# Patient Record
Sex: Male | Born: 1935 | Race: White | Hispanic: No | Marital: Married | Smoking: Former smoker
Health system: Southern US, Community
[De-identification: ages and names within clinical notes are randomized; demographics above are authoritative.]

## PROBLEM LIST (undated history)

## (undated) DIAGNOSIS — R42 Dizziness and giddiness: Secondary | ICD-10-CM

## (undated) DIAGNOSIS — E785 Hyperlipidemia, unspecified: Secondary | ICD-10-CM

## (undated) DIAGNOSIS — C801 Malignant (primary) neoplasm, unspecified: Secondary | ICD-10-CM

## (undated) DIAGNOSIS — I2510 Atherosclerotic heart disease of native coronary artery without angina pectoris: Secondary | ICD-10-CM

## (undated) DIAGNOSIS — I6529 Occlusion and stenosis of unspecified carotid artery: Secondary | ICD-10-CM

## (undated) DIAGNOSIS — H409 Unspecified glaucoma: Secondary | ICD-10-CM

## (undated) DIAGNOSIS — G4700 Insomnia, unspecified: Secondary | ICD-10-CM

## (undated) DIAGNOSIS — I10 Essential (primary) hypertension: Secondary | ICD-10-CM

## (undated) HISTORY — DX: Essential (primary) hypertension: I10

## (undated) HISTORY — PX: CORONARY ANGIOPLASTY WITH STENT PLACEMENT: SHX49

## (undated) HISTORY — PX: COLONOSCOPY: SHX5424

## (undated) HISTORY — DX: Malignant (primary) neoplasm, unspecified: C80.1

## (undated) HISTORY — DX: Hyperlipidemia, unspecified: E78.5

## (undated) HISTORY — DX: Insomnia, unspecified: G47.00

## (undated) HISTORY — DX: Atherosclerotic heart disease of native coronary artery without angina pectoris: I25.10

## (undated) HISTORY — PX: MELANOMA EXCISION: SHX5266

## (undated) HISTORY — DX: Occlusion and stenosis of unspecified carotid artery: I65.29

---

## 2013-12-02 LAB — TSH: TSH: 1.62 u[IU]/mL (ref 0.41–5.90)

## 2013-12-02 LAB — HEPATIC FUNCTION PANEL
ALT: 15 U/L (ref 10–40)
AST: 15 U/L (ref 14–40)

## 2013-12-02 LAB — BASIC METABOLIC PANEL
Creatinine: 1.4 mg/dL — AB (ref 0.6–1.3)
Glucose: 105 mg/dL
Potassium: 4.9 mmol/L (ref 3.4–5.3)

## 2013-12-02 LAB — CBC AND DIFFERENTIAL: WBC: 7 10^3/mL

## 2013-12-02 LAB — LIPID PANEL
CHOLESTEROL: 160 mg/dL (ref 0–200)
HDL: 33 mg/dL — AB (ref 35–70)
LDL Cholesterol: 79 mg/dL

## 2014-01-04 ENCOUNTER — Encounter: Payer: Self-pay | Admitting: Family Medicine

## 2014-01-04 ENCOUNTER — Ambulatory Visit (INDEPENDENT_AMBULATORY_CARE_PROVIDER_SITE_OTHER): Payer: BC Managed Care – PPO | Admitting: Family Medicine

## 2014-01-04 VITALS — BP 139/69 | HR 60 | Ht 72.0 in | Wt 188.0 lb

## 2014-01-04 DIAGNOSIS — Z23 Encounter for immunization: Secondary | ICD-10-CM

## 2014-01-04 DIAGNOSIS — I150 Renovascular hypertension: Secondary | ICD-10-CM | POA: Insufficient documentation

## 2014-01-04 DIAGNOSIS — I2510 Atherosclerotic heart disease of native coronary artery without angina pectoris: Secondary | ICD-10-CM

## 2014-01-04 DIAGNOSIS — G4700 Insomnia, unspecified: Secondary | ICD-10-CM

## 2014-01-04 DIAGNOSIS — G40909 Epilepsy, unspecified, not intractable, without status epilepticus: Secondary | ICD-10-CM

## 2014-01-04 DIAGNOSIS — I6529 Occlusion and stenosis of unspecified carotid artery: Secondary | ICD-10-CM | POA: Insufficient documentation

## 2014-01-04 DIAGNOSIS — I10 Essential (primary) hypertension: Secondary | ICD-10-CM

## 2014-01-04 DIAGNOSIS — E785 Hyperlipidemia, unspecified: Secondary | ICD-10-CM

## 2014-01-04 HISTORY — DX: Atherosclerotic heart disease of native coronary artery without angina pectoris: I25.10

## 2014-01-04 HISTORY — DX: Insomnia, unspecified: G47.00

## 2014-01-04 HISTORY — DX: Occlusion and stenosis of unspecified carotid artery: I65.29

## 2014-01-04 HISTORY — DX: Hyperlipidemia, unspecified: E78.5

## 2014-01-04 HISTORY — DX: Essential (primary) hypertension: I10

## 2014-01-04 NOTE — Progress Notes (Signed)
CC: Brad James is a 80 y.o. 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 He states he has never had a myocardial infarction. He takes a statin, metoprolol, and Plavix. He denies any exertional chest pain. He most recently saw his cardiologist within the last one to 2 months. Denies limb claudication. He tells that cholesterol was checked this last month and was considered controlled  Reports a history of carotid artery stenosis bilaterally. He tells me within the last month he has had an ultrasound his carotid arteries and he is not considered a surgical candidate. He denies any recent or remote motor or sensory disturbances other than a passing out episode described below  He tells me that sometime in the 1950s he had an episode of passing out a disposition at that time concluded that he suffered from epilepsy. He was started on Dilantin and phenobarbital. It sounds like he has had EEG studies which confirmed epilepsy however years ago his physician stopped Dilantin and he has only been taking phenobarbital without any recent or remote epileptic activity. He tells me that his phenobarbital level was checked within the last month, was therapeutic, and that this is checked annually.  History of insomnia which is currently controlled with as needed use of Ambien without any noted side effects  History of hypertension currently taking metoprolol and lisinopril he tells me that kidney function was checked within the last month he is uncertain about specific results.  Review of Systems - General ROS: negative for - chills, fever, night sweats, weight gain or weight loss Ophthalmic ROS: negative for - decreased vision Psychological ROS: negative for - anxiety or depression ENT ROS: negative for - hearing change, nasal congestion, tinnitus or  allergies Hematological and Lymphatic ROS: negative for - bleeding problems, bruising or swollen lymph nodes Breast ROS: negative Respiratory ROS: no cough, shortness of breath, or wheezing Cardiovascular ROS: no chest pain or dyspnea on exertion Gastrointestinal ROS: no abdominal pain, change in bowel habits, or black or bloody stools Genito-Urinary ROS: negative for - genital discharge, genital ulcers, incontinence or abnormal bleeding from genitals Musculoskeletal ROS: negative for - joint pain or muscle pain Neurological ROS: negative for - headaches or memory loss Dermatological ROS: negative for lumps, mole changes, rash and skin lesion changes  Past Medical History  Diagnosis Date  . Hypertension   . CAD (coronary artery disease) 01/04/2014  . Carotid artery stenosis 01/04/2014  . Essential hypertension, benign 01/04/2014  . Hyperlipidemia 01/04/2014  . Insomnia 01/04/2014    History reviewed. No pertinent past surgical history. History reviewed. No pertinent family history.  History   Social History  . Marital Status: Married    Spouse Name: N/A    Number of Children: N/A  . Years of Education: N/A   Occupational History  . Not on file.   Social History Main Topics  . Smoking status: Former Smoker    Quit date: 01/04/1969  . Smokeless tobacco: Not on file  . Alcohol Use: No  . Drug Use: No  . Sexual Activity: Not Currently    Partners: Female   Other Topics Concern  . Not on file   Social History Narrative  . No narrative on file     Objective: BP 139/69  Pulse 60  Ht 6' (1.829 m)  Wt 188 lb (85.276 kg)  BMI  25.49 kg/m2  General: Alert and Oriented, No Acute Distress HEENT: Pupils equal, round, reactive to light. Conjunctivae clear. Moist mucous membranes pharynx unremarkable Lungs: Clear to auscultation bilaterally, no wheezing/ronchi/rales.  Comfortable work of breathing. Good air movement. Cardiac: Regular rate and rhythm. Normal S1/S2.  No murmurs,  rubs, nor gallops.  Bilateral carotid bruit Extremities: No peripheral edema.  Strong peripheral pulses.  Mental Status: No depression, anxiety, nor agitation. Skin: Warm and dry.  Assessment & Plan: Brad James was seen today for establish care.  Diagnoses and associated orders for this visit:  Need for prophylactic vaccination against Streptococcus pneumoniae (pneumococcus) - Pneumococcal conjugate vaccine 13-valent  CAD (coronary artery disease)  Essential hypertension, benign  Hyperlipidemia  Insomnia  Carotid artery stenosis  Epilepsy    Coronary artery disease: Stable, continue Plavix, aspirin, statin, beta blocker. Essential hypertension: Controlled continue beta blocker and amlodipine Hyperlipidemia: Clinically controlled, we have requested his most recent cholesterol panel that he believes was done within the last 2 months. Insomnia: Controlled continue as needed Ambien Epilepsy: Controlled continue phenobarbital, we've requested records of his most recent phenobarbital level that he believes was checked 2 months ago Carotid artery stenosis: Stable continue aspirin and Plavix, requesting outside records of most recent carotid ultrasound   Return in about 3 months (around 04/05/2014).

## 2014-02-22 ENCOUNTER — Encounter: Payer: Self-pay | Admitting: Family Medicine

## 2014-02-22 DIAGNOSIS — K551 Chronic vascular disorders of intestine: Secondary | ICD-10-CM | POA: Insufficient documentation

## 2014-02-22 DIAGNOSIS — Z85828 Personal history of other malignant neoplasm of skin: Secondary | ICD-10-CM | POA: Insufficient documentation

## 2014-02-22 DIAGNOSIS — I701 Atherosclerosis of renal artery: Secondary | ICD-10-CM | POA: Insufficient documentation

## 2014-02-22 DIAGNOSIS — K861 Other chronic pancreatitis: Secondary | ICD-10-CM | POA: Insufficient documentation

## 2014-02-22 DIAGNOSIS — H401130 Primary open-angle glaucoma, bilateral, stage unspecified: Secondary | ICD-10-CM | POA: Insufficient documentation

## 2014-02-22 DIAGNOSIS — I771 Stricture of artery: Secondary | ICD-10-CM

## 2014-02-24 ENCOUNTER — Encounter: Payer: Self-pay | Admitting: *Deleted

## 2014-02-24 LAB — PSA: PSA: 2.1

## 2014-06-07 ENCOUNTER — Encounter: Payer: Self-pay | Admitting: Family Medicine

## 2014-06-07 ENCOUNTER — Ambulatory Visit (INDEPENDENT_AMBULATORY_CARE_PROVIDER_SITE_OTHER): Payer: BC Managed Care – PPO | Admitting: Family Medicine

## 2014-06-07 VITALS — BP 147/70 | HR 66 | Wt 189.0 lb

## 2014-06-07 DIAGNOSIS — H029 Unspecified disorder of eyelid: Secondary | ICD-10-CM

## 2014-06-07 DIAGNOSIS — L570 Actinic keratosis: Secondary | ICD-10-CM

## 2014-06-07 DIAGNOSIS — L989 Disorder of the skin and subcutaneous tissue, unspecified: Secondary | ICD-10-CM

## 2014-06-07 NOTE — Progress Notes (Signed)
CC: Brad James is a 80 y.o. male is here for freeze lesions   Subjective: HPI:  Complains of multiple skin lesions localized in different parts of his body described below.  Lesions on the right face localized on the cheek that have been present for years he describes them as scabs that never fully healed. No interventions as of yet. Denies unintentional weight loss, swollen lymph nodes, but does have some skin lesions also are described below. Denies fevers, chills.  Complains of a lesion on the inferior right eyelid that has been present for matter of years. It was once partially removed by plastic surgery, he was scheduled to have further resection done however left his former provider's office in [place] prior to having this completed. He reports getting in the way of his vision.  Complains of flaking on the left shin described as a scab but never heals. It is painless. Occasionally bleeds it is rough with it.  Complains of a mass on his right wrist that has been growing over the past 2-3 years. It is painless but if he exposes it to friction and will frequently bleed. It seems to be growing on a annual basis. No interventions as of yet  Complaints of ulceration/mass on his chest just below the That has been present for the past year seems to be enlarging on a monthly basis without any pain it is itchy. No interventions as of yet  Review Of Systems Outlined In HPI  Past Medical History  Diagnosis Date  . Hypertension   . CAD (coronary artery disease) 01/04/2014  . Carotid artery stenosis 01/04/2014  . Essential hypertension, benign 01/04/2014  . Hyperlipidemia 01/04/2014  . Insomnia 01/04/2014    No past surgical history on file. No family history on file.  History   Social History  . Marital Status: Married    Spouse Name: N/A    Number of Children: N/A  . Years of Education: N/A   Occupational History  . Not on file.   Social History Main Topics  . Smoking status:  Former Smoker    Quit date: 01/04/1969  . Smokeless tobacco: Not on file  . Alcohol Use: No  . Drug Use: No  . Sexual Activity: Not Currently    Partners: Female   Other Topics Concern  . Not on file   Social History Narrative  . No narrative on file     Objective: BP 147/70  Pulse 66  Wt 189 lb (85.73 kg)  General: Alert and Oriented, No Acute Distress HEENT: Pupils equal, round, reactive to light. Conjunctivae clear.  Moist membranes pharynx unremarkable Lungs: Clear comfortable work of breathing Cardiac: Regular rate and rhythm.  Extremities: No peripheral edema.  Strong peripheral pulses.  Mental Status: No depression, anxiety, nor agitation. Skin: Warm and dry. On the right cheek he has 4 clusters of actinic keratoses.  On the left shin he also has a half centimeter and a quarter centimeter cluster of actinic keratosis. On the right wrist Radial aspect there is a 1 cm diameter slightly raised red and spongy mass, just above the sternum of the chest there is a half centimeter diameter slightly raised pearly fleshy colored mass   Assessment & Plan: Brad James was seen today for freeze lesions.  Diagnoses and associated orders for this visit:  Eyelid lesion - Ambulatory referral to Plastic Surgery  Actinic keratoses  Skin lesion of chest wall - Dermatology pathology  Arm skin lesion, right - Dermatology pathology  Referral to plastic surgery for further management of the recurrence of what looks to be a basal cell carcinoma on the right lower eyelid. Biopsies above for further evaluation of 2 suspicious lesions, all actinic keratoses were destructed with cryotherapy  Return if symptoms worsen or fail to improve.  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  The lesions and surrounding area were given a sterile prep using chlorhexidine and draped in the usual sterile fashion. A derma blade was used to shave an area of skin approximately 0.5cm by 0.5cm above the chest and the wrist.  Hemostasis achieved with pressure.  a sterile dressing applied.  The specimens were sent for pathologic examination. The patient tolerated the procedure well.  EBL: 3 ml  Findings: unremarkable  Condition: Stable  Complications: none.  Plan: 1. Instructed to keep the wound dry and covered for 24-48h and clean thereafter. 2. Warning signs of infection were reviewed.   3. Recommended that the patient use OTC analgesics as needed for pain.  4. ReturnPRN   Cryotherapy Procedure Note  Pre-operative Diagnosis: Actinic keratosis  Post-operative Diagnosis: Actinic keratosis  Locations: ON the right cheek and left shin  Indications: Destruction of precancerous lesions  Anesthesia: not required    Procedure Details  History of allergy to iodine: no. Pacemaker? no.  Patient informed of risks (permanent scarring, infection, light or dark discoloration, bleeding, infection, weakness, numbness and recurrence of the lesion) and benefits of the procedure and verbal informed consent obtained.  The areas are treated with liquid nitrogen therapy, frozen until ice ball extended 3 mm beyond lesion, allowed to thaw, and treated again. The patient tolerated procedure well.  The patient was instructed on post-op care, warned that there may be blister formation, redness and pain. Recommend OTC analgesia as needed for  pain.  Condition: Stable  Complications: none.  Plan: 1. Instructed to keep the area dry and covered for 24-48h and clean thereafter. 2. Warning signs of infection were reviewed.   3. Recommended that the patient use OTC analgesics as needed for pain.  4. Return PRN

## 2014-06-10 ENCOUNTER — Telehealth: Payer: Self-pay | Admitting: Family Medicine

## 2014-06-10 DIAGNOSIS — D099 Carcinoma in situ, unspecified: Secondary | ICD-10-CM

## 2014-06-10 DIAGNOSIS — C4491 Basal cell carcinoma of skin, unspecified: Secondary | ICD-10-CM

## 2014-06-10 NOTE — Telephone Encounter (Signed)
Seth Bake, Will you please let patient know that his biopsy of the chest and wrist reflected that the spots are a basal cell and squamous cell carcinoma respectively.  I'd recommend that he meet with a dermatologist to discuss whether destruction vs complete excision is the next best course of action.  I've placed a referral for this, if he's not been contacted by next week for scheduling please let me know.

## 2014-06-10 NOTE — Telephone Encounter (Signed)
Left message to call back for results

## 2014-06-11 NOTE — Telephone Encounter (Signed)
Pt.notified

## 2014-07-22 ENCOUNTER — Encounter: Payer: Self-pay | Admitting: Family Medicine

## 2014-07-22 DIAGNOSIS — C4490 Unspecified malignant neoplasm of skin, unspecified: Secondary | ICD-10-CM | POA: Insufficient documentation

## 2014-09-14 ENCOUNTER — Encounter: Payer: Self-pay | Admitting: Family Medicine

## 2014-09-14 ENCOUNTER — Ambulatory Visit (INDEPENDENT_AMBULATORY_CARE_PROVIDER_SITE_OTHER): Payer: BC Managed Care – PPO | Admitting: Family Medicine

## 2014-09-14 VITALS — BP 122/66 | HR 61 | Wt 192.0 lb

## 2014-09-14 DIAGNOSIS — E785 Hyperlipidemia, unspecified: Secondary | ICD-10-CM

## 2014-09-14 DIAGNOSIS — G4700 Insomnia, unspecified: Secondary | ICD-10-CM

## 2014-09-14 DIAGNOSIS — I10 Essential (primary) hypertension: Secondary | ICD-10-CM

## 2014-09-14 DIAGNOSIS — I2510 Atherosclerotic heart disease of native coronary artery without angina pectoris: Secondary | ICD-10-CM

## 2014-09-14 MED ORDER — ZOLPIDEM TARTRATE 10 MG PO TABS: 10.0000 mg | ORAL_TABLET | Freq: Every evening | ORAL | Status: DC | PRN

## 2014-09-14 MED ORDER — METOPROLOL TARTRATE 100 MG PO TABS: 150.0000 mg | ORAL_TABLET | Freq: Every day | ORAL | Status: DC

## 2014-09-14 MED ORDER — LISINOPRIL 20 MG PO TABS: 20.0000 mg | ORAL_TABLET | Freq: Two times a day (BID) | ORAL | Status: DC

## 2014-09-14 NOTE — Progress Notes (Signed)
CC: Brad James is a 80 y.o. male is here for Medication Management   Subjective: HPI:  F/U HTN; Continues on amlodipine, lisinopril, isosorbide, metoprolol.  Blood pressures to report. Denies chest pain shortness of breath orthopnea peripheral edema nor motor or sensory disturbances.  Follow-up hyperlipidemia: Continues on atorvastatin 20 daily basis without myalgias or right upper quadrant pain. It's been about a year since his cholesterol was checked last.  Follow-up insomnia: Requesting refills on Ambien. He takes this most days of the week to help with sleep. Provided he takes this it 100% relieves his difficulty with falling asleep, he still has no difficulty staying asleep regardless of whether or not he is taking Ambien. He denies any side effects or intolerances. No falls since I saw them last  Follow-up coronary artery disease: Continues to take low-dose aspirin and Plavix, beta blocker.  He has nitroglycerin at home to use for chest pain but states he has not used it in the last year and he is only using it once may be in his lifetime it's been so long he can't remember. He stays active playing golf without chest pain or limb claudication   Review Of Systems Outlined In HPI  Past Medical History  Diagnosis Date  . Hypertension   . CAD (coronary artery disease) 01/04/2014  . Carotid artery stenosis 01/04/2014  . Essential hypertension, benign 01/04/2014  . Hyperlipidemia 01/04/2014  . Insomnia 01/04/2014    No past surgical history on file. No family history on file.  History   Social History  . Marital Status: Married    Spouse Name: N/A    Number of Children: N/A  . Years of Education: N/A   Occupational History  . Not on file.   Social History Main Topics  . Smoking status: Former Smoker    Quit date: 01/04/1969  . Smokeless tobacco: Not on file  . Alcohol Use: No  . Drug Use: No  . Sexual Activity:    Partners: Female   Other Topics Concern  . Not on file    Social History Narrative     Objective: BP 122/66 mmHg  Pulse 61  Wt 192 lb (87.091 kg)  General: Alert and Oriented, No Acute Distress HEENT: Pupils equal, round, reactive to light. Conjunctivae clear.  Moist mucous membranes Unremarkable Lungs: Clear to auscultation bilaterally, no wheezing/ronchi/rales.  Comfortable work of breathing. Good air movement. Cardiac: Regular rate and rhythm. Normal S1/S2.  No murmurs, rubs, nor gallops.   Abdomen: Flat and soft Extremities: No peripheral edema.  Strong peripheral pulses.  Mental Status: No depression, anxiety, nor agitation. Skin: Warm and dry.  Assessment & Plan: Brad James was seen today for medication management.  Diagnoses and associated orders for this visit:  Essential hypertension, benign - metoprolol (LOPRESSOR) 100 MG tablet; Take 1.5 tablets (150 mg total) by mouth daily. - lisinopril (PRINIVIL,ZESTRIL) 20 MG tablet; Take 1 tablet (20 mg total) by mouth 2 (two) times daily. - COMPLETE METABOLIC PANEL WITH GFR  Hyperlipidemia - Lipid panel - COMPLETE METABOLIC PANEL WITH GFR  Coronary artery disease involving native coronary artery of native heart without angina pectoris - Lipid panel  Insomnia - zolpidem (AMBIEN) 10 MG tablet; Take 1 tablet (10 mg total) by mouth at bedtime as needed for sleep.    Essential hypertension: Control continue metoprolol and lisinopril, amlodipine and Isordil. Checking renal function today Hyperlipidemia: Continue atorvastatin pending lipid panel and liver enzymes Coronary artery disease: Stable, continue aspirin, Plavix, statin, beta blocker Insomnia:  Controlled on Ambien.  Return in about 3 months (around 12/14/2014).

## 2014-09-15 LAB — COMPLETE METABOLIC PANEL WITH GFR
ALBUMIN: 4.1 g/dL (ref 3.5–5.2)
ALT: 15 U/L (ref 0–53)
AST: 19 U/L (ref 0–37)
Alkaline Phosphatase: 71 U/L (ref 39–117)
BUN: 20 mg/dL (ref 6–23)
CO2: 28 mEq/L (ref 19–32)
CREATININE: 1.25 mg/dL (ref 0.50–1.35)
Calcium: 9.4 mg/dL (ref 8.4–10.5)
Chloride: 103 mEq/L (ref 96–112)
GFR, Est African American: 60 mL/min
GFR, Est Non African American: 52 mL/min — ABNORMAL LOW
Glucose, Bld: 101 mg/dL — ABNORMAL HIGH (ref 70–99)
Potassium: 4.3 mEq/L (ref 3.5–5.3)
Sodium: 140 mEq/L (ref 135–145)
Total Bilirubin: 0.4 mg/dL (ref 0.2–1.2)
Total Protein: 6.9 g/dL (ref 6.0–8.3)

## 2014-09-15 LAB — LIPID PANEL
Cholesterol: 166 mg/dL (ref 0–200)
HDL: 37 mg/dL — ABNORMAL LOW (ref >39)
LDL Cholesterol: 81 mg/dL (ref 0–99)
Total CHOL/HDL Ratio: 4.5 Ratio
Triglycerides: 240 mg/dL — ABNORMAL HIGH (ref <150)
VLDL: 48 mg/dL — ABNORMAL HIGH (ref 0–40)

## 2014-09-23 ENCOUNTER — Telehealth: Payer: Self-pay | Admitting: Family Medicine

## 2014-09-23 NOTE — Telephone Encounter (Signed)
Brad James, Can you please confirm with patient that he has not had a new coronary artery stent placed within the last 12 months? I'm filling out a clearance form for his dermatology surgery.

## 2014-09-24 NOTE — Telephone Encounter (Signed)
Left message on pt.'s vm.

## 2014-09-24 NOTE — Telephone Encounter (Signed)
Pt has not had a coronary artery stent placed in the last 12 months

## 2014-09-24 NOTE — Telephone Encounter (Signed)
Perfect, Will you please let the patient know that I'm sending Dr. Vena Rua dermatology office a surgical clearance form advising that Brad James stop taking aspirin and plavix five days prior to his right lower lid surgery and to resume both of these one day after the surgery provided no continued bleeding.

## 2014-09-30 ENCOUNTER — Telehealth: Payer: Self-pay | Admitting: Family Medicine

## 2014-09-30 DIAGNOSIS — H4011X0 Primary open-angle glaucoma, stage unspecified: Secondary | ICD-10-CM

## 2014-09-30 MED ORDER — AMLODIPINE BESYLATE 10 MG PO TABS: 10.0000 mg | ORAL_TABLET | Freq: Every day | ORAL | Status: DC

## 2014-09-30 MED ORDER — CLOPIDOGREL BISULFATE 75 MG PO TABS: 75.0000 mg | ORAL_TABLET | Freq: Every day | ORAL | Status: DC

## 2014-09-30 NOTE — Telephone Encounter (Signed)
Patient came by and needs to refills sent to [facility].  Clopidogrel ? disolfate 75 mg Amlodipine desylate 10 mg  Also wants [place] to refer him to eye doctor for glaucoma.  thanks

## 2014-09-30 NOTE — Telephone Encounter (Signed)
Although we have not refilled those medications before pt was just seen in Dec and progress notes say to continue amlodipine and Plavix so will send in refill. Referral for ophthalmologist sent

## 2014-10-07 ENCOUNTER — Telehealth: Payer: Self-pay | Admitting: *Deleted

## 2014-10-07 NOTE — Telephone Encounter (Signed)
Prior auth initiated for zolpidem - awaiting decision

## 2014-10-11 ENCOUNTER — Encounter: Payer: Self-pay | Admitting: Family Medicine

## 2014-10-11 ENCOUNTER — Ambulatory Visit (INDEPENDENT_AMBULATORY_CARE_PROVIDER_SITE_OTHER): Payer: Medicare HMO | Admitting: Family Medicine

## 2014-10-11 VITALS — BP 161/83 | HR 74 | Wt 191.0 lb

## 2014-10-11 DIAGNOSIS — R040 Epistaxis: Secondary | ICD-10-CM

## 2014-10-11 NOTE — Progress Notes (Signed)
CC: Brad James is a 81 y.o. male is here for Epistaxis   Subjective: HPI:   45 minutes prior to arrival he reports he was doing odd jobs around his house and had spontaneous bleeding from the inside of the left nostril and right lateral aspect of the nose externally. Symptoms improved within a few minutes and bleeding was controlled with pressure and cotton balls. Denies bleeding issues elsewhere. He's been using a new nasal product to help with when he reports as stuffiness. He's had problems with this product in the past causing nasal bleeding, he is uncertain of the name of it at the present time. Denies shortness of breath, chest pain, rapid heartbeat, nor skin issues elsewhere.   Review Of Systems Outlined In HPI  Past Medical History  Diagnosis Date  . Hypertension   . CAD (coronary artery disease) 01/04/2014  . Carotid artery stenosis 01/04/2014  . Essential hypertension, benign 01/04/2014  . Hyperlipidemia 01/04/2014  . Insomnia 01/04/2014    No past surgical history on file. No family history on file.  History   Social History  . Marital Status: Married    Spouse Name: N/A    Number of Children: N/A  . Years of Education: N/A   Occupational History  . Not on file.   Social History Main Topics  . Smoking status: Former Smoker    Quit date: 01/04/1969  . Smokeless tobacco: Not on file  . Alcohol Use: No  . Drug Use: No  . Sexual Activity:    Partners: Female   Other Topics Concern  . Not on file   Social History Narrative     Objective: BP 161/83 mmHg  Pulse 74  Wt 191 lb (86.637 kg)  General: Alert and Oriented, No Acute Distress HEENT: Pupils equal, round, reactive to light. Conjunctivae clear.  External ears unremarkable, canals clear with intact TMs with appropriate landmarks.  Middle ear appears open without effusion. Pink inferior turbinates without any evidence of active bleeding or clotting inside either nostril.  Moist mucous membranes, pharynx  without inflammation nor lesions.  Neck supple without palpable lymphadenopathy nor abnormal masses. Right lateral aspect of the nose at the bridge there is a 3 mm x 10 mm scab/clot. When this was removed shows a 1 mm ulceration that is slowly bleeding. Extremities: No peripheral edema.  Strong peripheral pulses.  Mental Status: No depression, anxiety, nor agitation. Skin: Warm and dry.  Assessment & Plan: Brownie was seen today for epistaxis.  Diagnoses and associated orders for this visit:  Epistaxis   Epistaxis: I couldn't visualize any lesions in the left nostril/nasal passage, fortunately no signs of active bleeding. For the ulceration on the outside of his nose this was easily cauterized with silver nitrate.he was provided with a few sticks to take home with instructions on how to use it for any return of the bleeding of this external lesion but specifically advised not to use this for other lesions or bleeding/lesions within the nose.  Return if symptoms worsen or fail to improve.

## 2014-10-22 ENCOUNTER — Telehealth: Payer: Self-pay

## 2014-10-22 NOTE — Telephone Encounter (Signed)
Resent [facility] today through cover my meds waiting on auth - CF

## 2014-10-26 ENCOUNTER — Other Ambulatory Visit: Payer: Self-pay

## 2014-10-26 DIAGNOSIS — G4700 Insomnia, unspecified: Secondary | ICD-10-CM

## 2014-10-26 MED ORDER — ZOLPIDEM TARTRATE 10 MG PO TABS: 10.0000 mg | ORAL_TABLET | Freq: Every evening | ORAL | Status: DC | PRN

## 2014-11-09 ENCOUNTER — Encounter: Payer: Self-pay | Admitting: Family Medicine

## 2014-11-09 ENCOUNTER — Ambulatory Visit (INDEPENDENT_AMBULATORY_CARE_PROVIDER_SITE_OTHER): Payer: Medicare HMO | Admitting: Family Medicine

## 2014-11-09 ENCOUNTER — Ambulatory Visit (INDEPENDENT_AMBULATORY_CARE_PROVIDER_SITE_OTHER): Payer: Medicare HMO

## 2014-11-09 VITALS — BP 105/66 | HR 62 | Ht 72.0 in | Wt 193.0 lb

## 2014-11-09 DIAGNOSIS — M7061 Trochanteric bursitis, right hip: Secondary | ICD-10-CM

## 2014-11-09 DIAGNOSIS — M25511 Pain in right shoulder: Secondary | ICD-10-CM

## 2014-11-09 MED ORDER — POLYVINYL ALCOHOL-POVIDONE 1.4-0.6 % OP SOLN: 1.0000 [drp] | Freq: Two times a day (BID) | OPHTHALMIC | Status: DC

## 2014-11-09 MED ORDER — PHENOBARBITAL 32.4 MG PO TABS: 64.8000 mg | ORAL_TABLET | Freq: Every day | ORAL | Status: DC

## 2014-11-09 MED ORDER — MELOXICAM 15 MG PO TABS: 15.0000 mg | ORAL_TABLET | Freq: Every day | ORAL | Status: DC | PRN

## 2014-11-09 MED ORDER — ATORVASTATIN CALCIUM 40 MG PO TABS: 40.0000 mg | ORAL_TABLET | Freq: Every day | ORAL | Status: DC

## 2014-11-09 NOTE — Progress Notes (Signed)
CC: Brad James is a 81 y.o. male is here for Shoulder Pain and Hip Pain   Subjective: HPI:  Right shoulder pain that has been present for 2 weeks symptoms are moderate to severe in severity. Worse when rolling over on the right shoulder while sleeping. Worse when playing golf. Pain occurs whenever he raises his arm above 90 forward or to the side. No benefit from Tylenol. No recent or remote injury. Pain radiates down the back of the arm near the elbow. Pain is localized in the back of the shoulder. He has chronic neck pain but it does not seem to be behaving as if these 2 sites of pain are linked. No other interventions other than that above. Denies swelling redness or warmth or any motor or sensory disturbances in the upper extremity other than that described above  Implants of right hip pain that has also been present for 2 weeks. Localizing the lateral surface of the hip nonradiating. Sharp and worse when lying on the right side or after periods of inactivity for the first few seconds of motion. He's never had this before. No interventions as of yet. Denies any overlying skin changes  Follow-up insomnia: Requesting refills on phenobarbital which he takes on most nights of the week to help stay asleep and get to sleep. He's been taking this for matter of years. Denies any known side effects  Requesting refills on atorvastatin without any right upper quadrant pain or myalgias.   Review Of Systems Outlined In HPI  Past Medical History  Diagnosis Date  . Hypertension   . CAD (coronary artery disease) 01/04/2014  . Carotid artery stenosis 01/04/2014  . Essential hypertension, benign 01/04/2014  . Hyperlipidemia 01/04/2014  . Insomnia 01/04/2014    No past surgical history on file. No family history on file.  History   Social History  . Marital Status: Married    Spouse Name: N/A  . Number of Children: N/A  . Years of Education: N/A   Occupational History  . Not on file.   Social  History Main Topics  . Smoking status: Former Smoker    Quit date: 01/04/1969  . Smokeless tobacco: Not on file  . Alcohol Use: No  . Drug Use: No  . Sexual Activity:    Partners: Female   Other Topics Concern  . Not on file   Social History Narrative     Objective: BP 105/66 mmHg  Pulse 62  Ht 6' (1.829 m)  Wt 193 lb (87.544 kg)  BMI 26.17 kg/m2  General: Alert and Oriented, No Acute Distress HEENT: Pupils equal, round, reactive to light. Conjunctivae clear.  Moist mucous membranes Lungs: Clear comfortable work of breathing Cardiac: Regular rate and rhythm.  Right shoulder exam reveals full range of motion and strength in all planes of motion and with individual rotator cuff testing. No overlying redness warmth or swelling.  Neer's test negative.  Hawkins test positive. Empty can negative. Crossarm test negative. O'Brien's test positive. Apprehension test negative. Speed's test negative. Extremities: No peripheral edema.  Strong peripheral pulses.  Mental Status: No depression, anxiety, nor agitation. Skin: Warm and dry.  Assessment & Plan: Tavius was seen today for shoulder pain and hip pain.  Diagnoses and all orders for this visit:  Right shoulder pain Orders: -     DG Shoulder Right; Future  Trochanteric bursitis of right hip Orders: -     meloxicam (MOBIC) 15 MG tablet; Take 1 tablet (15 mg total) by mouth  daily as needed for pain.  Other orders -     atorvastatin (LIPITOR) 40 MG tablet; Take 1 tablet (40 mg total) by mouth daily. -     PHENobarbital (LUMINAL) 32.4 MG tablet; Take 2 tablets (64.8 mg total) by mouth at bedtime. -     polyvinyl alcohol-povidone (HYPOTEARS) 1.4-0.6 % ophthalmic solution; Place 1 drop into both eyes 2 (two) times daily.   Right shoulder pain: I'm not entirely sure what is causing his pain at this point. I like to get a x-ray of the acromioclavicular and glenohumeral joint. Differential includes cervical radiculopathy, labral  tear, impingement or arthritis of the above joints. The plan will be based on the results of the x-ray. Start meloxicam for pain Right trochanteric bursitis: He was given a home rehabilitative exercise and a injection into the bursa was performed today.    refills for atorvastatin, phenobarbital for his controlled insomnia and glaucoma medication until he can with ophthalmology next month  Verbal consent was obtained after discussing risks and benefits of injection. With the patient lying on his left side the skin over his right greater trochanter was cleaned with alcohol. The skin was then anesthetized with cold topical spray. Using a 23-gauge needle to 2 mL 1% lidocaine and 2 mL of Kenalog at a concentration of 40 mg/mL were injected into the bursa after aspiration showed no entry into a blood vessel. No complications, patient tolerated procedure well. No bleeding. The site of injection was then covered with a Band-Aid.    Return if symptoms worsen or fail to improve.

## 2014-11-10 ENCOUNTER — Telehealth: Payer: Self-pay | Admitting: Family Medicine

## 2014-11-10 MED ORDER — PREDNISONE 20 MG PO TABS: ORAL_TABLET | ORAL | Status: AC

## 2014-11-10 MED ORDER — BRINZOLAMIDE 1 % OP SUSP: 1.0000 [drp] | Freq: Two times a day (BID) | OPHTHALMIC | Status: AC

## 2014-11-10 NOTE — Telephone Encounter (Signed)
Patient states he did not receive the brinxolamide Azopt drops for his glaucoma.   Patient advised of results and recommendations.

## 2014-11-10 NOTE — Telephone Encounter (Signed)
Seth Bake, Will you please let patient know that his xray did not show any bone or joint abnormalities, I'm suspicious that his pain could be coming from a pinched nerve and I'd recommend that he try a taper regimen of prednisone that I've sent to his [facility].  If this does not help after one week of taking this medication then I'd recommend he ask the front desk for an appt with Dr. Darene Lamer for further eval in our sports medicine clinic.

## 2014-11-10 NOTE — Telephone Encounter (Signed)
Rx was just now sent to [facility]

## 2014-11-11 NOTE — Telephone Encounter (Signed)
Pt aware.

## 2014-11-17 ENCOUNTER — Telehealth: Payer: Self-pay

## 2014-11-17 NOTE — Telephone Encounter (Signed)
Spoke with Holland Falling and the Zolpidem has been approved until 09/17/2015 PA# [id number]. - CF

## 2015-01-31 ENCOUNTER — Other Ambulatory Visit: Payer: Self-pay | Admitting: Family Medicine

## 2015-02-09 ENCOUNTER — Other Ambulatory Visit: Payer: Self-pay | Admitting: Family Medicine

## 2015-02-22 ENCOUNTER — Ambulatory Visit (INDEPENDENT_AMBULATORY_CARE_PROVIDER_SITE_OTHER): Payer: Medicare HMO | Admitting: Family Medicine

## 2015-02-22 ENCOUNTER — Encounter: Payer: Self-pay | Admitting: Family Medicine

## 2015-02-22 VITALS — BP 124/68 | HR 53 | Wt 192.0 lb

## 2015-02-22 DIAGNOSIS — I2510 Atherosclerotic heart disease of native coronary artery without angina pectoris: Secondary | ICD-10-CM | POA: Diagnosis not present

## 2015-02-22 DIAGNOSIS — I10 Essential (primary) hypertension: Secondary | ICD-10-CM | POA: Diagnosis not present

## 2015-02-22 MED ORDER — CLOPIDOGREL BISULFATE 75 MG PO TABS: ORAL_TABLET | ORAL | Status: DC

## 2015-02-22 MED ORDER — METOPROLOL TARTRATE 100 MG PO TABS: 150.0000 mg | ORAL_TABLET | Freq: Every day | ORAL | Status: DC

## 2015-02-22 MED ORDER — AMLODIPINE BESYLATE 10 MG PO TABS: 10.0000 mg | ORAL_TABLET | Freq: Every day | ORAL | Status: DC

## 2015-02-22 MED ORDER — ATORVASTATIN CALCIUM 40 MG PO TABS: 40.0000 mg | ORAL_TABLET | Freq: Every day | ORAL | Status: DC

## 2015-02-22 MED ORDER — LISINOPRIL 20 MG PO TABS: 20.0000 mg | ORAL_TABLET | Freq: Every day | ORAL | Status: DC

## 2015-02-22 NOTE — Progress Notes (Signed)
CC: Brad James is a 81 y.o. male is here for Medication Management   Subjective: HPI:  Lightheadedness that occurs every morning for 2 hours. This been going on for a few weeks on a daily basis except for when he did not take lisinopril by accident earlier this week. He noticed that he had no lightheadedness whatsoever. He returned taking the lisinopril every morning and the lightheadedness has returned. It's described as a dizziness and unsteadiness or he feels like he is about to fall. Symptoms wear off as the day progresses. Nothing else seems to make it better or worse. He denies any other motor or sensory disturbances. No outside blood pressures to report. Denies chest pain shortness of breath orthopnea nor peripheral edema   Review Of Systems Outlined In HPI  Past Medical History  Diagnosis Date  . Hypertension   . CAD (coronary artery disease) 01/04/2014  . Carotid artery stenosis 01/04/2014  . Essential hypertension, benign 01/04/2014  . Hyperlipidemia 01/04/2014  . Insomnia 01/04/2014    No past surgical history on file. No family history on file.  History   Social History  . Marital Status: Married    Spouse Name: N/A  . Number of Children: N/A  . Years of Education: N/A   Occupational History  . Not on file.   Social History Main Topics  . Smoking status: Former Smoker    Quit date: 01/04/1969  . Smokeless tobacco: Not on file  . Alcohol Use: No  . Drug Use: No  . Sexual Activity:    Partners: Female   Other Topics Concern  . Not on file   Social History Narrative     Objective: BP 124/68 mmHg  Pulse 53  Wt 192 lb (87.091 kg)  General: Alert and Oriented, No Acute Distress HEENT: Pupils equal, round, reactive to light. Conjunctivae clear.  Moist membranes Lungs: Clear to auscultation bilaterally, no wheezing/ronchi/rales.  Comfortable work of breathing. Good air movement. Cardiac: Regular rate and rhythm. Normal S1/S2.  No murmurs, rubs, nor gallops.    Extremities: No peripheral edema.  Strong peripheral pulses.  Mental Status: No depression, anxiety, nor agitation. Skin: Warm and dry.  Assessment & Plan: Brad James was seen today for medication management.  Diagnoses and all orders for this visit:  Essential hypertension, benign Orders: -     lisinopril (PRINIVIL,ZESTRIL) 20 MG tablet; Take 1 tablet (20 mg total) by mouth at bedtime. -     metoprolol (LOPRESSOR) 100 MG tablet; Take 1.5 tablets (150 mg total) by mouth daily.  Coronary artery disease involving native coronary artery of native heart without angina pectoris  Other orders -     clopidogrel (PLAVIX) 75 MG tablet; TAKE ONE TABLET BY MOUTH WITH BREAKFAST -     amLODipine (NORVASC) 10 MG tablet; Take 1 tablet (10 mg total) by mouth daily. -     atorvastatin (LIPITOR) 40 MG tablet; Take 1 tablet (40 mg total) by mouth daily.   essential hypertension: I suspect his lightheadedness is due to his antihypertensives regimen being too aggressive. Continue to take nighttime dose of lisinopril but discontinue morning dose of lisinopril. If lightheadedness persists follow-up as soon as possible Coronary artery disease: Controlled continue statin and Plavix   Return in about 3 months (around 05/25/2015).

## 2015-03-11 ENCOUNTER — Other Ambulatory Visit: Payer: Self-pay | Admitting: Family Medicine

## 2015-04-07 ENCOUNTER — Emergency Department (INDEPENDENT_AMBULATORY_CARE_PROVIDER_SITE_OTHER): Payer: Medicare HMO

## 2015-04-07 ENCOUNTER — Emergency Department
Admission: EM | Admit: 2015-04-07 | Discharge: 2015-04-07 | Disposition: A | Discharge status: Home / Self Care | Payer: Medicare HMO | Source: Home / Self Care | Attending: Emergency Medicine | Admitting: Emergency Medicine

## 2015-04-07 ENCOUNTER — Encounter: Payer: Self-pay | Admitting: *Deleted

## 2015-04-07 ENCOUNTER — Other Ambulatory Visit: Payer: Self-pay | Admitting: Family Medicine

## 2015-04-07 DIAGNOSIS — M79642 Pain in left hand: Secondary | ICD-10-CM

## 2015-04-07 DIAGNOSIS — M779 Enthesopathy, unspecified: Secondary | ICD-10-CM

## 2015-04-07 DIAGNOSIS — M778 Other enthesopathies, not elsewhere classified: Secondary | ICD-10-CM

## 2015-04-07 NOTE — ED Provider Notes (Signed)
CSN: [id number]     Arrival date & time 04/07/15  1415 History   First MD Initiated Contact with Patient 04/07/15 1514     Chief Complaint  Patient presents with  . Hand Pain   (Consider location/radiation/quality/duration/timing/severity/associated sxs/prior Treatment) HPI Pt c/o left hand and 5th finger pain without injury x yesterday. Swelling to 5th finger started today. He reports he plays a lot of golf.  Last time he could Mikey Bussing was one week ago. Does not recall any direct trauma otherwise. Here with wife. No definite numbness or focal neurologic symptoms. Denies chest pain or shortness of breath or nausea or vomiting or fever or chills Denies any new elbow or shoulder pain Has not tried any particular treatment Past Medical History  Diagnosis Date  . Hypertension   . CAD (coronary artery disease) 01/04/2014  . Carotid artery stenosis 01/04/2014  . Essential hypertension, benign 01/04/2014  . Hyperlipidemia 01/04/2014  . Insomnia 01/04/2014   History reviewed. No pertinent past surgical history. History reviewed. No pertinent family history. History  Substance Use Topics  . Smoking status: Former Smoker    Quit date: 01/04/1969  . Smokeless tobacco: Never Used  . Alcohol Use: No    Review of Systems Remainder of Review of Systems negative for acute change except as noted in the HPI.  Allergies  Review of patient's allergies indicates no known allergies.  Home Medications   Prior to Admission medications   Medication Sig Start Date End Date Taking? Authorizing Provider  amLODipine (NORVASC) 10 MG tablet Take 1 tablet (10 mg total) by mouth daily. 02/22/15   Marcial Pacas, DO  aspirin 81 MG tablet Take 81 mg by mouth daily.    Historical Provider, MD  atorvastatin (LIPITOR) 40 MG tablet Take 1 tablet (40 mg total) by mouth daily. 02/22/15   Sean Hommel, DO  brinzolamide (AZOPT) 1 % ophthalmic suspension Place 1 drop into both eyes 2 (two) times daily. 11/10/14   Marcial Pacas,  DO  clopidogrel (PLAVIX) 75 MG tablet TAKE ONE TABLET BY MOUTH WITH BREAKFAST 02/22/15   Sean Hommel, DO  isosorbide dinitrate (ISORDIL) 30 MG tablet Take 30 mg by mouth 4 (four) times daily.    Historical Provider, MD  lisinopril (PRINIVIL,ZESTRIL) 20 MG tablet Take 1 tablet (20 mg total) by mouth at bedtime. 02/22/15   Marcial Pacas, DO  metoprolol (LOPRESSOR) 100 MG tablet Take 1.5 tablets (150 mg total) by mouth daily. 02/22/15   Sean Hommel, DO  PHENobarbital (LUMINAL) 32.4 MG tablet TAKE TWO TABLETS BY MOUTH AT BEDTIME 04/07/15   Hali Marry, MD  zolpidem (AMBIEN) 10 MG tablet Take 1 tablet (10 mg total) by mouth at bedtime as needed for sleep. 10/26/14   Sean Hommel, DO   BP 149/74 mmHg  Pulse 68  Resp 14  Ht 6' (1.829 m)  Wt 192 lb (87.091 kg)  BMI 26.03 kg/m2  SpO2 93% Physical Exam  Constitutional: He is oriented to person, place, and time. He appears well-developed and well-nourished. No distress.  HENT:  Head: Normocephalic and atraumatic.  Eyes: Conjunctivae and EOM are normal. Pupils are equal, round, and reactive to light. No scleral icterus.  Neck: Normal range of motion.  Cardiovascular: Normal rate.   Pulmonary/Chest: Effort normal.  Abdominal: He exhibits no distension.  Musculoskeletal: Normal range of motion.  Neurological: He is alert and oriented to person, place, and time.  Skin: Skin is warm.  Psychiatric: He has a normal mood and affect.  Nursing note  and vitals reviewed.  left hand swollen and tender and indurated tendons of fourth and fifth fingers with mild contractures and osteoarthritic deformities, especially DIPs No heat or redness or open wound or pustules or sign of infection. Neurovascular distally intact. Decreased range of motion fourth and fifth fingers associated with the contractures.  ED Course  Procedures (including critical care time) Labs Review Labs Reviewed - No data to display  Imaging Review Dg Hand Complete Left  04/07/2015    CLINICAL DATA:  Left hand pain and swelling without reported injury.  EXAM: LEFT HAND - COMPLETE 3+ VIEW  COMPARISON:  None.  FINDINGS: There is no evidence of fracture or dislocation. There is significant narrowing and osteophyte formation involving the distal interphalangeal joints consistent with osteoarthritis. Soft tissues are unremarkable.  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 No fracture or dislocation is noted.   Electronically Signed   By: Marijo Conception, M.D.   On: 04/07/2015 15:41     MDM   1. Left hand pain   2. Tendinitis of left hand    osteoarthritis of DIPs  Treatment options discussed. Ace bandage applied with relieved some of the pain Keep elevated. Other nonpharmacologic measures discussed He has a prescription for meloxicam from 6 months ago from a prior shoulder issue, and he prefers to use that meloxicam. Follow-up with PCP or sports medicine within one week. Red flags discussed     Jacqulyn Cane, MD 04/07/15 [phone]

## 2015-04-07 NOTE — ED Notes (Signed)
Pt c/o left hand and 5th finger pain without injury x yesterday. Swelling to 5th finger started today. He reports he plays a lot of golf.

## 2015-04-18 ENCOUNTER — Encounter: Payer: Self-pay | Admitting: Family Medicine

## 2015-04-18 ENCOUNTER — Ambulatory Visit (INDEPENDENT_AMBULATORY_CARE_PROVIDER_SITE_OTHER): Payer: Medicare HMO | Admitting: Family Medicine

## 2015-04-18 VITALS — BP 183/84 | HR 61 | Wt 192.0 lb

## 2015-04-18 DIAGNOSIS — M1A9XX1 Chronic gout, unspecified, with tophus (tophi): Secondary | ICD-10-CM | POA: Diagnosis not present

## 2015-04-18 DIAGNOSIS — M109 Gout, unspecified: Secondary | ICD-10-CM | POA: Insufficient documentation

## 2015-04-18 MED ORDER — PHENOBARBITAL 32.4 MG PO TABS: 64.8000 mg | ORAL_TABLET | Freq: Every day | ORAL | Status: DC

## 2015-04-18 MED ORDER — PREDNISONE 20 MG PO TABS: ORAL_TABLET | ORAL | Status: AC

## 2015-04-18 NOTE — Progress Notes (Signed)
CC: Brad James is a 81 y.o. male is here for swollen finger   Subjective: HPI:  Left fifth digit pain localized at the distal joint that has been present for the past 2-3 weeks. It came on acutely was extremely painful tender and red. It's been improving with meloxicam daily. He was told that he had tendinitis when he was seen at a urgent care center. Over the last 2 or 3 days he's noticed some yellow spots come up on the palmar surface of this joint. No other interventions as of yet. Pain is only mild in severity now. Nothing particularly makes it better or worse. Denies joint pain elsewhere. Denies fevers, chills nor flushing.    Review Of Systems Outlined In HPI  Past Medical History  Diagnosis Date  . Hypertension   . CAD (coronary artery disease) 01/04/2014  . Carotid artery stenosis 01/04/2014  . Essential hypertension, benign 01/04/2014  . Hyperlipidemia 01/04/2014  . Insomnia 01/04/2014    No past surgical history on file. No family history on file.  History   Social History  . Marital Status: Married    Spouse Name: N/A  . Number of Children: N/A  . Years of Education: N/A   Occupational History  . Not on file.   Social History Main Topics  . Smoking status: Former Smoker    Quit date: 01/04/1969  . Smokeless tobacco: Never Used  . Alcohol Use: No  . Drug Use: No  . Sexual Activity:    Partners: Female   Other Topics Concern  . Not on file   Social History Narrative     Objective: BP 183/84 mmHg  Pulse 61  Wt 192 lb (87.091 kg)  Vital signs reviewed. General: Alert and Oriented, No Acute Distress HEENT: Pupils equal, round, reactive to light. Conjunctivae clear.  External ears unremarkable.  Moist mucous membranes. Lungs: Clear and comfortable work of breathing, speaking in full sentences without accessory muscle use. Cardiac: Regular rate and rhythm.  Neuro: CN II-XII grossly intact, gait normal. Extremities: No peripheral edema.  Strong peripheral  pulses. Mild swelling and redness of the DIP on the left hand, fifth digit. 3 tophi on the palmar surface of this joint. Full range of motion and strength Mental Status: No depression, anxiety, nor agitation. Logical though process. Skin: Warm and dry. Assessment & Plan: Brad James was seen today for swollen finger.  Diagnoses and all orders for this visit:  Gout with tophus, unspecified cause, unspecified chronicity, unspecified site Orders: -     predniSONE (DELTASONE) 20 MG tablet; Three tabs daily days 1-3, two tabs daily days 4-6, one tab daily days 7-9, half tab daily days 10-13.  Other orders -     PHENobarbital (LUMINAL) 32.4 MG tablet; Take 2 tablets (64.8 mg total) by mouth at bedtime.   Gout flare: Start prednisone taper, discussed that tophi may or may not resolve on its own. Follow-up sometime in the near future to discuss uric acid lowering medications. He is requesting refills for phenobarbital only months worth of a prescription was given by my partner when I was out of the office.   Return if symptoms worsen or fail to improve.

## 2015-04-28 ENCOUNTER — Other Ambulatory Visit: Payer: Self-pay | Admitting: Family Medicine

## 2015-05-04 ENCOUNTER — Encounter: Payer: Self-pay | Admitting: Family Medicine

## 2015-05-04 ENCOUNTER — Ambulatory Visit (INDEPENDENT_AMBULATORY_CARE_PROVIDER_SITE_OTHER): Payer: Medicare HMO | Admitting: Family Medicine

## 2015-05-04 VITALS — BP 173/81 | HR 69 | Wt 190.0 lb

## 2015-05-04 DIAGNOSIS — L570 Actinic keratosis: Secondary | ICD-10-CM | POA: Diagnosis not present

## 2015-05-04 NOTE — Progress Notes (Signed)
CC: Brad James is a 81 y.o. male is here for freeze skin lesions   Subjective: HPI:  Complains of firm flaking skin spots that he will pick at only to have them return within a few days. They seem to be enlarging slowly. They're painless. They will bleed if he picks at it. He denies any swollen lymph nodes, fevers, chills, nor flushing. Every year he seems to have more numerous lesions. Interventions have included cryotherapy that have gotten rid of similar lesions in the past.   Review Of Systems Outlined In HPI  Past Medical History  Diagnosis Date  . Hypertension   . CAD (coronary artery disease) 01/04/2014  . Carotid artery stenosis 01/04/2014  . Essential hypertension, benign 01/04/2014  . Hyperlipidemia 01/04/2014  . Insomnia 01/04/2014    No past surgical history on file. No family history on file.  Social History   Social History  . Marital Status: Married    Spouse Name: N/A  . Number of Children: N/A  . Years of Education: N/A   Occupational History  . Not on file.   Social History Main Topics  . Smoking status: Former Smoker    Quit date: 01/04/1969  . Smokeless tobacco: Never Used  . Alcohol Use: No  . Drug Use: No  . Sexual Activity:    Partners: Female   Other Topics Concern  . Not on file   Social History Narrative     Objective: BP 173/81 mmHg  Pulse 69  Wt 190 lb (86.183 kg)  Vital signs reviewed. General: Alert and Oriented, No Acute Distress HEENT: Pupils equal, round, reactive to light. Conjunctivae clear.  External ears unremarkable.  Moist mucous membranes. Lungs: Clear and comfortable work of breathing, speaking in full sentences without accessory muscle use. Cardiac: Regular rate and rhythm.  Neuro: CN II-XII grossly intact, gait normal. Extremities: No peripheral edema.  Strong peripheral pulses.  Mental Status: No depression, anxiety, nor agitation. Logical though process. Skin: Warm and dry. Numerous hyperkeratotic white raised  lesions ranging from 1 mm-3 mm in diameter on the right forearm, single lesion on the left nose and right earlobe.  Assessment & Plan: Brad James was seen today for freeze skin lesions.  Diagnoses and all orders for this visit:  Actinic keratoses   Actinic keratosis: Discussed premalignant nature of these lesions. Patient requests cryotherapy which seems reasonable.  Return if symptoms worsen or fail to improve. Cryotherapy Procedure Note  Pre-operative Diagnosis: Actinic keratosis  Post-operative Diagnosis: Actinic keratosis  Locations: Right forearm 12, one on left nose, one on right earlobe.  Indications:premalignant   Anesthesia: none  Procedure Details  History of allergy to iodine: no. Pacemaker? no.  Patient informed of risks (permanent scarring, infection, light or dark discoloration, bleeding, infection, weakness, numbness and recurrence of the lesion) and benefits of the procedure and verbal informed consent obtained.  The areas are treated with liquid nitrogen therapy, frozen until ice ball extended 2 mm beyond lesion, allowed to thaw, and treated again. The patient tolerated procedure well.  The patient was instructed on post-op care, warned that there may be blister formation, redness and pain. Recommend OTC analgesia as needed for pain.  Condition: Stable  Complications: none.  Plan: 1. Instructed to keep the area dry and covered for 24-48h and clean thereafter. 2. Warning signs of infection were reviewed.   3. Recommended that the patient use OTC analgesics as needed for pain.  4. Return PRN

## 2015-05-06 ENCOUNTER — Other Ambulatory Visit: Payer: Self-pay | Admitting: *Deleted

## 2015-05-06 MED ORDER — ATORVASTATIN CALCIUM 40 MG PO TABS: 40.0000 mg | ORAL_TABLET | Freq: Every day | ORAL | Status: DC

## 2015-05-06 MED ORDER — PHENOBARBITAL 32.4 MG PO TABS: 64.8000 mg | ORAL_TABLET | Freq: Every day | ORAL | Status: DC

## 2015-05-27 ENCOUNTER — Other Ambulatory Visit: Payer: Self-pay | Admitting: Family Medicine

## 2015-06-08 ENCOUNTER — Encounter: Payer: Self-pay | Admitting: Family Medicine

## 2015-06-08 DIAGNOSIS — R918 Other nonspecific abnormal finding of lung field: Secondary | ICD-10-CM | POA: Insufficient documentation

## 2015-06-09 ENCOUNTER — Ambulatory Visit (INDEPENDENT_AMBULATORY_CARE_PROVIDER_SITE_OTHER): Payer: Medicare HMO | Admitting: Family Medicine

## 2015-06-09 ENCOUNTER — Encounter: Payer: Self-pay | Admitting: Family Medicine

## 2015-06-09 VITALS — BP 153/64 | HR 59 | Wt 191.0 lb

## 2015-06-09 DIAGNOSIS — I10 Essential (primary) hypertension: Secondary | ICD-10-CM | POA: Diagnosis not present

## 2015-06-09 DIAGNOSIS — L309 Dermatitis, unspecified: Secondary | ICD-10-CM | POA: Diagnosis not present

## 2015-06-09 MED ORDER — LISINOPRIL 40 MG PO TABS: 40.0000 mg | ORAL_TABLET | Freq: Every day | ORAL | Status: DC

## 2015-06-09 MED ORDER — TRIAMCINOLONE ACETONIDE 0.1 % EX CREA: TOPICAL_CREAM | CUTANEOUS | Status: DC

## 2015-06-09 NOTE — Patient Instructions (Signed)
Daily BP Values:                        .

## 2015-06-09 NOTE — Progress Notes (Signed)
CC: Brad James is a 81 y.o. male is here for hospital f/u   Subjective: HPI:  Hospital f/u from 20th of this month for chest pain that was noticed when he woke up around 4:30am, his usual time of waking up.  The pain was described as a pushing sensation in his left chest that was nonradiating. Nothing seemed to make the symptoms better or worse so later that morning he went to a local emergency room. Troponin was negative, EKG was unremarkable, hemoglobin was normal, he did have a mild elevation of blood sugar. CT scan was obtained and showed pulmonary nodules. Pain slowly subsided with IV fluids and nitroglycerin. He follows up today saying that the pain is still slightly there and reproducible when he presses on his left pectoralis muscle just lateral to the left nipple. He denies any exertional component to his pain. Pain is improving with no intervention. His noticed at home the blood pressure has been showing readings in the 540-086 systolic. His taking metoprolol and lisinopril as prescribed. He denies any cough, shortness of breath, wheezing, pain with breathing. Denies any overlying skin changes at the site of discomfort.  He's had some itching on his lower extremities localized to some red and flaky spots that showed up on his legs. They're slowly improving with some over-the-counter hydrocortisone cream but seem to have not gotten better over the past day or 2. Symptoms began earlier this week. Symptoms are mild in severity. He denies fevers, chills or swollen lymph nodes.   Review Of Systems Outlined In HPI  Past Medical History  Diagnosis Date  . Hypertension   . CAD (coronary artery disease) 01/04/2014  . Carotid artery stenosis 01/04/2014  . Essential hypertension, benign 01/04/2014  . Hyperlipidemia 01/04/2014  . Insomnia 01/04/2014    No past surgical history on file. No family history on file.  Social History   Social History  . Marital Status: Married    Spouse Name: N/A   . Number of Children: N/A  . Years of Education: N/A   Occupational History  . Not on file.   Social History Main Topics  . Smoking status: Former Smoker    Quit date: 01/04/1969  . Smokeless tobacco: Never Used  . Alcohol Use: No  . Drug Use: No  . Sexual Activity:    Partners: Female   Other Topics Concern  . Not on file   Social History Narrative     Objective: BP 153/64 mmHg  Pulse 59  Wt 191 lb (86.637 kg)  General: Alert and Oriented, No Acute Distress HEENT: Pupils equal, round, reactive to light. Conjunctivae clear.  Moist mucous membranes pharynx unremarkable Lungs: Clear to auscultation bilaterally, no wheezing/ronchi/rales.  Comfortable work of breathing. Good air movement. Cardiac: Regular rate and rhythm. Normal S1/S2.  No murmurs, rubs, nor gallops.   Extremities: No peripheral edema.  Strong peripheral pulses.  Mental Status: No depression, anxiety, nor agitation. Skin: Warm and dry. Erythematous macules with a eczematous appearance on the lower extremities, mild in severity  Assessment & Plan: Brad James was seen today for hospital f/u.  Diagnoses and all orders for this visit:  Dermatitis -     triamcinolone cream (KENALOG) 0.1 %; Apply to affected areas twice a day for up to two weeks, avoid face.  Essential hypertension, benign -     lisinopril (PRINIVIL,ZESTRIL) 40 MG tablet; Take 1 tablet (40 mg total) by mouth at bedtime. Discontinue 20mg  form.   Dermatitis: Stop hydrocortisone cream and begin  using triamcinolone cream for 2 weeks. Essential hypertension: Uncontrolled chronic condition increasing lisinopril. He was unable to tolerate an evening dose of metoprolol in the past and is heart rate is already mildly bradycardic therefore no change to metoprolol. Pulmonary nodules: A reminder was placed in his medical record that he will need a repeat CT scan of the chest in September 2017  25 minutes spent face-to-face during visit today of which at  least 50% was counseling or coordinating care regarding: 1. Dermatitis   2. Essential hypertension, benign    and reviewing outside records and the presence of the patient   Return if symptoms worsen or fail to improve.

## 2015-06-23 ENCOUNTER — Telehealth: Payer: Self-pay | Admitting: Family Medicine

## 2015-06-23 MED ORDER — LISINOPRIL-HYDROCHLOROTHIAZIDE 20-25 MG PO TABS: 1.0000 | ORAL_TABLET | Freq: Every day | ORAL | Status: DC

## 2015-06-23 NOTE — Telephone Encounter (Signed)
Lisinopril 40 has not helped BP at home compared to Lisinopril 20 therefore switching to new formulation of Lisinopril-HCTZ and asked to drop off BP numbers next week.

## 2015-06-24 ENCOUNTER — Other Ambulatory Visit: Payer: Self-pay | Admitting: Family Medicine

## 2015-07-07 ENCOUNTER — Encounter: Payer: Self-pay | Admitting: Family Medicine

## 2015-07-07 ENCOUNTER — Ambulatory Visit (INDEPENDENT_AMBULATORY_CARE_PROVIDER_SITE_OTHER): Payer: Medicare HMO | Admitting: Family Medicine

## 2015-07-07 ENCOUNTER — Telehealth: Payer: Self-pay | Admitting: Family Medicine

## 2015-07-07 VITALS — BP 127/63 | HR 61 | Wt 190.0 lb

## 2015-07-07 DIAGNOSIS — I10 Essential (primary) hypertension: Secondary | ICD-10-CM | POA: Diagnosis not present

## 2015-07-07 NOTE — Progress Notes (Signed)
CC: Brad James is a 81 y.o. male is here for Hypotension   Subjective: HPI:  Follow-up essential hypertension: Takes a single dose of metoprolol every night, takes amlodipine in the morning. He's been measuring his blood pressure before taking lisinopril-HCTZ and has noticed blood pressures are ranging at a systolic of 42-395 and diastolic 32-02. After taking lisinopril-HCTZ within a few minutes he feels moderately lightheaded and this lasts for a few hours. It is predictable and reproducible after every dose of lisinopril-HCTZ. No other motor or sensory disturbances nor falls. He denies chest pain shortness of breath or confusion. No other changes to diet or exercise regimen.   Review Of Systems Outlined In HPI  Past Medical History  Diagnosis Date  . Hypertension   . CAD (coronary artery disease) 01/04/2014  . Carotid artery stenosis 01/04/2014  . Essential hypertension, benign 01/04/2014  . Hyperlipidemia 01/04/2014  . Insomnia 01/04/2014    No past surgical history on file. No family history on file.  Social History   Social History  . Marital Status: Married    Spouse Name: N/A  . Number of Children: N/A  . Years of Education: N/A   Occupational History  . Not on file.   Social History Main Topics  . Smoking status: Former Smoker    Quit date: 01/04/1969  . Smokeless tobacco: Never Used  . Alcohol Use: No  . Drug Use: No  . Sexual Activity:    Partners: Female   Other Topics Concern  . Not on file   Social History Narrative     Objective: BP 127/63 mmHg  Pulse 61  Wt 190 lb (86.183 kg)  Vital signs reviewed. General: Alert and Oriented, No Acute Distress HEENT: Pupils equal, round, reactive to light. Conjunctivae clear.  External ears unremarkable.  Moist mucous membranes. Lungs: Clear and comfortable work of breathing, speaking in full sentences without accessory muscle use. Cardiac: Regular rate and rhythm.  Neuro: CN II-XII grossly intact, gait  normal. Extremities: No peripheral edema.  Strong peripheral pulses.  Mental Status: No depression, anxiety, nor agitation. Logical though process. Skin: Warm and dry.  Assessment & Plan: Brad James was seen today for hypotension.  Diagnoses and all orders for this visit:  Essential hypertension, benign   Essential hypertension: Treatment is too aggressive right now causing symptomatic hypotension. Stopping amlodipine. Continue lisinopril-HCTZ and metoprolol. I've asked him to keep a blood pressure log for the next week and drop it off in one week and less dizziness is persistent notify me ASAP.   Return for Drop off BP log in one week.[name]

## 2015-07-07 NOTE — Telephone Encounter (Signed)
Error

## 2015-07-22 ENCOUNTER — Other Ambulatory Visit: Payer: Self-pay | Admitting: Sports Medicine

## 2015-07-25 ENCOUNTER — Other Ambulatory Visit: Payer: Self-pay | Admitting: Sports Medicine

## 2015-07-27 ENCOUNTER — Ambulatory Visit: Payer: Medicare HMO | Admitting: Family Medicine

## 2015-07-27 ENCOUNTER — Ambulatory Visit (INDEPENDENT_AMBULATORY_CARE_PROVIDER_SITE_OTHER): Payer: Medicare HMO | Admitting: Family Medicine

## 2015-07-27 ENCOUNTER — Encounter: Payer: Self-pay | Admitting: Family Medicine

## 2015-07-27 VITALS — BP 105/58 | HR 70 | Wt 191.0 lb

## 2015-07-27 DIAGNOSIS — I10 Essential (primary) hypertension: Secondary | ICD-10-CM | POA: Diagnosis not present

## 2015-07-27 NOTE — Progress Notes (Signed)
CC: Brad James is a 81 y.o. male is here for Hypotension and Dizziness   Subjective: HPI:  Follow-up essential hypertension: His intake lisinopril-HCTZ and metoprolol on a daily basis up until 2 weeks ago. He began taking lisinopril-HCTZ only once every 2 or 3 days. He would originally take this in the morning however noticed blood pressures would be somewhere around 80/50 and he would feel extremely lightheaded. He would wait until he had a morning where his blood pressure was at 120/70 or greater before taking his former dose. Blood pressures in the evenings are usually around 130/80. His tolerating metoprolol every night right now. He tells me his lightheaded only if his systolic is below 832. He denies any other motor or sensory disturbances or chest pain.  Review Of Systems Outlined In HPI  Past Medical History  Diagnosis Date  . Hypertension   . CAD (coronary artery disease) 01/04/2014  . Carotid artery stenosis 01/04/2014  . Essential hypertension, benign 01/04/2014  . Hyperlipidemia 01/04/2014  . Insomnia 01/04/2014    No past surgical history on file. No family history on file.  Social History   Social History  . Marital Status: Married    Spouse Name: N/A  . Number of Children: N/A  . Years of Education: N/A   Occupational History  . Not on file.   Social History Main Topics  . Smoking status: Former Smoker    Quit date: 01/04/1969  . Smokeless tobacco: Never Used  . Alcohol Use: No  . Drug Use: No  . Sexual Activity:    Partners: Female   Other Topics Concern  . Not on file   Social History Narrative     Objective: BP 105/58 mmHg  Pulse 70  Wt 191 lb (86.637 kg)  General: Alert and Oriented, No Acute Distress HEENT: Pupils equal, round, reactive to light. Conjunctivae Vital signs reviewed. General: Alert and Oriented, No Acute Distress HEENT: Pupils equal, round, reactive to light. Conjunctivae clear.  External ears unremarkable.  Moist mucous  membranes. Lungs: Clear and comfortable work of breathing, speaking in full sentences without accessory muscle use. Cardiac: Regular rate and rhythm.  Neuro: CN II-XII grossly intact, gait normal. Extremities: No peripheral edema.  Strong peripheral pulses.  Mental Status: No depression, anxiety, nor agitation. Logical though process. Skin: Warm and dry.  Assessment & Plan: Brad James was seen today for hypotension and dizziness.  Diagnoses and all orders for this visit:  Essential hypertension, benign  Essential hypertension: Currently overmedicated with lisinopril-hydrochlorothiazide therefore stopped this and he'll continue with metoprolol every night. If blood pressures in the morning begin to rise I anticipate splitting metoprolol to a morning and evening dose.  Return in about 2 weeks (around 08/10/2015).

## 2015-08-22 ENCOUNTER — Other Ambulatory Visit: Payer: Self-pay | Admitting: Sports Medicine

## 2015-08-22 ENCOUNTER — Ambulatory Visit: Payer: Medicare HMO | Admitting: Family Medicine

## 2015-08-30 ENCOUNTER — Encounter: Payer: Self-pay | Admitting: Family Medicine

## 2015-08-30 ENCOUNTER — Ambulatory Visit (INDEPENDENT_AMBULATORY_CARE_PROVIDER_SITE_OTHER): Payer: Medicare HMO | Admitting: Family Medicine

## 2015-08-30 VITALS — BP 139/69 | HR 64 | Wt 195.0 lb

## 2015-08-30 DIAGNOSIS — I10 Essential (primary) hypertension: Secondary | ICD-10-CM

## 2015-08-30 DIAGNOSIS — I2510 Atherosclerotic heart disease of native coronary artery without angina pectoris: Secondary | ICD-10-CM | POA: Diagnosis not present

## 2015-08-30 DIAGNOSIS — E785 Hyperlipidemia, unspecified: Secondary | ICD-10-CM

## 2015-08-30 DIAGNOSIS — L309 Dermatitis, unspecified: Secondary | ICD-10-CM | POA: Diagnosis not present

## 2015-08-30 MED ORDER — ATORVASTATIN CALCIUM 40 MG PO TABS: 40.0000 mg | ORAL_TABLET | Freq: Every day | ORAL | Status: DC

## 2015-08-30 MED ORDER — SODIUM CHLORIDE 0.9 % IV SOLN
125.0000 mg | Freq: Once | INTRAVENOUS | Status: AC
  Administered 2015-08-30: 130 mg via INTRAMUSCULAR

## 2015-08-30 MED ORDER — PREDNISONE 20 MG PO TABS: ORAL_TABLET | ORAL | Status: AC

## 2015-08-30 NOTE — Progress Notes (Signed)
CC: Brad James is a 81 y.o. male is here for Hypertension and Rash   Subjective: HPI:  He was under the impression that he longer needed to take atorvastatin. He has a history of coronary artery disease and takes an aspirin on a daily basis. Fortunately he has no chest discomfort or limb claudication. Denies any known side effects from atorvastatin.  HTN: Continiues to take a single dose of metoprolol with all outside home BP values ranging from normotensive to pre hypertensive.  No orthopnea nor peripheral edema. Plays golf almost every day for exercise.  Complains of a rash that began on his shins and now has spread to everywhere but the face.  It's red, itchy, and no  Longer responds to triamcinalone cream.  Denies any changes in personal care products.  Denies fevers, chills, shortness of breath. Denies wheezing or swollen lymph nodes.     Review Of Systems Outlined In HPI  Past Medical History  Diagnosis Date  . Hypertension   . CAD (coronary artery disease) 01/04/2014  . Carotid artery stenosis 01/04/2014  . Essential hypertension, benign 01/04/2014  . Hyperlipidemia 01/04/2014  . Insomnia 01/04/2014    No past surgical history on file. No family history on file.  Social History   Social History  . Marital Status: Married    Spouse Name: N/A  . Number of Children: N/A  . Years of Education: N/A   Occupational History  . Not on file.   Social History Main Topics  . Smoking status: Former Smoker    Quit date: 01/04/1969  . Smokeless tobacco: Never Used  . Alcohol Use: No  . Drug Use: No  . Sexual Activity:    Partners: Female   Other Topics Concern  . Not on file   Social History Narrative     Objective: BP 139/69 mmHg  Pulse 64  Wt 195 lb (88.451 kg)  General: Alert and Oriented, No Acute Distress HEENT: Pupils equal, round, reactive to light. Conjunctivae clear. Moist  Mucous membranes  Lungs: Clear to auscultation bilaterally, no  wheezing/ronchi/rales.  Comfortable work of breathing. Good air movement. Cardiac: Regular rate and rhythm. Normal S1/S2.  No murmurs, rubs, nor gallops.   Extremities: No peripheral edema.  Strong peripheral pulses.  Mental Status: No depression, anxiety, nor agitation. Skin: Warm and dry. Mild diffuse maculopapular rash just mildly erythematous involving both upper an dlower extremities.  Assessment & Plan: Niilo was seen today for hypertension and rash.  Diagnoses and all orders for this visit:  Essential hypertension, benign  Hyperlipidemia -     atorvastatin (LIPITOR) 40 MG tablet; Take 1 tablet (40 mg total) by mouth daily. F/u labs in March 2017  Coronary artery disease involving native coronary artery of native heart without angina pectoris  Dermatitis -     predniSONE (DELTASONE) 20 MG tablet; Three tabs daily days 1-3, two tabs daily days 4-6, one tab daily days 7-9, half tab daily days 10-13.   Essential hypertension: Controlled continue metoprolol daily Hyperlipidemia: I have recommended that he restart taking atorvastatin daily and take this indefinitely due to his history of coronary artery disease Dermatitis: Start prednisone taper and he also received a Solu-Medrol shot today. It appears that this is some sort of allergic dermatitis however I'm unsure about what is causing it.  Return in about 3 months (around 11/28/2015) for Cholesterol and Blood Pressure.

## 2015-09-05 DIAGNOSIS — H401121 Primary open-angle glaucoma, left eye, mild stage: Secondary | ICD-10-CM | POA: Diagnosis not present

## 2015-09-05 DIAGNOSIS — H401112 Primary open-angle glaucoma, right eye, moderate stage: Secondary | ICD-10-CM | POA: Diagnosis not present

## 2015-09-08 ENCOUNTER — Other Ambulatory Visit: Payer: Self-pay | Admitting: Family Medicine

## 2015-09-18 ENCOUNTER — Other Ambulatory Visit: Payer: Self-pay | Admitting: Sports Medicine

## 2015-10-02 ENCOUNTER — Other Ambulatory Visit: Payer: Self-pay | Admitting: Family Medicine

## 2015-10-05 DIAGNOSIS — H02831 Dermatochalasis of right upper eyelid: Secondary | ICD-10-CM | POA: Diagnosis not present

## 2015-10-05 DIAGNOSIS — Z961 Presence of intraocular lens: Secondary | ICD-10-CM | POA: Diagnosis not present

## 2015-10-05 DIAGNOSIS — H401112 Primary open-angle glaucoma, right eye, moderate stage: Secondary | ICD-10-CM | POA: Diagnosis not present

## 2015-10-05 DIAGNOSIS — H401121 Primary open-angle glaucoma, left eye, mild stage: Secondary | ICD-10-CM | POA: Diagnosis not present

## 2015-10-05 DIAGNOSIS — H527 Unspecified disorder of refraction: Secondary | ICD-10-CM | POA: Diagnosis not present

## 2015-10-05 DIAGNOSIS — H02834 Dermatochalasis of left upper eyelid: Secondary | ICD-10-CM | POA: Diagnosis not present

## 2015-10-18 ENCOUNTER — Other Ambulatory Visit: Payer: Self-pay | Admitting: Sports Medicine

## 2015-10-18 NOTE — Telephone Encounter (Signed)
Please advise 

## 2015-10-18 NOTE — Telephone Encounter (Signed)
[place], Rx placed in in-box ready for pickup/faxing.  

## 2015-11-08 ENCOUNTER — Encounter: Payer: Self-pay | Admitting: Family Medicine

## 2015-11-08 ENCOUNTER — Ambulatory Visit (INDEPENDENT_AMBULATORY_CARE_PROVIDER_SITE_OTHER): Payer: Medicare HMO | Admitting: Family Medicine

## 2015-11-08 VITALS — BP 172/78 | HR 61 | Wt 191.0 lb

## 2015-11-08 DIAGNOSIS — E785 Hyperlipidemia, unspecified: Secondary | ICD-10-CM

## 2015-11-08 DIAGNOSIS — I10 Essential (primary) hypertension: Secondary | ICD-10-CM | POA: Diagnosis not present

## 2015-11-08 DIAGNOSIS — M542 Cervicalgia: Secondary | ICD-10-CM

## 2015-11-08 DIAGNOSIS — G4700 Insomnia, unspecified: Secondary | ICD-10-CM

## 2015-11-08 MED ORDER — CLOPIDOGREL BISULFATE 75 MG PO TABS: ORAL_TABLET | ORAL | Status: DC

## 2015-11-08 MED ORDER — ATORVASTATIN CALCIUM 40 MG PO TABS: 40.0000 mg | ORAL_TABLET | Freq: Every day | ORAL | Status: DC

## 2015-11-08 MED ORDER — PHENOBARBITAL 32.4 MG PO TABS: 64.8000 mg | ORAL_TABLET | Freq: Every day | ORAL | Status: DC

## 2015-11-08 MED ORDER — ZOLPIDEM TARTRATE 10 MG PO TABS: 10.0000 mg | ORAL_TABLET | Freq: Every evening | ORAL | Status: DC | PRN

## 2015-11-08 MED ORDER — PREDNISONE 20 MG PO TABS: ORAL_TABLET | ORAL | Status: AC

## 2015-11-08 MED ORDER — METOPROLOL TARTRATE 100 MG PO TABS: ORAL_TABLET | ORAL | Status: DC

## 2015-11-08 NOTE — Progress Notes (Signed)
CC: Brad James is a 82 y.o. male is here for Neck Pain and Hypertension   Subjective: HPI:  Requesting refills on atorvastatin. He's taking this daily without any known side effects. He denies right upper quadrant pain or myalgias.  Follow-up insomnia: He is requesting a refill on Ambien. He denies any known side effects and tells me that works great helping him fall asleep and stay asleep. He denies side effects that lingering to the morning.  Follow essential hypertension: He is requesting refills on lisinopril, hydrocodone thiazide, and metoprolol. Has run out of the latter. He denies any known side effects. No outside blood pressures report. No chest pain shortness of breath orthopnea or peripheral edema.  Complains of right-sided neck pain localized just lateral to the [place] apple. Patient present on a daily basis for at least 2-3 weeks now. It's worse when he turns to the right or if his mind is not occupied on something else. Symptoms are mild in severity. He denies any swelling. He denies any difficulty swallowing or dysphagia. No fevers, chills or change in voice   Review Of Systems Outlined In HPI  Past Medical History  Diagnosis Date  . Hypertension   . CAD (coronary artery disease) 01/04/2014  . Carotid artery stenosis 01/04/2014  . Essential hypertension, benign 01/04/2014  . Hyperlipidemia 01/04/2014  . Insomnia 01/04/2014    No past surgical history on file. No family history on file.  Social History   Social History  . Marital Status: Married    Spouse Name: N/A  . Number of Children: N/A  . Years of Education: N/A   Occupational History  . Not on file.   Social History Main Topics  . Smoking status: Former Smoker    Quit date: 01/04/1969  . Smokeless tobacco: Never Used  . Alcohol Use: No  . Drug Use: No  . Sexual Activity:    Partners: Female   Other Topics Concern  . Not on file   Social History Narrative     Objective: BP 172/78 mmHg  Pulse  61  Wt 191 lb (86.637 kg)  General: Alert and Oriented, No Acute Distress HEENT: Pupils equal, round, reactive to light. Conjunctivae clear. Pink inferior turbinates.  Moist mucous membranes, pharynx without inflammation nor lesions.  Neck supple without palpable lymphadenopathy nor abnormal masses. Lungs: Clear to auscultation bilaterally, no wheezing/ronchi/rales.  Comfortable work of breathing. Good air movement. Cardiac: Regular rate and rhythm. Normal S1/S2.  No murmurs, rubs, nor gallops.   Extremities: No peripheral edema.  Strong peripheral pulses.  Mental Status: No depression, anxiety, nor agitation. Skin: Warm and dry.  Assessment & Plan: Brad James was seen today for neck pain and hypertension.  Diagnoses and all orders for this visit:  Hyperlipidemia -     atorvastatin (LIPITOR) 40 MG tablet; Take 1 tablet (40 mg total) by mouth daily. F/u labs in March 2017  Insomnia  Essential hypertension, benign  Neck pain  Other orders -     clopidogrel (PLAVIX) 75 MG tablet; TAKE ONE TABLET BY MOUTH WITH BREAKFAST -     metoprolol (LOPRESSOR) 100 MG tablet; TAKE ONE & ONE-HALF TABLETS BY MOUTH ONCE DAILY -     PHENobarbital (LUMINAL) 32.4 MG tablet; Take 2 tablets (64.8 mg total) by mouth at bedtime. -     zolpidem (AMBIEN) 10 MG tablet; Take 1 tablet (10 mg total) by mouth at bedtime as needed. for sleep -     predniSONE (DELTASONE) 20 MG tablet; Three tabs  daily days 1-3, two tabs daily days 4-6, one tab daily days 7-9, half tab daily days 10-13.   Hyperlipidemia: Clinical controlled, continue atorvastatin Insomnia: Controlled with Ambien continue daily dose. Essential hypertension: Uncontrolled, restart metoprolol, refills on lisinopril-hydrochlorothiazide. Neck pain: No palpable masses on today's exam, start prednisone taper and call if any new symptoms begin.  Return in about 3 months (around 02/05/2016).

## 2015-11-29 ENCOUNTER — Other Ambulatory Visit: Payer: Self-pay | Admitting: Family Medicine

## 2015-12-01 ENCOUNTER — Other Ambulatory Visit: Payer: Self-pay | Admitting: Family Medicine

## 2015-12-02 NOTE — Telephone Encounter (Signed)
Please advise.  Medication was D/C in June of 2016.

## 2015-12-02 NOTE — Telephone Encounter (Signed)
You are correct, refill declined.

## 2016-01-27 ENCOUNTER — Telehealth: Payer: Self-pay | Admitting: Emergency Medicine

## 2016-01-27 ENCOUNTER — Emergency Department
Admission: EM | Admit: 2016-01-27 | Discharge: 2016-01-27 | Disposition: A | Discharge status: Home / Self Care | Payer: Medicare HMO | Source: Home / Self Care | Attending: Emergency Medicine | Admitting: Emergency Medicine

## 2016-01-27 DIAGNOSIS — I10 Essential (primary) hypertension: Secondary | ICD-10-CM | POA: Diagnosis not present

## 2016-01-27 DIAGNOSIS — R040 Epistaxis: Secondary | ICD-10-CM

## 2016-01-27 MED ORDER — CLONIDINE HCL 0.2 MG PO TABS
0.2000 mg | ORAL_TABLET | ORAL | Status: AC
  Administered 2016-01-27: 0.2 mg via ORAL

## 2016-01-27 NOTE — ED Provider Notes (Addendum)
CSN: [id number]     Arrival date & time 01/27/16  [id number] History   First MD Initiated Contact with Patient 01/27/16 0935     Chief Complaint  Patient presents with  . Epistaxis   Patient walked in to [facility] Friday, 01/27/2016 9:16 AM Patient is a 82 y.o. male presenting with nosebleeds. The history is provided by the patient.  Epistaxis Location:  R nare Severity:  Severe Duration:  1 hour Timing:  Constant Progression:  Worsening Chronicity:  New Context: anticoagulants (Plavix and aspirin 81 mg daily) and hypertension   Context: not bleeding disorder, not foreign body, not home oxygen, not recent infection and not trauma   Relieved by: Packing with cotton himself this morning, however the bleeding resumed. Associated symptoms: blood in oropharynx   Associated symptoms: no cough, no fever, no headaches, no sore throat and no syncope   Risk factors: no radiation treatment and no recent nasal surgery    Family history: No known family history of nosebleeds  Past Medical History  Diagnosis Date  . Hypertension   . CAD (coronary artery disease) 01/04/2014  . Carotid artery stenosis 01/04/2014  . Essential hypertension, benign 01/04/2014  . Hyperlipidemia 01/04/2014  . Insomnia 01/04/2014   No past surgical history on file. No family history on file. Social History  Substance Use Topics  . Smoking status: Former Smoker    Quit date: 01/04/1969  . Smokeless tobacco: Never Used  . Alcohol Use: No    Review of Systems  Constitutional: Negative for fever.  HENT: Positive for nosebleeds. Negative for sore throat.   Respiratory: Negative for cough.   Cardiovascular: Negative for syncope.  Neurological: Negative for headaches.  All other systems reviewed and are negative.  denies chest pain, palpitations, lightheadedness, nausea, vomiting,  shortness of breath, headache or any focal neurologic symptoms  Allergies  Review of patient's allergies indicates no known  allergies.  Home Medications   Prior to Admission medications   Medication Sig Start Date End Date Taking? Authorizing Provider  aspirin 81 MG tablet Take 81 mg by mouth daily.    Historical Provider, MD  atorvastatin (LIPITOR) 40 MG tablet Take 1 tablet (40 mg total) by mouth daily. F/u labs in March 2017 11/08/15   Sean Hommel, DO  brinzolamide (AZOPT) 1 % ophthalmic suspension Place 1 drop into both eyes 2 (two) times daily. 11/10/14   Marcial Pacas, DO  clopidogrel (PLAVIX) 75 MG tablet TAKE ONE TABLET BY MOUTH WITH BREAKFAST 11/08/15   Sean Hommel, DO  metoprolol (LOPRESSOR) 100 MG tablet TAKE ONE & ONE-HALF TABLETS BY MOUTH ONCE DAILY 11/08/15   Sean Hommel, DO  PHENobarbital (LUMINAL) 32.4 MG tablet Take 2 tablets (64.8 mg total) by mouth at bedtime. 11/08/15   Marcial Pacas, DO  triamcinolone cream (KENALOG) 0.1 % Apply to affected areas twice a day for up to two weeks, avoid face. 06/09/15 06/08/16  Sean Hommel, DO  zolpidem (AMBIEN) 10 MG tablet Take 1 tablet (10 mg total) by mouth at bedtime as needed. for sleep 11/08/15   Marcial Pacas, DO   Meds Ordered and Administered this Visit   Medications  cloNIDine (CATAPRES) tablet 0.2 mg (0.2 mg Oral Given 01/27/16 1009)    BP 164/80 mmHg  Pulse 78  Temp(Src) 98.2 F (36.8 C) (Oral)  SpO2 98% No data found.   Initial BP 201/109, repeated 201/88, after brief observation again repeated 198/85. Physical Exam  Constitutional: He is oriented to person, place, and time. He  appears well-developed and well-nourished. No distress.  Alert, cooperative male, ambulatory, with active red blood from nosebleed right nares.  HENT:  Head: Normocephalic and atraumatic.  Nose: Epistaxis (Right nares, diffusely right septal aspect) is observed.  No foreign bodies.  Mouth/Throat: No oral lesions.  Oropharynx: Clotted red blood in posterior pharynx, that he was able to cough out.--Oropharynx otherwise within normal limits  Eyes: Conjunctivae and EOM are normal.  Pupils are equal, round, and reactive to light. No scleral icterus.  Neck: Normal range of motion. Neck supple.  Nontender  Cardiovascular: Normal rate, regular rhythm and normal heart sounds.   Pulmonary/Chest: Effort normal and breath sounds normal. He has no decreased breath sounds.  Abdominal: Normal appearance. He exhibits no distension.  Musculoskeletal: Normal range of motion.  Neurological: He is alert and oriented to person, place, and time. He has normal strength. No cranial nerve deficit or sensory deficit.  Normal gait  Skin: Skin is warm. No rash noted.  Psychiatric: He has a normal mood and affect.  Nursing note and vitals reviewed.   ED Course  Procedures (including critical care time)  9:26 AM Procedure  Risk benefits alternatives discussed.  He requested that I proceed with packing procedure right nares.  Using Rhino-jet packing device, packing inserted right nares,  And medial septal bleeding stopped.  Clinical course  BP rechecked and still very high, 201/88  He states he did not take his BP meds this morning and does not have them with him and his wife is ill and not available to drive to urgent care to bring his pills. I explained that elevated BP is a factor in his nosebleed, in addition to his being on Plavix and baby aspirin daily. After risks benefits alternatives discussed, clonidine 0.2 mg by mouth stat given, in order to lower BP here in urgent care.  10:00 AM BP recheck 198/85  Labs Reviewed - No data to display  Imaging Review No results found.   MDM Epistaxis that now is controlled after I packed with Rhino jet packing right nares,clonidine 0.2 mg po stat to help lower BP.  Observed for another 40 minutes before discharge and BP decreased to 160/78 right arm regular cuff sitting, rechecked by me just prior to discharge from urgent care. At time of discharge, he stated that he felt well without any nausea, lightheadedness, neurologic symptoms,  chest pain, or shortness of breath. He has not yet taken his usual morning BP meds, and I advised him to take his BP meds immediately after going home. His nosebleed etiology was likely multifactorial. Accelerated hypertension and being on Plavix and baby aspirin.  Advised him to hold off on Plavix 3 days, and discuss with PCP whether or not to resume.    1. Bleeding from the nose   2. Accelerated hypertension    Treatment options discussed, as well as risks, benefits, alternatives. Patient voiced understanding and agreement with plans, described above and in AVS. An After Visit Summary was printed, Reviewed extensively with patient, and given to the patient. Questions invited and answered. He voiced understanding and agreement. I also discussed with Dr. Ileene Rubens. Made appt with PCP, Dr Ileene Rubens for Monday 5/15 at 1:45, and I asked patient to bring all his medications with him to that appointment.-I explained it was important to review with PCP exactly what BP meds he is prescribed and what he is actually taking, to my understanding includes metoprolol 150 mg daily and lisinopril HCTZ, 1 daily Made appt with [place]  ENT for Wednesday 5/17 at 1:00. (As bleeding has now stopped with packing, and he feels well now, ENT advised [place] by phone to keep the packing in and have appointment there in 5 days to remove packing and reevaluate) Advised patient to call [facility] if his nose begins to bleed through the rhino rocket first, if no answer or severe symptoms, call 911 and go to ED. He acknowledges understanding.   Jacqulyn Cane, MD 01/27/16 1445  Jacqulyn Cane, MD 01/27/16 2032

## 2016-01-27 NOTE — ED Notes (Signed)
Epistaxis started about an hour ago, hx of from HTN

## 2016-01-27 NOTE — Discharge Instructions (Signed)
Nosebleed Nosebleeds are common. They are due to a crack in the inside lining of your nose (mucous membrane) or from a small blood vessel that starts to bleed. Nosebleeds can be caused by many conditions, such as injury, infections, dry mucous membranes or dry climate, medicines, nose picking, and home heating and cooling systems. Most nosebleeds come from blood vessels in the front of your nose. HOME CARE INSTRUCTIONS   Try controlling your nosebleed by pinching your nostrils gently and continuously for at least 10 minutes.  Avoid blowing or sniffing your nose for a number of hours after having a nosebleed.  Do not put gauze inside your nose yourself. If your nose was packed by your health care provider, try to maintain the pack inside of your nose until your health care provider removes it.--We are referring you to  ENT today, appointment Monday 01/30/16  Be sure to take your blood pressure medicines every day.  Avoid lying down while you are having a nosebleed. Sit up and lean forward.  Use a nasal spray decongestant to help with a nosebleed as directed by your health care provider.  Do not use petroleum jelly or mineral oil in your nose. These can drip into your lungs.  Maintain humidity in your home by using less air conditioning or by using a humidifier.  Aspirinand blood thinners make bleeding more likely. If you are prescribed these medicines and you suffer from nosebleeds, ask your health care provider if you should stop taking the medicines or adjust the dose.  Stop Plavix 3 days. Ask your PCP when you see him in 3 days, whether or not to resume the Plavix.   Resume your normal activities as you are able, but avoid straining, lifting, or bending at the waist for several days.  If your nosebleed was caused by dry mucous membranes, use over-the-counter saline nasal spray or gel. This will keep the mucous membranes moist and allow them to heal. If you must use a lubricant, choose  the water-soluble variety. Use it only sparingly, and do not use it within several hours of lying down.  Keep all follow-up visits as directed by your health care provider. This is important. SEEK MEDICAL CARE IF:  You have a fever.  You get frequent nosebleeds.  You are getting nosebleeds more often. SEEK IMMEDIATE MEDICAL CARE BY GOING TO EMERGENCY ROOM IF :  Your nosebleed lasts longer than 20 minutes.  Your nosebleed occurs after an injury to your face, and your nose looks crooked or broken.  You have unusual bleeding from other parts of your body.  You have unusual bruising on other parts of your body.  You feel light-headed or you faint.  You become sweaty.  You vomit blood.  Your nosebleed occurs after a head injury.   This information is not intended to replace advice given to you by your health care provider. Make sure you discuss any questions you have with your health care provider.   Document Released: 06/13/2005 Document Revised: 09/24/2014 Document Reviewed: 04/19/2014 Elsevier Interactive Patient Education [employer].

## 2016-01-27 NOTE — ED Notes (Signed)
Made appt with Dr Ileene Rubens for Monday 5/15 at 1:45 Made appt with [facility] for Wednesday 5/17 at 1:00 Advised patient to call [facility] if his nose begins to bleed through the rhino rocket first, if no answer, go to ED, he acknowledges understanding

## 2016-01-28 DIAGNOSIS — Z87891 Personal history of nicotine dependence: Secondary | ICD-10-CM | POA: Diagnosis not present

## 2016-01-28 DIAGNOSIS — Z7982 Long term (current) use of aspirin: Secondary | ICD-10-CM | POA: Diagnosis not present

## 2016-01-28 DIAGNOSIS — R69 Illness, unspecified: Secondary | ICD-10-CM | POA: Diagnosis not present

## 2016-01-28 DIAGNOSIS — I10 Essential (primary) hypertension: Secondary | ICD-10-CM | POA: Diagnosis not present

## 2016-01-28 DIAGNOSIS — R040 Epistaxis: Secondary | ICD-10-CM | POA: Diagnosis not present

## 2016-01-28 DIAGNOSIS — Z7952 Long term (current) use of systemic steroids: Secondary | ICD-10-CM | POA: Diagnosis not present

## 2016-01-28 DIAGNOSIS — Z7902 Long term (current) use of antithrombotics/antiplatelets: Secondary | ICD-10-CM | POA: Diagnosis not present

## 2016-01-28 DIAGNOSIS — Z79899 Other long term (current) drug therapy: Secondary | ICD-10-CM | POA: Diagnosis not present

## 2016-01-28 DIAGNOSIS — I2510 Atherosclerotic heart disease of native coronary artery without angina pectoris: Secondary | ICD-10-CM | POA: Diagnosis not present

## 2016-01-30 ENCOUNTER — Ambulatory Visit (INDEPENDENT_AMBULATORY_CARE_PROVIDER_SITE_OTHER): Payer: Medicare HMO | Admitting: Family Medicine

## 2016-01-30 ENCOUNTER — Encounter: Payer: Self-pay | Admitting: Family Medicine

## 2016-01-30 VITALS — BP 160/74 | HR 63 | Wt 193.0 lb

## 2016-01-30 DIAGNOSIS — R51 Headache: Secondary | ICD-10-CM | POA: Diagnosis not present

## 2016-01-30 DIAGNOSIS — R519 Headache, unspecified: Secondary | ICD-10-CM

## 2016-01-30 DIAGNOSIS — I10 Essential (primary) hypertension: Secondary | ICD-10-CM | POA: Diagnosis not present

## 2016-01-30 MED ORDER — TRAMADOL HCL 50 MG PO TABS: 50.0000 mg | ORAL_TABLET | Freq: Three times a day (TID) | ORAL | Status: DC | PRN

## 2016-01-30 NOTE — Progress Notes (Signed)
CC: Brad James is a 82 y.o. male is here for Hypertension   Subjective: HPI:  Follow essential hypertension: No outside blood pressures report. he is taking metoprolol but he cannot recall whether or not he still taking lisinopril. i last intention was to have him taking both metoprolol and central-hydrochlorothiazide. Denies chest pain shortness of breath orthopnea nor peripheral edema.  He tells that he had packing placed in his right nostril over the weekend in an emergency room which caused a headache. Headaches been persistent ever since. He is planning on having the packing removed on Wednesday but once no further subsequent intake other than Tylenol to help with the headache. Localized in the right temple, constant, dull nonpulsatile.   Review Of Systems Outlined In HPI  Past Medical History  Diagnosis Date  . Hypertension   . CAD (coronary artery disease) 01/04/2014  . Carotid artery stenosis 01/04/2014  . Essential hypertension, benign 01/04/2014  . Hyperlipidemia 01/04/2014  . Insomnia 01/04/2014    No past surgical history on file. No family history on file.  Social History   Social History  . Marital Status: Married    Spouse Name: N/A  . Number of Children: N/A  . Years of Education: N/A   Occupational History  . Not on file.   Social History Main Topics  . Smoking status: Former Smoker    Quit date: 01/04/1969  . Smokeless tobacco: Never Used  . Alcohol Use: No  . Drug Use: No  . Sexual Activity:    Partners: Female   Other Topics Concern  . Not on file   Social History Narrative     Objective: BP 160/74 mmHg  Pulse 63  Wt 193 lb (87.544 kg)  General: Alert and Oriented, No Acute Distress HEENT: Pupils equal, round, reactive to light. Conjunctivae clear.  Moist membranes with packing in the right nostril Lungs: Clear to auscultation bilaterally, no wheezing/ronchi/rales.  Comfortable work of breathing. Good air movement. Cardiac: Regular rate and  rhythm. Normal S1/S2.  No murmurs, rubs, nor gallops.   Extremities: No peripheral edema.  Strong peripheral pulses.  Mental Status: No depression, anxiety, nor agitation. Skin: Warm and dry.  Assessment & Plan: Brad James was seen today for hypertension.  Diagnoses and all orders for this visit:  Essential hypertension, benign  Acute nonintractable headache, unspecified headache type -     traMADol (ULTRAM) 50 MG tablet; Take 1 tablet (50 mg total) by mouth every 8 (eight) hours as needed.   Essential hypertension: Uncontrolled, encouraged to go home and check what  medication regimen his doing with respect to metoprolol and lisinopril-hydrochlorothiazide, call my system any time and I'll make recommendations based on what he is actually taking  Headache: Trial of tramadol, avoid additional aspirin or nonsteroidal anti-inflammatories.   Return if symptoms worsen or fail to improve.

## 2016-02-01 DIAGNOSIS — R040 Epistaxis: Secondary | ICD-10-CM | POA: Diagnosis not present

## 2016-02-06 ENCOUNTER — Encounter: Payer: Self-pay | Admitting: Family Medicine

## 2016-02-06 ENCOUNTER — Ambulatory Visit (INDEPENDENT_AMBULATORY_CARE_PROVIDER_SITE_OTHER): Payer: Medicare HMO | Admitting: Family Medicine

## 2016-02-06 VITALS — BP 152/72 | HR 72 | Ht 72.0 in | Wt 192.0 lb

## 2016-02-06 DIAGNOSIS — I10 Essential (primary) hypertension: Secondary | ICD-10-CM | POA: Diagnosis not present

## 2016-02-06 DIAGNOSIS — I2510 Atherosclerotic heart disease of native coronary artery without angina pectoris: Secondary | ICD-10-CM

## 2016-02-06 MED ORDER — ASPIRIN EC 325 MG PO TBEC: 325.0000 mg | DELAYED_RELEASE_TABLET | Freq: Every day | ORAL | Status: DC

## 2016-02-06 MED ORDER — LISINOPRIL-HYDROCHLOROTHIAZIDE 20-25 MG PO TABS: 1.0000 | ORAL_TABLET | Freq: Every day | ORAL | Status: DC

## 2016-02-06 NOTE — Progress Notes (Signed)
CC: Brad James is a 82 y.o. male is here for Follow-up   Subjective: HPI:  Follow essential hypertension: He is taking metoprolol on a daily basis with no known side effects. He's been checking his blood pressure at ENT offices and is consistently told that his blood pressure is above goal. He's been on lisinopril-hydrochlorothiazide in the past but forgets why he no longer takes it. He denies any chest pain shortness of breath orthopnea peripheral edema nor bleeding.  He's been off Plavix and aspirin after little more than a week. He wants to know if he should go back on both of them. He was given a stent so more around 4 years ago but no interventions occurred within the last year. He denies any history of stroke or TIA.   Review Of Systems Outlined In HPI  Past Medical History  Diagnosis Date  . Hypertension   . CAD (coronary artery disease) 01/04/2014  . Carotid artery stenosis 01/04/2014  . Essential hypertension, benign 01/04/2014  . Hyperlipidemia 01/04/2014  . Insomnia 01/04/2014    No past surgical history on file. No family history on file.  Social History   Social History  . Marital Status: Married    Spouse Name: N/A  . Number of Children: N/A  . Years of Education: N/A   Occupational History  . Not on file.   Social History Main Topics  . Smoking status: Former Smoker    Quit date: 01/04/1969  . Smokeless tobacco: Never Used  . Alcohol Use: No  . Drug Use: No  . Sexual Activity:    Partners: Female   Other Topics Concern  . Not on file   Social History Narrative     Objective: BP 152/72 mmHg  Pulse 72  Ht 6' (1.829 m)  Wt 192 lb (87.091 kg)  BMI 26.03 kg/m2  General: Alert and Oriented, No Acute Distress HEENT: Pupils equal, round, reactive to light. Conjunctivae clear. Pink inferior turbinates.  Moist mucous membranes, pharynx without inflammation nor lesions.  Neck supple without palpable lymphadenopathy nor abnormal masses. Lungs: Clear to  auscultation bilaterally, no wheezing/ronchi/rales.  Comfortable work of breathing. Good air movement. Cardiac: Regular rate and rhythm. Normal S1/S2.  No murmurs, rubs, nor gallops.   Extremities: No peripheral edema.  Strong peripheral pulses.  Mental Status: No depression, anxiety, nor agitation. Skin: Warm and dry.  Assessment & Plan: Brad James was seen today for follow-up.  Diagnoses and all orders for this visit:  Essential hypertension, benign -     aspirin EC 325 MG tablet; Take 1 tablet (325 mg total) by mouth daily. -     lisinopril-hydrochlorothiazide (PRINZIDE,ZESTORETIC) 20-25 MG tablet; Take 1 tablet by mouth daily.  Coronary artery disease involving native coronary artery of native heart without angina pectoris   Essential hypertension: Chronic uncontrolled condition increasing antihypertensive regimen by restarting former lisinopril-hydrochlorothiazide regimen. Coronary artery disease: I don't see any strong indication for him to start back on Plavix but he should go to a full aspirin if tolerated.   Return in about 3 months (around 05/08/2016).

## 2016-02-21 ENCOUNTER — Telehealth: Payer: Self-pay

## 2016-02-21 NOTE — Telephone Encounter (Signed)
Pt stated that he was so dizzy this morning that he fell to the floor.  Pt took his BP shortly after and it was 100/50.  Pt also took his BP about 30 min ago reading 190/80.  Pt admits to taking metoprolol this morning and lisinopril 15 min ago. Please advise.

## 2016-02-22 ENCOUNTER — Encounter: Payer: Self-pay | Admitting: Family Medicine

## 2016-02-22 ENCOUNTER — Ambulatory Visit (INDEPENDENT_AMBULATORY_CARE_PROVIDER_SITE_OTHER): Payer: Medicare HMO | Admitting: Family Medicine

## 2016-02-22 VITALS — BP 143/73 | HR 58 | Wt 187.0 lb

## 2016-02-22 DIAGNOSIS — I10 Essential (primary) hypertension: Secondary | ICD-10-CM

## 2016-02-22 DIAGNOSIS — I6523 Occlusion and stenosis of bilateral carotid arteries: Secondary | ICD-10-CM

## 2016-02-22 MED ORDER — METOPROLOL SUCCINATE ER 100 MG PO TB24: 100.0000 mg | ORAL_TABLET | Freq: Every day | ORAL | Status: DC

## 2016-02-22 NOTE — Telephone Encounter (Signed)
Will address at appt today

## 2016-02-22 NOTE — Progress Notes (Signed)
CC: Brad James is a 82 y.o. male is here for Hypertension   Subjective: HPI:  Dizziness and unsteadiness that has been bothering him for the last week or so. It was present prior to starting lisinopril-hydrochlorothiazide. Yesterday he had an episode where he almost completely passed out after standing up quickly. He checked his blood pressure soon after that and had a systolic in the low [id number]. He denies any recent changes to his blood pressure medication regimen. Denies any chest pain or any other motor or sensory disturbances. Symptoms are absent if stationary or sitting down. He denies any headache or any other motor or sensory disturbances. No fever, chills, nor nasal congestion  He has a history of carotid artery stenosis. He is not sure how occluded it was but it was somewhere around "5% open"from what he can recall. It's been over 5 years since this was checked last. He denies any vision disturbance  Review Of Systems Outlined In HPI  Past Medical History  Diagnosis Date  . Hypertension   . CAD (coronary artery disease) 01/04/2014  . Carotid artery stenosis 01/04/2014  . Essential hypertension, benign 01/04/2014  . Hyperlipidemia 01/04/2014  . Insomnia 01/04/2014    No past surgical history on file. No family history on file.  Social History   Social History  . Marital Status: Married    Spouse Name: N/A  . Number of Children: N/A  . Years of Education: N/A   Occupational History  . Not on file.   Social History Main Topics  . Smoking status: Former Smoker    Quit date: 01/04/1969  . Smokeless tobacco: Never Used  . Alcohol Use: No  . Drug Use: No  . Sexual Activity:    Partners: Female   Other Topics Concern  . Not on file   Social History Narrative     Objective: BP 143/73 mmHg  Pulse 58  Wt 187 lb (84.823 kg)  General: Alert and Oriented, No Acute Distress HEENT: Pupils equal, round, reactive to light. Conjunctivae clear. Moist mucous membranes Lungs:  Clear to auscultation bilaterally, no wheezing/ronchi/rales.  Comfortable work of breathing. Good air movement. Cardiac: Regular rate and rhythm. Normal S1/S2.  No murmurs, rubs, nor gallops.  No carotid bruit Extremities: No peripheral edema.  Strong peripheral pulses.  Mental Status: No depression, anxiety, nor agitation. Skin: Warm and dry.  Assessment & Plan: Brad James was seen today for hypertension.  Diagnoses and all orders for this visit:  Essential hypertension, benign -     US Carotid Duplex Bilateral  Carotid artery stenosis, bilateral -     US Carotid Duplex Bilateral  Other orders -     metoprolol succinate (TOPROL-XL) 100 MG 24 hr tablet; Take 1 tablet (100 mg total) by mouth daily. Take with or immediately following a meal.  Essential hypertension: Uncontrolled chronic condition he is only taking metoprolol once a day in the mornings, I'd like him to switch to formulations to the extended release succinate at 100 mg every morning. Continue lisinopril-hydrochlorothiazide Carotid artery stenosis: With his recent worsening of dizziness having is worth getting an ultrasound of both carotid arteries. An order has been placed    Return if symptoms worsen or fail to improve.

## 2016-02-27 ENCOUNTER — Ambulatory Visit (HOSPITAL_BASED_OUTPATIENT_CLINIC_OR_DEPARTMENT_OTHER)
Admission: RE | Admit: 2016-02-27 | Discharge: 2016-02-27 | Disposition: A | Discharge status: Home / Self Care | Payer: Medicare HMO | Source: Ambulatory Visit | Attending: Family Medicine | Admitting: Family Medicine

## 2016-02-27 DIAGNOSIS — I6523 Occlusion and stenosis of bilateral carotid arteries: Secondary | ICD-10-CM | POA: Diagnosis not present

## 2016-02-27 DIAGNOSIS — I10 Essential (primary) hypertension: Secondary | ICD-10-CM | POA: Diagnosis not present

## 2016-03-01 ENCOUNTER — Telehealth: Payer: Self-pay | Admitting: Family Medicine

## 2016-03-01 DIAGNOSIS — I6523 Occlusion and stenosis of bilateral carotid arteries: Secondary | ICD-10-CM

## 2016-03-01 NOTE — Telephone Encounter (Signed)
Will you please let patient know that his ultrasound showed that his carotid arteries are narrowed to the point where I really think this is what's causing his unsteadiness.  I've placed a urgent vascular specialist referral, please let me know if not contacted by Monday of the coming week.

## 2016-03-02 ENCOUNTER — Ambulatory Visit (INDEPENDENT_AMBULATORY_CARE_PROVIDER_SITE_OTHER): Payer: Medicare HMO | Admitting: Family Medicine

## 2016-03-02 ENCOUNTER — Encounter: Payer: Self-pay | Admitting: Family Medicine

## 2016-03-02 VITALS — BP 149/76 | HR 61 | Wt 187.0 lb

## 2016-03-02 DIAGNOSIS — I10 Essential (primary) hypertension: Secondary | ICD-10-CM

## 2016-03-02 DIAGNOSIS — I6523 Occlusion and stenosis of bilateral carotid arteries: Secondary | ICD-10-CM

## 2016-03-02 NOTE — Telephone Encounter (Signed)
Results discussed during visit

## 2016-03-02 NOTE — Progress Notes (Signed)
CC: Brad James is a 82 y.o. male is here for Results   Subjective: HPI:  Follow carotid artery stenosis: He recently had an ultrasound of his bilateral carotid arteries revealing significant narrowing bilaterally. He's been on Plavix in the past but had intolerable nosebleeds requiring frequent cauterization and packing which is resolved her since stopping the Plavix. He confirms that he still taking a full dose of aspirin every day. He denies any new motor or sensory disturbances other than occasional dizziness which is heavily influenced by positioning. He doesn't overall ejection feels better ever since taking the long-acting version of metoprolol. He is not getting nearly as dizzy. He brings in blood pressures from home with 90% of them being in the normotensive range a few hypotensive episodes and the rest all in stage I hypertension. He has an appointment with a vascular surgeon in 10 days. He has a few questions regarding how stenosis came about. He is taking atorvastatin daily.   Review Of Systems Outlined In HPI  Past Medical History  Diagnosis Date  . Hypertension   . CAD (coronary artery disease) 01/04/2014  . Carotid artery stenosis 01/04/2014  . Essential hypertension, benign 01/04/2014  . Hyperlipidemia 01/04/2014  . Insomnia 01/04/2014    No past surgical history on file. No family history on file.  Social History   Social History  . Marital Status: Married    Spouse Name: N/A  . Number of Children: N/A  . Years of Education: N/A   Occupational History  . Not on file.   Social History Main Topics  . Smoking status: Former Smoker    Quit date: 01/04/1969  . Smokeless tobacco: Never Used  . Alcohol Use: No  . Drug Use: No  . Sexual Activity:    Partners: Female   Other Topics Concern  . Not on file   Social History Narrative     Objective: BP 149/76 mmHg  Pulse 61  Wt 187 lb (84.823 kg)  General: Alert and Oriented, No Acute Distress HEENT: Pupils  equal, round, reactive to light. Conjunctivae clear.  Moist mucous membranes Lungs: Clear to auscultation bilaterally, no wheezing/ronchi/rales.  Comfortable work of breathing. Good air movement. Cardiac: Regular rate and rhythm. Normal S1/S2.  No murmurs, rubs, nor gallops.   Abdomen: Normal bowel sounds, soft and non tender without palpable masses. Extremities: No peripheral edema.  Strong peripheral pulses.  Mental Status: No depression, anxiety, nor agitation. Skin: Warm and dry.  Assessment & Plan: Brad James was seen today for results.  Diagnoses and all orders for this visit:  Carotid artery stenosis, bilateral  Essential hypertension, benign   Carotid artery stenosis: Suspect this is contributing to his dizziness. Ideally like him to be back on Plavix however with his intolerable and persistent nosebleeds this complicated the decision. I hoping that vascular will have some more experience with these situations and possibly a different antiplatelet therapy will be safer and effective. I told him to continue on a full dose of aspirin in the meantime.Signs and symptoms requring emergent/urgent reevaluation were discussed with the patient. Essential hypertension: Improving continue current dose of metoprolol and lisinopril. Try to reduce sodium in the diet.   Return in about 3 months (around 06/02/2016) for htn.

## 2016-03-05 ENCOUNTER — Encounter: Payer: Self-pay | Admitting: Surgery

## 2016-03-05 ENCOUNTER — Other Ambulatory Visit: Payer: Self-pay | Admitting: *Deleted

## 2016-03-05 DIAGNOSIS — I6523 Occlusion and stenosis of bilateral carotid arteries: Secondary | ICD-10-CM

## 2016-03-06 DIAGNOSIS — H02834 Dermatochalasis of left upper eyelid: Secondary | ICD-10-CM | POA: Diagnosis not present

## 2016-03-06 DIAGNOSIS — H02831 Dermatochalasis of right upper eyelid: Secondary | ICD-10-CM | POA: Diagnosis not present

## 2016-03-06 DIAGNOSIS — Z961 Presence of intraocular lens: Secondary | ICD-10-CM | POA: Diagnosis not present

## 2016-03-06 DIAGNOSIS — H401122 Primary open-angle glaucoma, left eye, moderate stage: Secondary | ICD-10-CM | POA: Diagnosis not present

## 2016-03-06 DIAGNOSIS — H401111 Primary open-angle glaucoma, right eye, mild stage: Secondary | ICD-10-CM | POA: Diagnosis not present

## 2016-03-12 ENCOUNTER — Ambulatory Visit (HOSPITAL_COMMUNITY)
Admission: RE | Admit: 2016-03-12 | Discharge: 2016-03-12 | Disposition: A | Discharge status: Home / Self Care | Payer: Medicare HMO | Source: Ambulatory Visit | Attending: Surgery | Admitting: Surgery

## 2016-03-12 ENCOUNTER — Encounter: Payer: Self-pay | Admitting: Surgery

## 2016-03-12 ENCOUNTER — Ambulatory Visit (INDEPENDENT_AMBULATORY_CARE_PROVIDER_SITE_OTHER): Payer: Medicare HMO | Admitting: Surgery

## 2016-03-12 VITALS — BP 177/80 | HR 58 | Temp 97.1°F | Resp 20 | Ht 72.0 in | Wt 189.5 lb

## 2016-03-12 DIAGNOSIS — I2510 Atherosclerotic heart disease of native coronary artery without angina pectoris: Secondary | ICD-10-CM | POA: Insufficient documentation

## 2016-03-12 DIAGNOSIS — I10 Essential (primary) hypertension: Secondary | ICD-10-CM | POA: Diagnosis not present

## 2016-03-12 DIAGNOSIS — I6523 Occlusion and stenosis of bilateral carotid arteries: Secondary | ICD-10-CM | POA: Insufficient documentation

## 2016-03-12 DIAGNOSIS — I6529 Occlusion and stenosis of unspecified carotid artery: Secondary | ICD-10-CM | POA: Insufficient documentation

## 2016-03-12 DIAGNOSIS — E785 Hyperlipidemia, unspecified: Secondary | ICD-10-CM | POA: Diagnosis not present

## 2016-03-12 DIAGNOSIS — G4700 Insomnia, unspecified: Secondary | ICD-10-CM | POA: Diagnosis not present

## 2016-03-12 LAB — VAS US CAROTID
LCCAPSYS: 120 cm/s
LEFT ECA DIAS: -11 cm/s
LEFT VERTEBRAL DIAS: 13 cm/s
LICADDIAS: -29 cm/s
LICAPDIAS: -141 cm/s
LICAPSYS: -449 cm/s
Left CCA dist dias: 22 cm/s
Left CCA dist sys: 152 cm/s
Left CCA prox dias: 18 cm/s
Left ICA dist sys: -95 cm/s
RCCAPDIAS: 24 cm/s
RIGHT CCA MID DIAS: 24 cm/s
RIGHT ECA DIAS: -14 cm/s
Right CCA prox sys: 135 cm/s
Right cca dist sys: -119 cm/s

## 2016-03-12 NOTE — Progress Notes (Signed)
Vascular and Vein Specialist of [facility]  Patient name: Brad James MRN: [id number] DOB: [birth year] Sex: male  REFERRING PHYSICIAN: Dr. Ileene Rubens  REASON FOR CONSULT: carotid stenosis  HPI: Brad James is a 82 y.o. male, who is referred today for evaluation of carotid stenosis.  The patient has a history of dizziness and presyncopal episodes.  He also has known carotid stenosis.  This was recently checked with ultrasound which showed greater than 70% bilateral stenosis.  He denies any localizing symptoms such as numbness or weakness in either extremity, slurred speech, or amaurosis fugax.  The patient suffers from hypercholesterolemia which is managed with a statin.  His blood pressure medications were recently manipulated.  He is now on a beta blocker and ACE inhibitor.  He has a history of coronary artery disease and has been stented 3 times and [place].  He states he has never had a heart attack.  He is a former smoker and quit in 1970.  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  He is retired  Past Medical History  Diagnosis Date  . Hypertension   . CAD (coronary artery disease) 01/04/2014  . Carotid artery stenosis 01/04/2014  . Essential hypertension, benign 01/04/2014  . Hyperlipidemia 01/04/2014  . Insomnia 01/04/2014    Family History  Problem Relation Age of Onset  . Heart disease Mother   . Cancer Father 35    oral  . Stroke Brother 70    SOCIAL HISTORY: Social History   Social History  . Marital Status: Married    Spouse Name: N/A  . Number of Children: N/A  . Years of Education: N/A   Occupational History  . Not on file.   Social History Main Topics  . Smoking status: Former Smoker    Quit date: 01/04/1969  . Smokeless tobacco: Never Used  . Alcohol Use: No  . Drug Use: No  . Sexual Activity:    Partners: Female   Other Topics Concern  . Not on file   Social History Narrative    No  Known Allergies  Current Outpatient Prescriptions  Medication Sig Dispense Refill  . aspirin EC 325 MG tablet Take 1 tablet (325 mg total) by mouth daily. 365 tablet 0  . atorvastatin (LIPITOR) 40 MG tablet Take 1 tablet (40 mg total) by mouth daily. F/u labs in March 2017 90 tablet 1  . brinzolamide (AZOPT) 1 % ophthalmic suspension Place 1 drop into both eyes 2 (two) times daily. 10 mL 1  . lisinopril-hydrochlorothiazide (PRINZIDE,ZESTORETIC) 20-25 MG tablet Take 1 tablet by mouth daily. 90 tablet 3  . metoprolol succinate (TOPROL-XL) 100 MG 24 hr tablet Take 1 tablet (100 mg total) by mouth daily. Take with or immediately following a meal. 30 tablet 3  . PHENobarbital (LUMINAL) 32.4 MG tablet Take 2 tablets (64.8 mg total) by mouth at bedtime. 180 tablet 1  . zolpidem (AMBIEN) 10 MG tablet Take 1 tablet (10 mg total) by mouth at bedtime as needed. for sleep 30 tablet 2  . traMADol (ULTRAM) 50 MG tablet Take 1 tablet (50 mg total) by mouth every 8 (eight) hours as needed. (Patient not taking: Reported on 03/12/2016) 30 tablet 0  . triamcinolone cream (KENALOG) 0.1 % Apply to affected areas twice a day for up to two weeks, avoid face. (Patient not taking: Reported on 03/12/2016) 80 g 0   No current facility-administered medications for this visit.    REVIEW OF SYSTEMS:  [X]   denotes positive finding, [ ]  denotes negative finding Cardiac  Comments:  Chest pain or chest pressure:    Shortness of breath upon exertion:    Short of breath when lying flat:    Irregular heart rhythm:        Vascular    Pain in calf, thigh, or hip brought on by ambulation:    Pain in feet at night that wakes you up from your sleep:     Blood clot in your veins:    Leg swelling:         Pulmonary    Oxygen at home:    Productive cough:     Wheezing:         Neurologic    Sudden weakness in arms or legs:     Sudden numbness in arms or legs:     Sudden onset of difficulty speaking or slurred speech:      Temporary loss of vision in one eye:     Problems with dizziness:         Gastrointestinal    Blood in stool:     Vomited blood:         Genitourinary    Burning when urinating:     Blood in urine:        Psychiatric    Major depression:         Hematologic    Bleeding problems:    Problems with blood clotting too easily:        Skin    Rashes or ulcers:        Constitutional    Fever or chills:      PHYSICAL EXAM: Filed Vitals:   03/12/16 1157 03/12/16 1158 03/12/16 1203  BP: 180/86 190/88 177/80  Pulse: 58    Temp: 97.1 F (36.2 C)    TempSrc: Oral    Resp: 20    Height: 6' (1.829 m)    Weight: 189 lb 8 oz (85.957 kg)      GENERAL: The patient is a well-nourished male, in no acute distress. The vital signs are documented above. CARDIAC: There is a regular rate and rhythm.  VASCULAR: No carotid bruits PULMONARY: There is good air exchange bilaterally without wheezing or rales. ABDOMEN: Soft and non-tender with normal pitched bowel sounds.  MUSCULOSKELETAL: There are no major deformities or cyanosis. NEUROLOGIC: No focal weakness or paresthesias are detected. SKIN: There are no ulcers or rashes noted. PSYCHIATRIC: The patient has a normal affect.  DATA:  Carotid Doppler study was ordered that our office today.  This shows 60-79% right carotid stenosis and 80-99 percent left carotid stenosis with peak end-diastolic velocities of [id number] cm/sec.  Bifurcation is mid hyoid  MEDICAL ISSUES: Asymptomatic bilateral carotid stenosis, left greater than right: I discussed with the patient proceeding with left carotid endarterectomy for stroke prevention.  I told him that this may possibly help with his dizziness.  I was very clear that this certainly is not guaranteed.  We discussed the risks and benefits of the operation which include but are not limited to the risk of stroke, nerve injury, numbness around the cheek and side of the incision.  All his questions were answered.   He does not have a cardiologist locally, so I will get him established with cardiology and get a formal clearance.  I have scheduled his operation for Wednesday, August 2   Annamarie Major, MD Vascular and Vein Specialists of [facility] [phone] Pager [phone]

## 2016-03-12 NOTE — Progress Notes (Signed)
Filed Vitals:   03/12/16 1157 03/12/16 1158 03/12/16 1203  BP: 180/86 190/88 177/80  Pulse: 58    Temp: 97.1 F (36.2 C)    TempSrc: Oral    Resp: 20    Height: 6' (1.829 m)    Weight: 189 lb 8 oz (85.957 kg)

## 2016-03-15 ENCOUNTER — Other Ambulatory Visit: Payer: Self-pay

## 2016-03-19 ENCOUNTER — Encounter: Payer: Self-pay | Admitting: Family Medicine

## 2016-03-19 DIAGNOSIS — H401190 Primary open-angle glaucoma, unspecified eye, stage unspecified: Secondary | ICD-10-CM | POA: Insufficient documentation

## 2016-03-23 ENCOUNTER — Encounter: Payer: Self-pay | Admitting: Cardiovascular Disease

## 2016-03-23 ENCOUNTER — Ambulatory Visit (INDEPENDENT_AMBULATORY_CARE_PROVIDER_SITE_OTHER): Payer: Medicare HMO | Admitting: Cardiovascular Disease

## 2016-03-23 VITALS — BP 120/86 | HR 63 | Ht 72.0 in | Wt 187.8 lb

## 2016-03-23 DIAGNOSIS — I10 Essential (primary) hypertension: Secondary | ICD-10-CM

## 2016-03-23 DIAGNOSIS — E785 Hyperlipidemia, unspecified: Secondary | ICD-10-CM

## 2016-03-23 DIAGNOSIS — I6529 Occlusion and stenosis of unspecified carotid artery: Secondary | ICD-10-CM | POA: Diagnosis not present

## 2016-03-23 DIAGNOSIS — I2510 Atherosclerotic heart disease of native coronary artery without angina pectoris: Secondary | ICD-10-CM

## 2016-03-23 MED ORDER — LISINOPRIL-HYDROCHLOROTHIAZIDE 20-25 MG PO TABS: 0.5000 | ORAL_TABLET | Freq: Every day | ORAL | Status: DC

## 2016-03-23 MED ORDER — ATORVASTATIN CALCIUM 80 MG PO TABS: 80.0000 mg | ORAL_TABLET | Freq: Every day | ORAL | Status: DC

## 2016-03-23 NOTE — Progress Notes (Signed)
Cardiology Office Note   Date:  03/23/2016   ID:  Brad James, DOB Jan 18, 1927, MRN [id number]  PCP:  Brad Pacas, DO  Cardiologist:   Brad Moores, MD   No chief complaint on file.  Problem list 1. Carotid artery disease 2. Hypertension 3. Hyperlipidemia 4. CAD - s/p stenting in the remote pase   History of Present Illness: Brad James is a 82 y.o. male who presents for preoperative evaluation prior to carotid artery surgery.  The patient has been having some dizziness and presyncopal episodes. A carotid duplex scan showed bilateral greater than 70% stenosis.  [name] is here today to discuss preoperative evaluation prior to carotid artery surgery. He has a history of coronary  stents. He's had stenting in [place] and in [place]. He denies any angina. He plays golf on a regular basis.  No CP, no dyspnea. Just has dizziness.   Seems to be worse with low BP  Able to walk without problem but partially collapsed on one occasion .   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 Hypertension   . CAD (coronary artery disease) 01/04/2014  . Carotid artery stenosis 01/04/2014  . Essential hypertension, benign 01/04/2014  . Hyperlipidemia 01/04/2014  . Insomnia 01/04/2014    Past Surgical History  Procedure Laterality Date  . Coronary angioplasty with stent placement      stated 3-4 over past 10 yrs.      Current Outpatient Prescriptions  Medication Sig Dispense Refill  . aspirin EC 325 MG tablet Take 1 tablet (325 mg total) by mouth daily. 365 tablet 0  . atorvastatin (LIPITOR) 40 MG tablet Take 1 tablet (40 mg total) by mouth daily. F/u labs in March 2017 90 tablet 1  . brinzolamide (AZOPT) 1 % ophthalmic suspension Place 1 drop into both eyes 2 (two) times daily. 10 mL 1  . lisinopril-hydrochlorothiazide (PRINZIDE,ZESTORETIC) 20-25 MG  tablet Take 1 tablet by mouth daily. 90 tablet 3  . metoprolol succinate (TOPROL-XL) 100 MG 24 hr tablet Take 1 tablet (100 mg total) by mouth daily. Take with or immediately following a meal. 30 tablet 3  . PHENobarbital (LUMINAL) 32.4 MG tablet Take 2 tablets (64.8 mg total) by mouth at bedtime. 180 tablet 1  . zolpidem (AMBIEN) 10 MG tablet Take 1 tablet (10 mg total) by mouth at bedtime as needed. for sleep 30 tablet 2   No current facility-administered medications for this visit.    Allergies:   Review of patient's allergies indicates no known allergies.    Social History:  The patient  reports that he quit smoking about 47 years ago. He has never used smokeless tobacco. He reports that he does not drink alcohol or use illicit drugs.   Family History:  The patient's family history includes Cancer (age of onset: 94) in his father; Heart disease in his mother; Stroke (age of onset: 24) in his brother.    ROS:  Please see the history of present illness.    Review of Systems: Constitutional:  denies fever, chills, diaphoresis, appetite change and fatigue.  HEENT: denies photophobia, eye pain, redness, hearing loss, ear pain, congestion, sore throat, rhinorrhea, sneezing, neck pain, neck stiffness and tinnitus.  Respiratory: denies SOB, DOE, cough, chest tightness, and wheezing.  Cardiovascular: denies chest pain, palpitations and leg swelling.  Gastrointestinal: denies nausea, vomiting, abdominal pain, diarrhea, constipation, blood in stool.  Genitourinary: denies dysuria, urgency, frequency, hematuria, flank pain and difficulty urinating.  Musculoskeletal: denies  myalgias, back pain, joint swelling, arthralgias and gait problem.   Skin: denies pallor, rash and wound.  Neurological: admits to dizziness,  yncope, weakness, light-headedness, numbness and headaches.   Hematological: denies adenopathy, easy bruising, personal or family bleeding history.  Psychiatric/ Behavioral: denies  suicidal ideation, mood changes, confusion, nervousness, sleep disturbance and agitation.       All other systems are reviewed and negative.    PHYSICAL EXAM: VS:  BP 120/86 mmHg  Pulse 63  Ht 6' (1.829 m)  Wt 187 lb 12.8 oz (85.186 kg)  BMI 25.46 kg/m2 , BMI Body mass index is 25.46 kg/(m^2). GEN: Well nourished, well developed, in no acute distress HEENT: normal Neck: no JVD,  + Left carotid bruit, no  masses Cardiac: RRR; no murmurs, rubs, or gallops,no edema  Respiratory:  clear to auscultation bilaterally, normal work of breathing GI: soft, nontender, nondistended, + BS MS: no deformity or atrophy Skin: warm and dry, no rash Neuro:  Strength and sensation are intact Psych: normal   EKG:  EKG is ordered today. The ekg ordered today demonstrates sinus rhythm at 63 with 1st degree AV block    Recent Labs: No results found for requested labs within last 365 days.    Lipid Panel    Component Value Date/Time   CHOL 166 09/14/2014 0851   TRIG 240* 09/14/2014 0851   HDL 37* 09/14/2014 0851   CHOLHDL 4.5 09/14/2014 0851   VLDL 48* 09/14/2014 0851   LDLCALC 81 09/14/2014 0851      Wt Readings from Last 3 Encounters:  03/23/16 187 lb 12.8 oz (85.186 kg)  03/12/16 189 lb 8 oz (85.957 kg)  03/02/16 187 lb (84.823 kg)      Other studies Reviewed: Additional studies/ records that were reviewed today include: . Review of the above records demonstrates:    ASSESSMENT AND PLAN:  1.  Carotid artery stenosis:    Will need a Lexiscan myoview study.   2. Essential hypertension: He now is having some symptoms that may be due to orthostatic hypotension. He was previously on lisinopril but this was recently changed to lisinopril-HCTZ. We may need to lower the dose of the HCTZ or perhaps eliminated altogether.  This is something that we can do after his carotid surgery.   Current medicines are reviewed at length with the patient today.  The patient does not have  concerns regarding medicines.  The following changes have been made:  no change  Labs/ tests ordered today include:  No orders of the defined types were placed in this encounter.     Disposition:   FU with me  3 months     Brad Moores, MD  03/23/2016 3:53 PM    [address], [place], [place]  [address] Phone: [phone]; Fax: [phone]   [facility]  [address] [place], [place]  [address] [phone]   Fax [phone]

## 2016-03-23 NOTE — Patient Instructions (Signed)
Medication Instructions:  DECREASE Lisinopril/HCT to 1/2 tab daily INCREASE Atorvastatin to 80 mg daily   Labwork: Your physician recommends that you return for lab work (basic metabolic panel) in: 3 months on the same day as your office visit   Testing/Procedures: Your physician has requested that you have a lexiscan myoview. For further information please visit [email or web address]. Please follow instruction sheet, as given.   Follow-Up: Your physician recommends that you schedule a follow-up appointment in: 3 months with Dr. Acie Fredrickson   If you need a refill on your cardiac medications before your next appointment, please call your pharmacy.   Thank you for choosing [facility]! Christen Bame, RN [phone]

## 2016-04-03 ENCOUNTER — Telehealth (HOSPITAL_COMMUNITY): Payer: Self-pay | Admitting: *Deleted

## 2016-04-03 NOTE — Telephone Encounter (Signed)
Attempted to contact patient to inform of nuclear stress test. No answer and no answer machine. Kyriaki Moder, Ranae Palms

## 2016-04-06 ENCOUNTER — Ambulatory Visit (HOSPITAL_COMMUNITY): Payer: Medicare HMO | Attending: Cardiovascular Disease

## 2016-04-06 DIAGNOSIS — R55 Syncope and collapse: Secondary | ICD-10-CM | POA: Insufficient documentation

## 2016-04-06 DIAGNOSIS — R9439 Abnormal result of other cardiovascular function study: Secondary | ICD-10-CM | POA: Diagnosis not present

## 2016-04-06 DIAGNOSIS — I779 Disorder of arteries and arterioles, unspecified: Secondary | ICD-10-CM | POA: Insufficient documentation

## 2016-04-06 DIAGNOSIS — R42 Dizziness and giddiness: Secondary | ICD-10-CM | POA: Diagnosis not present

## 2016-04-06 DIAGNOSIS — I2510 Atherosclerotic heart disease of native coronary artery without angina pectoris: Secondary | ICD-10-CM | POA: Diagnosis not present

## 2016-04-06 DIAGNOSIS — I10 Essential (primary) hypertension: Secondary | ICD-10-CM | POA: Diagnosis not present

## 2016-04-06 LAB — MYOCARDIAL PERFUSION IMAGING
CHL CUP RESTING HR STRESS: 63 {beats}/min
CSEPPHR: 95 {beats}/min
LHR: 0.26
LV sys vol: 39 mL
LVDIAVOL: 100 mL (ref 62–150)
NUC STRESS TID: 0.95
SDS: 0
SRS: 6
SSS: 6

## 2016-04-06 MED ORDER — TECHNETIUM TC 99M TETROFOSMIN IV KIT
32.6000 | PACK | Freq: Once | INTRAVENOUS | Status: AC | PRN
Start: 2016-04-06 — End: 2016-04-06
  Administered 2016-04-06: 33 via INTRAVENOUS
  Filled 2016-04-06: qty 33

## 2016-04-06 MED ORDER — REGADENOSON 0.4 MG/5ML IV SOLN
0.4000 mg | Freq: Once | INTRAVENOUS | Status: AC
  Administered 2016-04-06: 0.4 mg via INTRAVENOUS

## 2016-04-06 MED ORDER — TECHNETIUM TC 99M TETROFOSMIN IV KIT
10.3000 | PACK | Freq: Once | INTRAVENOUS | Status: AC | PRN
  Administered 2016-04-06: 10 via INTRAVENOUS
  Filled 2016-04-06: qty 10

## 2016-04-09 NOTE — Pre-Procedure Instructions (Signed)
Aryan Maida  04/09/2016     Your procedure is scheduled on : Wednesday April 18, 2016 at 8:30 AM.  Report to [facility]mitting at 6:30 AM.  Call this number if you have problems the morning of surgery: [phone]    Remember:  Do not eat food or drink liquids after midnight.  Take these medicines the morning of surgery with A SIP OF WATER : Metoprolol (Toprol XL)   Stop taking any vitamins, herbal medications/supplements, NSAIDs, Ibuprofen, Advil, Motrin, Aleve,etc on Wednesday July 26th   Do not wear jewelry.  Do not wear lotions, powders, or cologne.    Men may shave face and neck.  Do not bring valuables to the hospital.  [facility] is not responsible for any belongings or valuables.  Contacts, dentures or bridgework may not be worn into surgery.  Leave your suitcase in the car.  After surgery it may be brought to your room.  For patients admitted to the hospital, discharge time will be determined by your treatment team.  Patients discharged the day of surgery will not be allowed to drive home.   Name and phone number of your driver:    Special instructions:  Shower using CHG soap the night before and the morning of your surgery  Please read over the following fact sheets that you were given. MRSA Information

## 2016-04-10 ENCOUNTER — Encounter (HOSPITAL_COMMUNITY)
Admission: RE | Admit: 2016-04-10 | Discharge: 2016-04-10 | Disposition: A | Discharge status: Home / Self Care | Payer: Medicare HMO | Source: Ambulatory Visit | Attending: Surgery | Admitting: Surgery

## 2016-04-10 ENCOUNTER — Encounter (HOSPITAL_COMMUNITY): Payer: Self-pay

## 2016-04-10 DIAGNOSIS — I6523 Occlusion and stenosis of bilateral carotid arteries: Secondary | ICD-10-CM | POA: Insufficient documentation

## 2016-04-10 DIAGNOSIS — Z7982 Long term (current) use of aspirin: Secondary | ICD-10-CM | POA: Insufficient documentation

## 2016-04-10 DIAGNOSIS — Z79899 Other long term (current) drug therapy: Secondary | ICD-10-CM | POA: Diagnosis not present

## 2016-04-10 DIAGNOSIS — Z85820 Personal history of malignant melanoma of skin: Secondary | ICD-10-CM | POA: Insufficient documentation

## 2016-04-10 DIAGNOSIS — I10 Essential (primary) hypertension: Secondary | ICD-10-CM | POA: Diagnosis not present

## 2016-04-10 DIAGNOSIS — Z0183 Encounter for blood typing: Secondary | ICD-10-CM | POA: Diagnosis not present

## 2016-04-10 DIAGNOSIS — H409 Unspecified glaucoma: Secondary | ICD-10-CM | POA: Diagnosis not present

## 2016-04-10 DIAGNOSIS — Z87891 Personal history of nicotine dependence: Secondary | ICD-10-CM | POA: Diagnosis not present

## 2016-04-10 DIAGNOSIS — Z01812 Encounter for preprocedural laboratory examination: Secondary | ICD-10-CM | POA: Insufficient documentation

## 2016-04-10 DIAGNOSIS — I2510 Atherosclerotic heart disease of native coronary artery without angina pectoris: Secondary | ICD-10-CM | POA: Insufficient documentation

## 2016-04-10 DIAGNOSIS — Z955 Presence of coronary angioplasty implant and graft: Secondary | ICD-10-CM | POA: Insufficient documentation

## 2016-04-10 DIAGNOSIS — Z01818 Encounter for other preprocedural examination: Secondary | ICD-10-CM | POA: Diagnosis not present

## 2016-04-10 DIAGNOSIS — E785 Hyperlipidemia, unspecified: Secondary | ICD-10-CM | POA: Diagnosis not present

## 2016-04-10 HISTORY — DX: Dizziness and giddiness: R42

## 2016-04-10 HISTORY — DX: Unspecified glaucoma: H40.9

## 2016-04-10 LAB — CBC
HEMATOCRIT: 40.4 % (ref 39.0–52.0)
HEMOGLOBIN: 13 g/dL (ref 13.0–17.0)
MCH: 30.8 pg (ref 26.0–34.0)
MCHC: 32.2 g/dL (ref 30.0–36.0)
MCV: 95.7 fL (ref 78.0–100.0)
PLATELETS: 223 10*3/uL (ref 150–400)
RBC: 4.22 MIL/uL (ref 4.22–5.81)
RDW: 12.7 % (ref 11.5–15.5)
WBC: 7.5 10*3/uL (ref 4.0–10.5)

## 2016-04-10 LAB — COMPREHENSIVE METABOLIC PANEL
ALK PHOS: 75 U/L (ref 38–126)
ALT: 20 U/L (ref 17–63)
AST: 23 U/L (ref 15–41)
Albumin: 3.6 g/dL (ref 3.5–5.0)
Anion gap: 7 (ref 5–15)
BUN: 28 mg/dL — AB (ref 6–20)
CALCIUM: 8.9 mg/dL (ref 8.9–10.3)
CHLORIDE: 109 mmol/L (ref 101–111)
CO2: 25 mmol/L (ref 22–32)
CREATININE: 1.51 mg/dL — AB (ref 0.61–1.24)
GFR, EST AFRICAN AMERICAN: 46 mL/min — AB (ref >60)
GFR, EST NON AFRICAN AMERICAN: 40 mL/min — AB (ref >60)
Glucose, Bld: 110 mg/dL — ABNORMAL HIGH (ref 65–99)
Potassium: 4.2 mmol/L (ref 3.5–5.1)
Sodium: 141 mmol/L (ref 135–145)
Total Bilirubin: 0.5 mg/dL (ref 0.3–1.2)
Total Protein: 5.7 g/dL — ABNORMAL LOW (ref 6.5–8.1)

## 2016-04-10 LAB — PROTIME-INR
INR: 1.22 (ref 0.00–1.49)
PROTHROMBIN TIME: 15.5 s — AB (ref 11.6–15.2)

## 2016-04-10 LAB — TYPE AND SCREEN
ABO/RH(D): A POS
Antibody Screen: NEGATIVE

## 2016-04-10 LAB — ABO/RH: ABO/RH(D): A POS

## 2016-04-10 LAB — SURGICAL PCR SCREEN
MRSA, PCR: NEGATIVE
Staphylococcus aureus: POSITIVE — AB

## 2016-04-10 LAB — APTT: aPTT: 29 seconds (ref 24–37)

## 2016-04-10 MED ORDER — CHLORHEXIDINE GLUCONATE CLOTH 2 % EX PADS: 6.0000 | MEDICATED_PAD | Freq: Once | CUTANEOUS | Status: DC

## 2016-04-10 NOTE — Progress Notes (Signed)
PCP is Marcial Pacas, DO  Cardiologist is Mertie Moores  Patient denied having any acute cardiac or pulmonary issues at this time, but did inform Nurse that he does become "Dizzy" at times. Patient also stated that he has had several cardiac caths with stent placement at [facility] in [place], and one cardiac cath at [facility] in [place]. Release form signed and placed on chart.  Nurse inquired about patient taking Phenobarbital and patient informed Nurse about 50 years ago he "passed out" in [place] and was given phenobarbital to take. Patient denied any passing out or seizure episodes thereafter.  Patient was accompanied to PAT by his son Legrand Como.    Patient was unable to provide a urinalysis during PAT visit as patient stated "I already went to the bathroom before I came." Nurse informed patient that a urine sample would be needed DOS. Patient verbalized understanding.

## 2016-04-10 NOTE — Progress Notes (Signed)
Prescription for Mupirocin was called to [facility]. Nurse then called patient and left a voicemail instructing patient to pick up ointment at his earliest convenience.

## 2016-04-12 NOTE — Progress Notes (Signed)
Anesthesia chart review: Patient is an 82 year old male scheduled for left carotid endarterectomy on 04/18/2016 by Dr. Trula Slade.  History includes former smoker, hypertension, CAD s/p stenting (in [place] and [place]), carotid artery stenosis, hypertension, insomnia, glaucoma, dizziness, hyperlipidemia, melanoma excision. 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  PCP is Dr. Marcial Pacas. Cardiologist is Dr. Mertie Moores.  Meds include aspirin 325 mg, Lipitor, Azopt ophthalmic, 65 FE, lisinopril-HCTZ, Toprol-XL, phenobarbital, Ambien. (He told his PAT RN that he was started on phenobarbital approximately 50 years ago in [place] after he "passed out." Dr. Lajoyce Lauber note indicates history of epilepsy.)  03/23/16 EKG: SR with first degree AV block, LAD.  04/06/16 Nuclear stress test:  Nuclear stress EF: 61%.  There was no ST segment deviation noted during stress.  Defect 1: There is a medium defect of severe severity present in the basal inferior, mid inferior and apex location.  This is a low risk study.  The left ventricular ejection fraction is normal (55-65%).  Low risk stress nuclear study with prior inferior and apical infarct vs thinning; no ischemia; EF 61 with normal wall motion. Results reviewed by Dr. Acie Fredrickson, who wrote, "Pt has a hx of CAD with stenting. I suspect that he has had a inferior wall NSTEMI. No ischemia. He is at low risk for his upcoming carotid endarterectomy."  03/12/16 Carotid U/S: IMPRESSION: 1. 60-79% right ICA stenosis. 2. 80-99% left ICA stenosis. 3. Left external carotid artery stenosis.  Preoperative labs noted. Cr 1.51 (previously 1.25-1.4 in 2015, 1.13-1.38 06/07/15-01/28/16 labs from [facility]). Glucose 110. CBC WNL. INR 1.22. T&S done. Patient was unable to provide a urine specimen at PAT, so will be done on the day of surgery. Cr is a little above his baseline based on labs available to me since 2015, so I will plan  to recheck on the day of surgery to evaluate for stability. (I also forwarded results to Dr. Trula Slade and V[facility] RN Colletta Maryland and notified them that UA to be done on the DOS.)  Brad James [facility] Short Stay Center/Anesthesiology Phone [phone] 04/12/2016 3:08 PM

## 2016-04-14 ENCOUNTER — Other Ambulatory Visit: Payer: Self-pay | Admitting: Family Medicine

## 2016-04-18 ENCOUNTER — Inpatient Hospital Stay (HOSPITAL_COMMUNITY)
Admission: RE | Admit: 2016-04-18 | Discharge: 2016-04-19 | DRG: 038 | Disposition: A | Discharge status: Home / Self Care | Payer: Medicare HMO | Source: Ambulatory Visit | Attending: Surgery | Admitting: Surgery

## 2016-04-18 ENCOUNTER — Encounter (HOSPITAL_COMMUNITY)
Admission: RE | Disposition: A | Discharge status: Home / Self Care | Payer: Self-pay | Source: Ambulatory Visit | Attending: Surgery

## 2016-04-18 ENCOUNTER — Inpatient Hospital Stay (HOSPITAL_COMMUNITY): Payer: Medicare HMO | Admitting: Vascular Surgery

## 2016-04-18 ENCOUNTER — Encounter (HOSPITAL_COMMUNITY): Payer: Self-pay | Admitting: Certified Registered Nurse Anesthetist

## 2016-04-18 DIAGNOSIS — Z87891 Personal history of nicotine dependence: Secondary | ICD-10-CM

## 2016-04-18 DIAGNOSIS — Z7982 Long term (current) use of aspirin: Secondary | ICD-10-CM

## 2016-04-18 DIAGNOSIS — D62 Acute posthemorrhagic anemia: Secondary | ICD-10-CM | POA: Diagnosis not present

## 2016-04-18 DIAGNOSIS — H409 Unspecified glaucoma: Secondary | ICD-10-CM | POA: Diagnosis present

## 2016-04-18 DIAGNOSIS — I2510 Atherosclerotic heart disease of native coronary artery without angina pectoris: Secondary | ICD-10-CM | POA: Diagnosis present

## 2016-04-18 DIAGNOSIS — I10 Essential (primary) hypertension: Secondary | ICD-10-CM | POA: Diagnosis present

## 2016-04-18 DIAGNOSIS — I6523 Occlusion and stenosis of bilateral carotid arteries: Principal | ICD-10-CM | POA: Diagnosis present

## 2016-04-18 DIAGNOSIS — I6529 Occlusion and stenosis of unspecified carotid artery: Secondary | ICD-10-CM | POA: Diagnosis present

## 2016-04-18 DIAGNOSIS — E785 Hyperlipidemia, unspecified: Secondary | ICD-10-CM | POA: Diagnosis present

## 2016-04-18 DIAGNOSIS — Z955 Presence of coronary angioplasty implant and graft: Secondary | ICD-10-CM | POA: Diagnosis not present

## 2016-04-18 DIAGNOSIS — I6522 Occlusion and stenosis of left carotid artery: Secondary | ICD-10-CM | POA: Diagnosis not present

## 2016-04-18 DIAGNOSIS — E7800 Pure hypercholesterolemia, unspecified: Secondary | ICD-10-CM | POA: Diagnosis present

## 2016-04-18 DIAGNOSIS — R55 Syncope and collapse: Secondary | ICD-10-CM | POA: Diagnosis present

## 2016-04-18 HISTORY — PX: ENDARTERECTOMY: SHX5162

## 2016-04-18 HISTORY — PX: PATCH ANGIOPLASTY: SHX6230

## 2016-04-18 LAB — URINALYSIS, ROUTINE W REFLEX MICROSCOPIC
Bilirubin Urine: NEGATIVE
Glucose, UA: NEGATIVE mg/dL
Hgb urine dipstick: NEGATIVE
Ketones, ur: NEGATIVE mg/dL
LEUKOCYTES UA: NEGATIVE
NITRITE: NEGATIVE
PH: 5.5 (ref 5.0–8.0)
Protein, ur: NEGATIVE mg/dL
SPECIFIC GRAVITY, URINE: 1.022 (ref 1.005–1.030)

## 2016-04-18 LAB — CBC
HEMATOCRIT: 36.9 % — AB (ref 39.0–52.0)
Hemoglobin: 11.9 g/dL — ABNORMAL LOW (ref 13.0–17.0)
MCH: 31.1 pg (ref 26.0–34.0)
MCHC: 32.2 g/dL (ref 30.0–36.0)
MCV: 96.3 fL (ref 78.0–100.0)
Platelets: 194 10*3/uL (ref 150–400)
RBC: 3.83 MIL/uL — ABNORMAL LOW (ref 4.22–5.81)
RDW: 13.1 % (ref 11.5–15.5)
WBC: 12.6 10*3/uL — ABNORMAL HIGH (ref 4.0–10.5)

## 2016-04-18 LAB — POCT I-STAT, CHEM 8
BUN: 30 mg/dL — ABNORMAL HIGH (ref 6–20)
CALCIUM ION: 1.17 mmol/L (ref 1.12–1.23)
Chloride: 105 mmol/L (ref 101–111)
Creatinine, Ser: 1.6 mg/dL — ABNORMAL HIGH (ref 0.61–1.24)
GLUCOSE: 103 mg/dL — AB (ref 65–99)
HCT: 41 % (ref 39.0–52.0)
Hemoglobin: 13.9 g/dL (ref 13.0–17.0)
Potassium: 4 mmol/L (ref 3.5–5.1)
SODIUM: 143 mmol/L (ref 135–145)
TCO2: 25 mmol/L (ref 0–100)

## 2016-04-18 LAB — CREATININE, SERUM
Creatinine, Ser: 1.52 mg/dL — ABNORMAL HIGH (ref 0.61–1.24)
GFR calc Af Amer: 46 mL/min — ABNORMAL LOW (ref >60)
GFR, EST NON AFRICAN AMERICAN: 40 mL/min — AB (ref >60)

## 2016-04-18 SURGERY — ENDARTERECTOMY, CAROTID
Anesthesia: General | Site: Neck | Laterality: Left

## 2016-04-18 MED ORDER — METOPROLOL SUCCINATE ER 50 MG PO TB24
100.0000 mg | ORAL_TABLET | Freq: Every day | ORAL | Status: DC
  Administered 2016-04-19: 100 mg via ORAL
  Filled 2016-04-18: qty 2

## 2016-04-18 MED ORDER — PROPOFOL 10 MG/ML IV BOLUS
INTRAVENOUS | Status: DC | PRN
  Administered 2016-04-18: 100 mg via INTRAVENOUS
  Administered 2016-04-18: 40 mg via INTRAVENOUS

## 2016-04-18 MED ORDER — BISACODYL 5 MG PO TBEC: 5.0000 mg | DELAYED_RELEASE_TABLET | Freq: Every day | ORAL | Status: DC | PRN

## 2016-04-18 MED ORDER — ACETAMINOPHEN 325 MG PO TABS: 325.0000 mg | ORAL_TABLET | ORAL | Status: DC | PRN

## 2016-04-18 MED ORDER — HYDRALAZINE HCL 20 MG/ML IJ SOLN: 5.0000 mg | INTRAMUSCULAR | Status: DC | PRN

## 2016-04-18 MED ORDER — ENOXAPARIN SODIUM 30 MG/0.3ML ~~LOC~~ SOLN: 30.0000 mg | SUBCUTANEOUS | Status: DC

## 2016-04-18 MED ORDER — ONDANSETRON HCL 4 MG/2ML IJ SOLN
INTRAMUSCULAR | Status: DC | PRN
  Administered 2016-04-18: 4 mg via INTRAVENOUS

## 2016-04-18 MED ORDER — PROMETHAZINE HCL 25 MG/ML IJ SOLN
6.2500 mg | INTRAMUSCULAR | Status: DC | PRN
  Administered 2016-04-18: 6.25 mg via INTRAVENOUS

## 2016-04-18 MED ORDER — 0.9 % SODIUM CHLORIDE (POUR BTL) OPTIME
TOPICAL | Status: DC | PRN
  Administered 2016-04-18: 2000 mL

## 2016-04-18 MED ORDER — FERROUS SULFATE 325 (65 FE) MG PO TABS
325.0000 mg | ORAL_TABLET | Freq: Every day | ORAL | Status: DC
  Administered 2016-04-19: 325 mg via ORAL
  Filled 2016-04-18: qty 1

## 2016-04-18 MED ORDER — DEXTROSE 5 % IV SOLN
1.5000 g | Freq: Two times a day (BID) | INTRAVENOUS | Status: DC
  Filled 2016-04-18: qty 1.5

## 2016-04-18 MED ORDER — ROCURONIUM BROMIDE 50 MG/5ML IV SOLN
INTRAVENOUS | Status: AC
  Filled 2016-04-18: qty 1

## 2016-04-18 MED ORDER — ALBUMIN HUMAN 5 % IV SOLN
12.5000 g | Freq: Once | INTRAVENOUS | Status: AC
  Administered 2016-04-18: 12.5 g via INTRAVENOUS

## 2016-04-18 MED ORDER — POTASSIUM CHLORIDE CRYS ER 20 MEQ PO TBCR: 20.0000 meq | EXTENDED_RELEASE_TABLET | Freq: Every day | ORAL | Status: DC | PRN

## 2016-04-18 MED ORDER — SODIUM CHLORIDE 0.9 % IV SOLN: INTRAVENOUS | Status: DC

## 2016-04-18 MED ORDER — ONDANSETRON HCL 4 MG/2ML IJ SOLN
INTRAMUSCULAR | Status: AC
  Filled 2016-04-18: qty 2

## 2016-04-18 MED ORDER — SUGAMMADEX SODIUM 200 MG/2ML IV SOLN
INTRAVENOUS | Status: AC
  Filled 2016-04-18: qty 2

## 2016-04-18 MED ORDER — PHENYLEPHRINE HCL 10 MG/ML IJ SOLN
INTRAVENOUS | Status: DC | PRN
  Administered 2016-04-18: 40 ug/min via INTRAVENOUS

## 2016-04-18 MED ORDER — DEXTROSE 5 % IV SOLN
1.5000 g | INTRAVENOUS | Status: AC
  Administered 2016-04-18: 1.5 g via INTRAVENOUS
  Filled 2016-04-18: qty 1.5

## 2016-04-18 MED ORDER — ACETAMINOPHEN 325 MG RE SUPP: 325.0000 mg | RECTAL | Status: DC | PRN

## 2016-04-18 MED ORDER — PROPOFOL 10 MG/ML IV BOLUS
INTRAVENOUS | Status: AC
  Filled 2016-04-18: qty 20

## 2016-04-18 MED ORDER — ALBUMIN HUMAN 5 % IV SOLN
INTRAVENOUS | Status: AC
  Administered 2016-04-18: 12.5 g via INTRAVENOUS
  Filled 2016-04-18: qty 250

## 2016-04-18 MED ORDER — SODIUM CHLORIDE 0.9 % IV SOLN
500.0000 mL | Freq: Once | INTRAVENOUS | Status: DC | PRN
Start: 2016-04-18 — End: 2016-04-19

## 2016-04-18 MED ORDER — ZOLPIDEM TARTRATE 5 MG PO TABS
10.0000 mg | ORAL_TABLET | Freq: Every evening | ORAL | Status: DC | PRN
Start: 2016-04-18 — End: 2016-04-18

## 2016-04-18 MED ORDER — DOCUSATE SODIUM 100 MG PO CAPS
100.0000 mg | ORAL_CAPSULE | Freq: Every day | ORAL | Status: DC
  Administered 2016-04-19: 100 mg via ORAL
  Filled 2016-04-18: qty 1

## 2016-04-18 MED ORDER — MAGNESIUM SULFATE 2 GM/50ML IV SOLN: 2.0000 g | Freq: Every day | INTRAVENOUS | Status: DC | PRN

## 2016-04-18 MED ORDER — OXYCODONE-ACETAMINOPHEN 5-325 MG PO TABS
1.0000 | ORAL_TABLET | ORAL | Status: DC | PRN
  Administered 2016-04-18 – 2016-04-19 (×2): 2 via ORAL
  Filled 2016-04-18 (×2): qty 2

## 2016-04-18 MED ORDER — EPHEDRINE 5 MG/ML INJ
INTRAVENOUS | Status: AC
  Filled 2016-04-18: qty 10

## 2016-04-18 MED ORDER — SODIUM CHLORIDE 0.9 % IV SOLN
INTRAVENOUS | Status: DC
  Administered 2016-04-18: 14:00:00 via INTRAVENOUS

## 2016-04-18 MED ORDER — DEXTROSE 5 % IV SOLN
1.5000 g | Freq: Four times a day (QID) | INTRAVENOUS | Status: AC
  Administered 2016-04-18 (×2): 1.5 g via INTRAVENOUS
  Filled 2016-04-18 (×2): qty 1.5

## 2016-04-18 MED ORDER — ALUM & MAG HYDROXIDE-SIMETH 200-200-20 MG/5ML PO SUSP: 15.0000 mL | ORAL | Status: DC | PRN

## 2016-04-18 MED ORDER — HYDROMORPHONE HCL 1 MG/ML IJ SOLN
INTRAMUSCULAR | Status: AC
  Filled 2016-04-18: qty 1

## 2016-04-18 MED ORDER — ASPIRIN EC 325 MG PO TBEC
325.0000 mg | DELAYED_RELEASE_TABLET | Freq: Every day | ORAL | Status: DC
  Administered 2016-04-18 – 2016-04-19 (×2): 325 mg via ORAL
  Filled 2016-04-18 (×2): qty 1

## 2016-04-18 MED ORDER — LISINOPRIL 20 MG PO TABS
20.0000 mg | ORAL_TABLET | Freq: Every day | ORAL | Status: DC
  Administered 2016-04-19: 20 mg via ORAL
  Filled 2016-04-18: qty 1

## 2016-04-18 MED ORDER — PHENOBARBITAL 32.4 MG PO TABS
64.8000 mg | ORAL_TABLET | Freq: Every day | ORAL | Status: DC
  Administered 2016-04-18: 64.8 mg via ORAL
  Filled 2016-04-18 (×2): qty 2

## 2016-04-18 MED ORDER — LIDOCAINE HCL (CARDIAC) 20 MG/ML IV SOLN
INTRAVENOUS | Status: DC | PRN
  Administered 2016-04-18: 80 mg via INTRAVENOUS

## 2016-04-18 MED ORDER — ARTIFICIAL TEARS OP OINT
TOPICAL_OINTMENT | OPHTHALMIC | Status: DC | PRN
  Administered 2016-04-18: 1 via OPHTHALMIC

## 2016-04-18 MED ORDER — ZOLPIDEM TARTRATE 5 MG PO TABS: 5.0000 mg | ORAL_TABLET | Freq: Every evening | ORAL | Status: DC | PRN

## 2016-04-18 MED ORDER — PROMETHAZINE HCL 25 MG/ML IJ SOLN
INTRAMUSCULAR | Status: AC
  Filled 2016-04-18: qty 1

## 2016-04-18 MED ORDER — MORPHINE SULFATE (PF) 2 MG/ML IV SOLN
2.0000 mg | INTRAVENOUS | Status: DC | PRN
  Administered 2016-04-18 (×3): 2 mg via INTRAVENOUS
  Filled 2016-04-18 (×3): qty 1

## 2016-04-18 MED ORDER — ROCURONIUM BROMIDE 100 MG/10ML IV SOLN
INTRAVENOUS | Status: DC | PRN
  Administered 2016-04-18: 20 mg via INTRAVENOUS
  Administered 2016-04-18: 50 mg via INTRAVENOUS
  Administered 2016-04-18: 10 mg via INTRAVENOUS
  Administered 2016-04-18 (×2): 20 mg via INTRAVENOUS

## 2016-04-18 MED ORDER — ONDANSETRON HCL 4 MG/2ML IJ SOLN: 4.0000 mg | Freq: Four times a day (QID) | INTRAMUSCULAR | Status: DC | PRN

## 2016-04-18 MED ORDER — SODIUM CHLORIDE 0.9 % IV BOLUS (SEPSIS): 500.0000 mL | Freq: Once | INTRAVENOUS | Status: DC

## 2016-04-18 MED ORDER — HYDROCHLOROTHIAZIDE 25 MG PO TABS
25.0000 mg | ORAL_TABLET | Freq: Every day | ORAL | Status: DC
  Administered 2016-04-19: 25 mg via ORAL
  Filled 2016-04-18: qty 1

## 2016-04-18 MED ORDER — BRINZOLAMIDE 1 % OP SUSP
1.0000 [drp] | Freq: Two times a day (BID) | OPHTHALMIC | Status: DC
  Administered 2016-04-18 – 2016-04-19 (×2): 1 [drp] via OPHTHALMIC
  Filled 2016-04-18: qty 10

## 2016-04-18 MED ORDER — PHENYLEPHRINE 40 MCG/ML (10ML) SYRINGE FOR IV PUSH (FOR BLOOD PRESSURE SUPPORT)
PREFILLED_SYRINGE | INTRAVENOUS | Status: AC
  Filled 2016-04-18: qty 10

## 2016-04-18 MED ORDER — SODIUM CHLORIDE 0.9 % IV SOLN
INTRAVENOUS | Status: DC | PRN
  Administered 2016-04-18: 500 mL

## 2016-04-18 MED ORDER — LACTATED RINGERS IV SOLN
INTRAVENOUS | Status: DC | PRN
  Administered 2016-04-18: 08:00:00 via INTRAVENOUS

## 2016-04-18 MED ORDER — SUGAMMADEX SODIUM 200 MG/2ML IV SOLN
INTRAVENOUS | Status: DC | PRN
  Administered 2016-04-18: 200 mg via INTRAVENOUS

## 2016-04-18 MED ORDER — LIDOCAINE HCL (PF) 1 % IJ SOLN
INTRAMUSCULAR | Status: AC
  Filled 2016-04-18: qty 30

## 2016-04-18 MED ORDER — GLYCOPYRROLATE 0.2 MG/ML IV SOSY
PREFILLED_SYRINGE | INTRAVENOUS | Status: AC
  Filled 2016-04-18: qty 3

## 2016-04-18 MED ORDER — GLYCOPYRROLATE 0.2 MG/ML IJ SOLN
INTRAMUSCULAR | Status: DC | PRN
  Administered 2016-04-18: 0.2 mg via INTRAVENOUS

## 2016-04-18 MED ORDER — PROTAMINE SULFATE 10 MG/ML IV SOLN
INTRAVENOUS | Status: DC | PRN
  Administered 2016-04-18: 50 mg via INTRAVENOUS

## 2016-04-18 MED ORDER — LISINOPRIL-HYDROCHLOROTHIAZIDE 20-25 MG PO TABS: 0.5000 | ORAL_TABLET | Freq: Every day | ORAL | Status: DC

## 2016-04-18 MED ORDER — HEMOSTATIC AGENTS (NO CHARGE) OPTIME
TOPICAL | Status: DC | PRN
  Administered 2016-04-18: 1 via TOPICAL

## 2016-04-18 MED ORDER — ATORVASTATIN CALCIUM 80 MG PO TABS
80.0000 mg | ORAL_TABLET | Freq: Every day | ORAL | Status: DC
  Administered 2016-04-18 – 2016-04-19 (×2): 80 mg via ORAL
  Filled 2016-04-18 (×2): qty 1

## 2016-04-18 MED ORDER — EPHEDRINE SULFATE 50 MG/ML IJ SOLN
INTRAMUSCULAR | Status: DC | PRN
  Administered 2016-04-18: 2.5 mg via INTRAVENOUS
  Administered 2016-04-18: 5 mg via INTRAVENOUS

## 2016-04-18 MED ORDER — FENTANYL CITRATE (PF) 250 MCG/5ML IJ SOLN
INTRAMUSCULAR | Status: AC
  Filled 2016-04-18: qty 5

## 2016-04-18 MED ORDER — HEPARIN SODIUM (PORCINE) 1000 UNIT/ML IJ SOLN
INTRAMUSCULAR | Status: AC
  Filled 2016-04-18: qty 1

## 2016-04-18 MED ORDER — HEPARIN SODIUM (PORCINE) 1000 UNIT/ML IJ SOLN
INTRAMUSCULAR | Status: DC | PRN
Start: 2016-04-18 — End: 2016-04-18
  Administered 2016-04-18: 1000 [IU] via INTRAVENOUS
  Administered 2016-04-18: 8000 [IU] via INTRAVENOUS

## 2016-04-18 MED ORDER — METOPROLOL TARTRATE 5 MG/5ML IV SOLN: 2.0000 mg | INTRAVENOUS | Status: DC | PRN

## 2016-04-18 MED ORDER — ROCURONIUM BROMIDE 50 MG/5ML IV SOLN
INTRAVENOUS | Status: AC
  Filled 2016-04-18: qty 2

## 2016-04-18 MED ORDER — SENNOSIDES-DOCUSATE SODIUM 8.6-50 MG PO TABS: 1.0000 | ORAL_TABLET | Freq: Every evening | ORAL | Status: DC | PRN

## 2016-04-18 MED ORDER — GUAIFENESIN-DM 100-10 MG/5ML PO SYRP: 15.0000 mL | ORAL_SOLUTION | ORAL | Status: DC | PRN

## 2016-04-18 MED ORDER — ARTIFICIAL TEARS OP OINT
TOPICAL_OINTMENT | OPHTHALMIC | Status: AC
  Filled 2016-04-18: qty 3.5

## 2016-04-18 MED ORDER — FENTANYL CITRATE (PF) 100 MCG/2ML IJ SOLN
INTRAMUSCULAR | Status: DC | PRN
  Administered 2016-04-18 (×2): 25 ug via INTRAVENOUS
  Administered 2016-04-18 (×2): 50 ug via INTRAVENOUS
  Administered 2016-04-18 (×2): 25 ug via INTRAVENOUS

## 2016-04-18 MED ORDER — PANTOPRAZOLE SODIUM 40 MG PO TBEC
40.0000 mg | DELAYED_RELEASE_TABLET | Freq: Every day | ORAL | Status: DC
  Administered 2016-04-18 – 2016-04-19 (×2): 40 mg via ORAL
  Filled 2016-04-18: qty 1

## 2016-04-18 MED ORDER — LIDOCAINE 2% (20 MG/ML) 5 ML SYRINGE
INTRAMUSCULAR | Status: AC
  Filled 2016-04-18: qty 5

## 2016-04-18 MED ORDER — LABETALOL HCL 5 MG/ML IV SOLN: 10.0000 mg | INTRAVENOUS | Status: DC | PRN

## 2016-04-18 MED ORDER — HYDROMORPHONE HCL 1 MG/ML IJ SOLN
0.2500 mg | INTRAMUSCULAR | Status: DC | PRN
  Administered 2016-04-18: 0.25 mg via INTRAVENOUS

## 2016-04-18 MED ORDER — PHENOL 1.4 % MT LIQD
1.0000 | OROMUCOSAL | Status: DC | PRN
Start: 2016-04-18 — End: 2016-04-19

## 2016-04-18 MED ORDER — PROMETHAZINE HCL 25 MG/ML IJ SOLN: 6.2500 mg | INTRAMUSCULAR | Status: DC | PRN

## 2016-04-18 SURGICAL SUPPLY — 47 items
CANISTER SUCTION 2500CC (MISCELLANEOUS) ×2 IMPLANT
CATH ROBINSON RED A/P 18FR (CATHETERS) ×2 IMPLANT
CATH SUCT 10FR WHISTLE TIP (CATHETERS) ×2 IMPLANT
CLIP TI MEDIUM 6 (CLIP) ×2 IMPLANT
CLIP TI WIDE RED SMALL 6 (CLIP) ×2 IMPLANT
CRADLE DONUT ADULT HEAD (MISCELLANEOUS) ×2 IMPLANT
DRAIN CHANNEL 15F RND FF W/TCR (WOUND CARE) IMPLANT
ELECT REM PT RETURN 9FT ADLT (ELECTROSURGICAL) ×2
ELECTRODE REM PT RTRN 9FT ADLT (ELECTROSURGICAL) ×1 IMPLANT
EVACUATOR SILICONE 100CC (DRAIN) IMPLANT
GAUZE SPONGE 4X4 12PLY STRL (GAUZE/BANDAGES/DRESSINGS) IMPLANT
GLOVE BIOGEL PI IND STRL 6.5 (GLOVE) ×2 IMPLANT
GLOVE BIOGEL PI IND STRL 7.5 (GLOVE) ×2 IMPLANT
GLOVE BIOGEL PI INDICATOR 6.5 (GLOVE) ×2
GLOVE BIOGEL PI INDICATOR 7.5 (GLOVE) ×2
GLOVE ECLIPSE 6.5 STRL STRAW (GLOVE) ×2 IMPLANT
GLOVE ECLIPSE 7.0 STRL STRAW (GLOVE) ×2 IMPLANT
GLOVE SURG SS PI 7.5 STRL IVOR (GLOVE) ×2 IMPLANT
GOWN STRL REUS W/ TWL LRG LVL3 (GOWN DISPOSABLE) ×2 IMPLANT
GOWN STRL REUS W/ TWL XL LVL3 (GOWN DISPOSABLE) ×1 IMPLANT
GOWN STRL REUS W/TWL LRG LVL3 (GOWN DISPOSABLE) ×2
GOWN STRL REUS W/TWL XL LVL3 (GOWN DISPOSABLE) ×1
HEMOSTAT SNOW SURGICEL 2X4 (HEMOSTASIS) IMPLANT
INSERT FOGARTY SM (MISCELLANEOUS) IMPLANT
KIT BASIN OR (CUSTOM PROCEDURE TRAY) ×2 IMPLANT
KIT ROOM TURNOVER OR (KITS) ×2 IMPLANT
LIQUID BAND (GAUZE/BANDAGES/DRESSINGS) ×2 IMPLANT
NEEDLE HYPO 25GX1X1/2 BEV (NEEDLE) ×2 IMPLANT
NS IRRIG 1000ML POUR BTL (IV SOLUTION) ×4 IMPLANT
PACK CAROTID (CUSTOM PROCEDURE TRAY) ×2 IMPLANT
PAD ARMBOARD 7.5X6 YLW CONV (MISCELLANEOUS) ×4 IMPLANT
PATCH VASC XENOSURE 1CMX6CM (Vascular Products) ×1 IMPLANT
PATCH VASC XENOSURE 1X6 (Vascular Products) ×1 IMPLANT
SHUNT CAROTID BYPASS 10 (VASCULAR PRODUCTS) IMPLANT
SHUNT CAROTID BYPASS 12FRX15.5 (VASCULAR PRODUCTS) IMPLANT
SPONGE INTESTINAL PEANUT (DISPOSABLE) ×2 IMPLANT
SUT ETHILON 3 0 PS 1 (SUTURE) IMPLANT
SUT PROLENE 6 0 BV (SUTURE) ×14 IMPLANT
SUT PROLENE 7 0 BV 1 (SUTURE) IMPLANT
SUT PROLENE 7 0 BV1 MDA (SUTURE) ×2 IMPLANT
SUT SILK 3 0 TIES 17X18 (SUTURE)
SUT SILK 3-0 18XBRD TIE BLK (SUTURE) IMPLANT
SUT VIC AB 3-0 SH 27 (SUTURE) ×2
SUT VIC AB 3-0 SH 27X BRD (SUTURE) ×2 IMPLANT
SUT VICRYL 4-0 PS2 18IN ABS (SUTURE) ×2 IMPLANT
SYR CONTROL 10ML LL (SYRINGE) ×2 IMPLANT
WATER STERILE IRR 1000ML POUR (IV SOLUTION) ×2 IMPLANT

## 2016-04-18 NOTE — Anesthesia Preprocedure Evaluation (Addendum)
Anesthesia Evaluation  Patient identified by MRN, date of birth, ID band Patient awake    Reviewed: Allergy & Precautions, NPO status , Patient's Chart, lab work & pertinent test results  Airway Mallampati: II  TM Distance: >3 FB Neck ROM: Full    Dental  (+) Teeth Intact, Dental Advisory Given   Pulmonary former smoker,    breath sounds clear to auscultation       Cardiovascular hypertension, + CAD and + Peripheral Vascular Disease   Rhythm:Regular Rate:Normal     Neuro/Psych    GI/Hepatic Neg liver ROS,   Endo/Other  negative endocrine ROS  Renal/GU Renal diseasenegative Renal ROS     Musculoskeletal   Abdominal   Peds negative pediatric ROS (+)  Hematology negative hematology ROS (+)   Anesthesia Other Findings   Reproductive/Obstetrics                            Anesthesia Physical Anesthesia Plan  ASA: III  Anesthesia Plan: General   Post-op Pain Management:    Induction: Intravenous  Airway Management Planned: Oral ETT  Additional Equipment: Arterial line  Intra-op Plan:   Post-operative Plan: Extubation in OR  Informed Consent: I have reviewed the patients History and Physical, chart, labs and discussed the procedure including the risks, benefits and alternatives for the proposed anesthesia with the patient or authorized representative who has indicated his/her understanding and acceptance.   Dental advisory given  Plan Discussed with: CRNA  Anesthesia Plan Comments:         Anesthesia Quick Evaluation

## 2016-04-18 NOTE — Op Note (Signed)
Patient name: Brad James MRN: [id number] DOB: [birth year] Sex: male  04/18/2016 Pre-operative Diagnosis: Asymptomatic   left carotid stenosis Post-operative diagnosis:  Same Surgeon:  Annamarie Major Assistants:  Lennie Muckle Procedure:    left carotid Endarterectomy with bovine pericardial patch angioplasty Anesthesia:  General Blood Loss:  See anesthesia record Specimens:  Carotid Plaque to pathology  Findings:  85 %stenosis; Thrombus:  none  Indications:  82 year old with dizziness who was found to have bilateral carotid stenosis, left greater than right  Procedure:  The patient was identified in the holding area and taken to [facility]  The patient was then placed supine on the table.   General endotrachial anesthesia was administered.  The patient was prepped and draped in the usual sterile fashion.  A time out was called and antibiotics were administered.  The incision was made along the anterior border of the left sternocleidomastoid muscle.  Cautery was used to dissect through the subcutaneous tissue.  The platysma muscle was divided with cautery.  The internal jugular vein was exposed along its anterior medial border.  The common facial vein was exposed and then divided between 2-0 silk ties and metal clips.  The common carotid artery was then circumferentially exposed and encircled with an umbilical tape.  The vagus nerve was identified and protected.  Next sharp dissection was used to expose the external carotid artery and the superior thyroid artery.  The were encircled with a blue vessel loop and a 2-0 silk tie respectively.  Finally, the internal carotid was carefully dissected free.  An umbilical tape was placed around the internal carotid artery distal to the diseased segment.  The hypoglossal nerve was visualized throughout and protected.  The patient was given systemic heparinization.  A bovine carotid patch was selected and prepared on the back table.  A 10 french shunt was  also prepared.  After blood pressure readings were appropriate and the heparin had been given time to circulate, the internal carotid artery was occluded with a baby Gregory clamp.  The external and common carotid arteries were then occluded with vascular clamps and the 2-0 tie tightened on the superior thyroid artery.  A #11 blade was used to make an arteriotomy in the common carotid artery.  This was extended with Potts scissors along the anterior and lateral border of the common and internal carotid artery.  Approximately 85% stenosis was identified.  There was no thrombus identified.  The 10 french shunt was not placed due to excellent backbleeding.  A kleiner kuntz elevator was used to perform endarterectomy.  An eversion endarterectomy was performed in the external carotid artery.  A good distal endpoint was obtained in the internal carotid artery.  The specimen was removed and sent to pathology.  Heparinized saline was used to irrigate the endarterectomized field.  All potential embolic debris was removed.  Bovine pericardial patch angioplasty was then performed using a running 6-0 Prolene. The common internal and external carotid arteries were all appropriately flushed. The artery was again irrigated with heparin saline.  The anastomosis was then secured. The clamp was first released on the external carotid artery followed by the common carotid artery approximately 30 seconds later, bloodflow was reestablish through the internal carotid artery.  Next, a hand-held  Doppler was used to evaluate the signals in the common, external, and internal  carotid arteries, all of which had appropriate signals. I then administered  50 mg protamine. The wound was then irrigated.  After  hemostasis was achieved, the carotid sheath was reapproximated with 3-0 Vicryl. The  platysma muscle was reapproximated with running 3-0 Vicryl. The skin  was closed with 4-0 Vicryl. Dermabond was placed on the skin. The  patient was  then successfully extubated. His neurologic exam was  similar to his preprocedural exam. The patient was then taken to recovery room  in stable condition. There were no complications.     Disposition:  To PACU in stable condition.  Relevant Operative Details:  Normal anatomy.  Shunt not utilized because exposure was difficult and he had excellent back bleeding  V. Annamarie Major, M.D. [facility] [place] Office: [phone] Pager:  [phone]

## 2016-04-18 NOTE — Anesthesia Postprocedure Evaluation (Signed)
Anesthesia Post Note  Patient: Brad James  Procedure(s) Performed: Procedure(s) (LRB): LEFT CAROTID ENDARTERECTOMY (Left) PATCH ANGIOPLASTY USING XENOSURE BIOLOGIC PATCH (Left)  Patient location during evaluation: PACU Anesthesia Type: General Level of consciousness: awake and alert Pain management: pain level controlled Vital Signs Assessment: post-procedure vital signs reviewed and stable Respiratory status: spontaneous breathing, nonlabored ventilation, respiratory function stable and patient connected to nasal cannula oxygen Cardiovascular status: blood pressure returned to baseline and stable Postop Assessment: no signs of nausea or vomiting Anesthetic complications: no    Last Vitals:  Vitals:   04/18/16 1430 04/18/16 1500  BP: (!) 117/58 129/67  Pulse: 72 78  Resp: 14 14  Temp:  36.8 C    Last Pain:  Vitals:   04/18/16 1500  TempSrc: Oral  PainSc:                  Kimyatta Lecy,JAMES TERRILL

## 2016-04-18 NOTE — Progress Notes (Signed)
  Day of Surgery Note    Subjective:  C/o sore throat, but no trouble swallowing and is not choking  Vitals:   04/18/16 1430 04/18/16 1500  BP: (!) 117/58 129/67  Pulse: 72 78  Resp: 14 14  Temp:  98.3 F (36.8 C)    Incisions:   Clean and dry Extremities:  Moving all extremities equally Lungs:  Non labored Neuro:  In tact; tongue is midline   Assessment/Plan:  This is a 82 y.o. male who is s/p  left carotid Endarterectomy with bovine pericardial patch angioplasty  -pt doing well and neurologically in tact & tongue is midline -if no issues tonight-should dc home tomorrow   Leontine Locket, PA-C 04/18/2016 4:45 PM [phone]

## 2016-04-18 NOTE — Anesthesia Procedure Notes (Signed)
Procedure Name: Intubation Date/Time: 04/18/2016 8:40 AM Performed by: Tressia Miners LEFFEW Pre-anesthesia Checklist: Patient identified, Patient being monitored, Timeout performed, Emergency Drugs available and Suction available Patient Re-evaluated:Patient Re-evaluated prior to inductionOxygen Delivery Method: Circle System Utilized Preoxygenation: Pre-oxygenation with 100% oxygen Intubation Type: IV induction Ventilation: Mask ventilation without difficulty Laryngoscope Size: Mac and 4 Grade View: Grade I Tube type: Oral Tube size: 7.5 mm Number of attempts: 1 Airway Equipment and Method: Stylet Placement Confirmation: ETT inserted through vocal cords under direct vision,  positive ETCO2 and breath sounds checked- equal and bilateral Secured at: 23 cm Tube secured with: Tape Dental Injury: Teeth and Oropharynx as per pre-operative assessment  Comments: DL by Fernanda Drum

## 2016-04-18 NOTE — Transfer of Care (Signed)
Immediate Anesthesia Transfer of Care Note  Patient: Brad James  Procedure(s) Performed: Procedure(s): LEFT CAROTID ENDARTERECTOMY (Left) PATCH ANGIOPLASTY USING XENOSURE BIOLOGIC PATCH (Left)  Patient Location: PACU  Anesthesia Type:General  Level of Consciousness: awake, alert  and responds to stimulation  Airway & Oxygen Therapy: Patient Spontanous Breathing and Patient connected to nasal cannula oxygen  Post-op Assessment: Report given to RN, Post -op Vital signs reviewed and stable, Patient moving all extremities X 4 and Patient able to stick tongue midline  Post vital signs: Reviewed and stable  Last Vitals:  Vitals:   04/18/16 0658 04/18/16 1135  BP: (!) 181/68   Pulse: 66 71  Resp: 18 16  Temp: 36.6 C     Last Pain:  Vitals:   04/18/16 1135  TempSrc:   PainSc: 0-No pain      Patients Stated Pain Goal: 4 (123XX123 [id number])  Complications: No apparent anesthesia complications

## 2016-04-18 NOTE — H&P (Signed)
[facility]  Patient name: Brad James                      MRN: [id number]                  DOB: 02-Mar-1927                    Sex: male  REFERRING PHYSICIAN: Dr. Ileene Rubens  REASON FOR CONSULT: carotid stenosis  HPI: Brad James is a 82 y.o. male, who is referred today for evaluation of carotid stenosis.  The patient has a history of dizziness and presyncopal episodes.  He also has known carotid stenosis.  This was recently checked with ultrasound which showed greater than 70% bilateral stenosis.  He denies any localizing symptoms such as numbness or weakness in either extremity, slurred speech, or amaurosis fugax.  The patient suffers from hypercholesterolemia which is managed with a statin.  His blood pressure medications were recently manipulated.  He is now on a beta blocker and ACE inhibitor.  He has a history of coronary artery disease and has been stented 3 times and [place].  He states he has never had a heart attack.  He is a former smoker and quit in 1970.  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  He is retired      Past Medical History  Diagnosis Date  . Hypertension   . CAD (coronary artery disease) 01/04/2014  . Carotid artery stenosis 01/04/2014  . Essential hypertension, benign 01/04/2014  . Hyperlipidemia 01/04/2014  . Insomnia 01/04/2014          Family History  Problem Relation Age of Onset  . Heart disease Mother   . Cancer Father 50    oral  . Stroke Brother 58    SOCIAL HISTORY: Social History        Social History  . Marital Status: Married    Spouse Name: N/A  . Number of Children: N/A  . Years of Education: N/A      Occupational History  . Not on file.        Social History Main Topics  . Smoking status: Former Smoker    Quit date: 01/04/1969  . Smokeless tobacco: Never Used  . Alcohol Use: No  . Drug Use: No  . Sexual Activity:     Partners:  Female       Other Topics Concern  . Not on file   Social History Narrative    No Known Allergies        Current Outpatient Prescriptions  Medication Sig Dispense Refill  . aspirin EC 325 MG tablet Take 1 tablet (325 mg total) by mouth daily. 365 tablet 0  . atorvastatin (LIPITOR) 40 MG tablet Take 1 tablet (40 mg total) by mouth daily. F/u labs in March 2017 90 tablet 1  . brinzolamide (AZOPT) 1 % ophthalmic suspension Place 1 drop into both eyes 2 (two) times daily. 10 mL 1  . lisinopril-hydrochlorothiazide (PRINZIDE,ZESTORETIC) 20-25 MG tablet Take 1 tablet by mouth daily. 90 tablet 3  . metoprolol succinate (TOPROL-XL) 100 MG 24 hr tablet Take 1 tablet (100 mg total) by mouth daily. Take with or immediately following a meal. 30 tablet 3  . PHENobarbital (LUMINAL) 32.4 MG tablet Take 2 tablets (64.8 mg total) by mouth at bedtime. 180 tablet 1  . zolpidem (AMBIEN) 10 MG tablet Take 1 tablet (  10 mg total) by mouth at bedtime as needed. for sleep 30 tablet 2  . traMADol (ULTRAM) 50 MG tablet Take 1 tablet (50 mg total) by mouth every 8 (eight) hours as needed. (Patient not taking: Reported on 03/12/2016) 30 tablet 0  . triamcinolone cream (KENALOG) 0.1 % Apply to affected areas twice a day for up to two weeks, avoid face. (Patient not taking: Reported on 03/12/2016) 80 g 0   No current facility-administered medications for this visit.    REVIEW OF SYSTEMS:  [X]  denotes positive finding, [ ]  denotes negative finding Cardiac  Comments:  Chest pain or chest pressure:    Shortness of breath upon exertion:    Short of breath when lying flat:    Irregular heart rhythm:        Vascular    Pain in calf, thigh, or hip brought on by ambulation:    Pain in feet at night that wakes you up from your sleep:     Blood clot in your veins:    Leg swelling:         Pulmonary    Oxygen at home:    Productive cough:     Wheezing:         Neurologic     Sudden weakness in arms or legs:     Sudden numbness in arms or legs:     Sudden onset of difficulty speaking or slurred speech:    Temporary loss of vision in one eye:     Problems with dizziness:         Gastrointestinal    Blood in stool:     Vomited blood:         Genitourinary    Burning when urinating:     Blood in urine:        Psychiatric    Major depression:         Hematologic    Bleeding problems:    Problems with blood clotting too easily:        Skin    Rashes or ulcers:        Constitutional    Fever or chills:      PHYSICAL EXAM:      Filed Vitals:   03/12/16 1157 03/12/16 1158 03/12/16 1203  BP: 180/86 190/88 177/80  Pulse: 58    Temp: 97.1 F (36.2 C)    TempSrc: Oral    Resp: 20    Height: 6' (1.829 m)    Weight: 189 lb 8 oz (85.957 kg)      GENERAL: The patient is a well-nourished male, in no acute distress. The vital signs are documented above. CARDIAC: There is a regular rate and rhythm.  VASCULAR: No carotid bruits PULMONARY: There is good air exchange bilaterally without wheezing or rales. ABDOMEN: Soft and non-tender with normal pitched bowel sounds.  MUSCULOSKELETAL: There are no major deformities or cyanosis. NEUROLOGIC: No focal weakness or paresthesias are detected. SKIN: There are no ulcers or rashes noted. PSYCHIATRIC: The patient has a normal affect.  DATA:  Carotid Doppler study was ordered that our office today.  This shows 60-79% right carotid stenosis and 80-99 percent left carotid stenosis with peak end-diastolic velocities of Q000111Q cm/sec.  Bifurcation is mid hyoid  MEDICAL ISSUES: Asymptomatic bilateral carotid stenosis, left greater than right: I discussed with the patient proceeding with left carotid endarterectomy for stroke prevention.  I told him that this may possibly help with his dizziness.  I was very  clear that this certainly is not  guaranteed.  We discussed the risks and benefits of the operation which include but are not limited to the risk of stroke, nerve injury, numbness around the cheek and side of the incision.  All his questions were answered.  He does not have a cardiologist locally, so I will get him established with cardiology and get a formal clearance.  I have scheduled his operation for Wednesday, August 2   Annamarie Major, MD [facility] [phone] Pager [phone]    No interval changes, denies neuro sx's CV:RRR  PULM:CTA abd soft Plan Left CEA.  All questions answered.  Annamarie Major

## 2016-04-19 ENCOUNTER — Telehealth: Payer: Self-pay | Admitting: Surgery

## 2016-04-19 ENCOUNTER — Encounter (HOSPITAL_COMMUNITY): Payer: Self-pay | Admitting: Surgery

## 2016-04-19 LAB — BASIC METABOLIC PANEL
ANION GAP: 7 (ref 5–15)
BUN: 20 mg/dL (ref 6–20)
CHLORIDE: 109 mmol/L (ref 101–111)
CO2: 26 mmol/L (ref 22–32)
Calcium: 8.4 mg/dL — ABNORMAL LOW (ref 8.9–10.3)
Creatinine, Ser: 1.37 mg/dL — ABNORMAL HIGH (ref 0.61–1.24)
GFR calc Af Amer: 52 mL/min — ABNORMAL LOW (ref >60)
GFR, EST NON AFRICAN AMERICAN: 45 mL/min — AB (ref >60)
Glucose, Bld: 105 mg/dL — ABNORMAL HIGH (ref 65–99)
POTASSIUM: 4 mmol/L (ref 3.5–5.1)
SODIUM: 142 mmol/L (ref 135–145)

## 2016-04-19 LAB — CBC
HCT: 36.5 % — ABNORMAL LOW (ref 39.0–52.0)
HEMOGLOBIN: 11.6 g/dL — AB (ref 13.0–17.0)
MCH: 30.9 pg (ref 26.0–34.0)
MCHC: 31.8 g/dL (ref 30.0–36.0)
MCV: 97.3 fL (ref 78.0–100.0)
PLATELETS: 183 10*3/uL (ref 150–400)
RBC: 3.75 MIL/uL — AB (ref 4.22–5.81)
RDW: 13.3 % (ref 11.5–15.5)
WBC: 9.7 10*3/uL (ref 4.0–10.5)

## 2016-04-19 MED ORDER — OXYCODONE-ACETAMINOPHEN 5-325 MG PO TABS: 1.0000 | ORAL_TABLET | Freq: Four times a day (QID) | ORAL | 0 refills | Status: DC | PRN

## 2016-04-19 NOTE — Telephone Encounter (Addendum)
-----   Message from Mena Goes, RN sent at 04/19/2016 10:43 AM EDT ----- Regarding: 2 weeks postop   ----- Message ----- From: Gabriel Earing, PA-C Sent: 04/19/2016   7:18 AM To: Vvs Charge Pool  S/p left CEA 04/19/16.  F/u with Dr. Trula Slade in 2 weeks.  Appt scheduled on 05/07/16 at 3:45pm w/ [facility]. Mailed letter and also called patient and left message for pt [id number]

## 2016-04-19 NOTE — Progress Notes (Signed)
  Progress Note    04/19/2016 7:13 AM 1 Day Post-Op  Subjective:  C/o sore throat, but no trouble swallowing  afebrile HR  60's-80's NSR [id number] systolic [id number] RA  Vitals:   04/19/16 0240 04/19/16 0243  BP: (!) 165/86   Pulse: 85   Resp: (!) 24   Temp:  98 F (36.7 C)     Physical Exam: Neuro:  In tact; tongue is midline; no issues swallowing. Lungs:  Non labored Incision:  Some fullness around the incision, but clean and dry   CBC    Component Value Date/Time   WBC 9.7 04/19/2016 0352   RBC 3.75 (L) 04/19/2016 0352   HGB 11.6 (L) 04/19/2016 0352   HCT 36.5 (L) 04/19/2016 0352   PLT 183 04/19/2016 0352   MCV 97.3 04/19/2016 0352   MCH 30.9 04/19/2016 0352   MCHC 31.8 04/19/2016 0352   RDW 13.3 04/19/2016 0352    BMET    Component Value Date/Time   NA 142 04/19/2016 0352   K 4.0 04/19/2016 0352   CL 109 04/19/2016 0352   CO2 26 04/19/2016 0352   GLUCOSE 105 (H) 04/19/2016 0352   BUN 20 04/19/2016 0352   CREATININE 1.37 (H) 04/19/2016 0352   CREATININE 1.25 09/14/2014 0851   CALCIUM 8.4 (L) 04/19/2016 0352   GFRNONAA 45 (L) 04/19/2016 0352   GFRNONAA 52 (L) 09/14/2014 0851   GFRAA 52 (L) 04/19/2016 0352   GFRAA 60 09/14/2014 0851     Intake/Output Summary (Last 24 hours) at 04/19/16 0713 Last data filed at 04/19/16 0600  Gross per 24 hour  Intake          2486.25 ml  Output             1225 ml  Net          1261.25 ml     Assessment/Plan:  This is a 82 y.o. male who is s/p left CEA 1 Day Post-Op  -pt is doing well this am. -pt neuro exam is in tact -pt has ambulated -pt has voided -acute surgical blood loss anemia-tolerating -creatinine is improved from pre op. -f/u with Dr. Trula Slade in 2 weeks.   Leontine Locket, PA-C [facility] [phone]  Tolerated breakfast Ambulating Clear for d/c    Annamarie Major

## 2016-04-19 NOTE — Progress Notes (Signed)
Received order to discharge patient. PIV's removed per order. Provided education to pt and son, answered all questions.

## 2016-04-20 ENCOUNTER — Observation Stay (HOSPITAL_COMMUNITY)
Admission: EM | Admit: 2016-04-20 | Discharge: 2016-04-21 | Disposition: A | Discharge status: Home / Self Care | Payer: Medicare HMO | Attending: Surgery | Admitting: Surgery

## 2016-04-20 ENCOUNTER — Encounter (HOSPITAL_COMMUNITY): Payer: Self-pay | Admitting: *Deleted

## 2016-04-20 DIAGNOSIS — R1310 Dysphagia, unspecified: Principal | ICD-10-CM | POA: Insufficient documentation

## 2016-04-20 DIAGNOSIS — I6529 Occlusion and stenosis of unspecified carotid artery: Secondary | ICD-10-CM | POA: Diagnosis present

## 2016-04-20 DIAGNOSIS — Z9889 Other specified postprocedural states: Secondary | ICD-10-CM

## 2016-04-20 DIAGNOSIS — H409 Unspecified glaucoma: Secondary | ICD-10-CM | POA: Diagnosis not present

## 2016-04-20 DIAGNOSIS — Z87891 Personal history of nicotine dependence: Secondary | ICD-10-CM | POA: Diagnosis not present

## 2016-04-20 DIAGNOSIS — Z7982 Long term (current) use of aspirin: Secondary | ICD-10-CM | POA: Insufficient documentation

## 2016-04-20 DIAGNOSIS — R51 Headache: Secondary | ICD-10-CM | POA: Insufficient documentation

## 2016-04-20 DIAGNOSIS — Z85828 Personal history of other malignant neoplasm of skin: Secondary | ICD-10-CM | POA: Insufficient documentation

## 2016-04-20 DIAGNOSIS — E785 Hyperlipidemia, unspecified: Secondary | ICD-10-CM | POA: Insufficient documentation

## 2016-04-20 DIAGNOSIS — I10 Essential (primary) hypertension: Secondary | ICD-10-CM | POA: Diagnosis not present

## 2016-04-20 DIAGNOSIS — M542 Cervicalgia: Secondary | ICD-10-CM

## 2016-04-20 DIAGNOSIS — I2510 Atherosclerotic heart disease of native coronary artery without angina pectoris: Secondary | ICD-10-CM | POA: Diagnosis not present

## 2016-04-20 LAB — COMPREHENSIVE METABOLIC PANEL
ALBUMIN: 3.8 g/dL (ref 3.5–5.0)
ALK PHOS: 80 U/L (ref 38–126)
ALK PHOS: 74 U/L (ref 38–126)
ALT: 18 U/L (ref 17–63)
ALT: 17 U/L (ref 17–63)
AST: 29 U/L (ref 15–41)
AST: 29 U/L (ref 15–41)
Albumin: 3.6 g/dL (ref 3.5–5.0)
Anion gap: 8 (ref 5–15)
Anion gap: 10 (ref 5–15)
BILIRUBIN TOTAL: 0.8 mg/dL (ref 0.3–1.2)
BUN: 20 mg/dL (ref 6–20)
BUN: 16 mg/dL (ref 6–20)
CALCIUM: 9.1 mg/dL (ref 8.9–10.3)
CALCIUM: 8.9 mg/dL (ref 8.9–10.3)
CO2: 25 mmol/L (ref 22–32)
CO2: 24 mmol/L (ref 22–32)
CREATININE: 1.18 mg/dL (ref 0.61–1.24)
Chloride: 107 mmol/L (ref 101–111)
Chloride: 109 mmol/L (ref 101–111)
Creatinine, Ser: 1.44 mg/dL — ABNORMAL HIGH (ref 0.61–1.24)
GFR calc Af Amer: 49 mL/min — ABNORMAL LOW (ref >60)
GFR calc non Af Amer: 42 mL/min — ABNORMAL LOW (ref >60)
GFR, EST NON AFRICAN AMERICAN: 54 mL/min — AB (ref >60)
GLUCOSE: 117 mg/dL — AB (ref 65–99)
Glucose, Bld: 102 mg/dL — ABNORMAL HIGH (ref 65–99)
Potassium: 3.7 mmol/L (ref 3.5–5.1)
Potassium: 3.5 mmol/L (ref 3.5–5.1)
SODIUM: 140 mmol/L (ref 135–145)
SODIUM: 143 mmol/L (ref 135–145)
TOTAL PROTEIN: 6.8 g/dL (ref 6.5–8.1)
Total Bilirubin: 0.7 mg/dL (ref 0.3–1.2)
Total Protein: 6.2 g/dL — ABNORMAL LOW (ref 6.5–8.1)

## 2016-04-20 LAB — URINALYSIS, ROUTINE W REFLEX MICROSCOPIC
BILIRUBIN URINE: NEGATIVE
Glucose, UA: NEGATIVE mg/dL
HGB URINE DIPSTICK: NEGATIVE
Ketones, ur: NEGATIVE mg/dL
Leukocytes, UA: NEGATIVE
NITRITE: NEGATIVE
PH: 5.5 (ref 5.0–8.0)
Protein, ur: NEGATIVE mg/dL
SPECIFIC GRAVITY, URINE: 1.016 (ref 1.005–1.030)

## 2016-04-20 LAB — PROTIME-INR
INR: 1.21
INR: 1.25
Prothrombin Time: 15.4 seconds — ABNORMAL HIGH (ref 11.4–15.2)
Prothrombin Time: 15.8 seconds — ABNORMAL HIGH (ref 11.4–15.2)

## 2016-04-20 LAB — CBC
HCT: 37.9 % — ABNORMAL LOW (ref 39.0–52.0)
Hemoglobin: 12.3 g/dL — ABNORMAL LOW (ref 13.0–17.0)
MCH: 31.1 pg (ref 26.0–34.0)
MCHC: 32.5 g/dL (ref 30.0–36.0)
MCV: 95.9 fL (ref 78.0–100.0)
Platelets: 195 K/uL (ref 150–400)
RBC: 3.95 MIL/uL — ABNORMAL LOW (ref 4.22–5.81)
RDW: 13.1 % (ref 11.5–15.5)
WBC: 10.6 K/uL — ABNORMAL HIGH (ref 4.0–10.5)

## 2016-04-20 LAB — I-STAT CHEM 8, ED
BUN: 21 mg/dL — AB (ref 6–20)
Calcium, Ion: 1.15 mmol/L (ref 1.12–1.23)
Chloride: 106 mmol/L (ref 101–111)
Creatinine, Ser: 1.4 mg/dL — ABNORMAL HIGH (ref 0.61–1.24)
Glucose, Bld: 121 mg/dL — ABNORMAL HIGH (ref 65–99)
HEMATOCRIT: 37 % — AB (ref 39.0–52.0)
HEMOGLOBIN: 12.6 g/dL — AB (ref 13.0–17.0)
Potassium: 3.8 mmol/L (ref 3.5–5.1)
SODIUM: 143 mmol/L (ref 135–145)
TCO2: 25 mmol/L (ref 0–100)

## 2016-04-20 LAB — CBC WITH DIFFERENTIAL/PLATELET
BASOS ABS: 0 10*3/uL (ref 0.0–0.1)
BASOS PCT: 0 %
EOS ABS: 0.3 10*3/uL (ref 0.0–0.7)
Eosinophils Relative: 4 %
HEMATOCRIT: 38.9 % — AB (ref 39.0–52.0)
HEMOGLOBIN: 12.7 g/dL — AB (ref 13.0–17.0)
Lymphocytes Relative: 17 %
Lymphs Abs: 1.5 10*3/uL (ref 0.7–4.0)
MCH: 31.6 pg (ref 26.0–34.0)
MCHC: 32.6 g/dL (ref 30.0–36.0)
MCV: 96.8 fL (ref 78.0–100.0)
Monocytes Absolute: 0.8 10*3/uL (ref 0.1–1.0)
Monocytes Relative: 9 %
NEUTROS ABS: 6.4 10*3/uL (ref 1.7–7.7)
NEUTROS PCT: 70 %
Platelets: 200 10*3/uL (ref 150–400)
RBC: 4.02 MIL/uL — AB (ref 4.22–5.81)
RDW: 13.1 % (ref 11.5–15.5)
WBC: 9.1 10*3/uL (ref 4.0–10.5)

## 2016-04-20 MED ORDER — LISINOPRIL 10 MG PO TABS
10.0000 mg | ORAL_TABLET | Freq: Every day | ORAL | Status: DC
Start: 2016-04-20 — End: 2016-04-21
  Administered 2016-04-20 – 2016-04-21 (×2): 10 mg via ORAL
  Filled 2016-04-20 (×2): qty 1

## 2016-04-20 MED ORDER — BRINZOLAMIDE 1 % OP SUSP
1.0000 [drp] | Freq: Two times a day (BID) | OPHTHALMIC | Status: DC
  Administered 2016-04-20 – 2016-04-21 (×2): 1 [drp] via OPHTHALMIC
  Filled 2016-04-20: qty 10

## 2016-04-20 MED ORDER — OXYCODONE-ACETAMINOPHEN 5-325 MG PO TABS
1.0000 | ORAL_TABLET | Freq: Four times a day (QID) | ORAL | Status: DC | PRN
  Administered 2016-04-21 (×2): 1 via ORAL
  Filled 2016-04-20 (×2): qty 1

## 2016-04-20 MED ORDER — ZOLPIDEM TARTRATE 5 MG PO TABS: 10.0000 mg | ORAL_TABLET | Freq: Every evening | ORAL | Status: DC | PRN

## 2016-04-20 MED ORDER — ASPIRIN EC 325 MG PO TBEC
325.0000 mg | DELAYED_RELEASE_TABLET | Freq: Every day | ORAL | Status: DC
  Administered 2016-04-20 – 2016-04-21 (×2): 325 mg via ORAL
  Filled 2016-04-20 (×2): qty 1

## 2016-04-20 MED ORDER — HYDRALAZINE HCL 20 MG/ML IJ SOLN: 10.0000 mg | INTRAMUSCULAR | Status: DC | PRN

## 2016-04-20 MED ORDER — LISINOPRIL-HYDROCHLOROTHIAZIDE 20-25 MG PO TABS: 0.5000 | ORAL_TABLET | Freq: Every day | ORAL | Status: DC

## 2016-04-20 MED ORDER — METOPROLOL SUCCINATE ER 100 MG PO TB24
100.0000 mg | ORAL_TABLET | Freq: Every day | ORAL | Status: DC
  Administered 2016-04-20 – 2016-04-21 (×2): 100 mg via ORAL
  Filled 2016-04-20: qty 1
  Filled 2016-04-20: qty 4

## 2016-04-20 MED ORDER — ATORVASTATIN CALCIUM 80 MG PO TABS: 80.0000 mg | ORAL_TABLET | Freq: Every day | ORAL | Status: DC

## 2016-04-20 MED ORDER — MORPHINE SULFATE (PF) 2 MG/ML IV SOLN
2.0000 mg | INTRAVENOUS | Status: DC | PRN
  Administered 2016-04-20 (×3): 2 mg via INTRAVENOUS
  Filled 2016-04-20 (×3): qty 1

## 2016-04-20 MED ORDER — PHENOBARBITAL 32.4 MG PO TABS
64.8000 mg | ORAL_TABLET | Freq: Every day | ORAL | Status: DC
  Administered 2016-04-20: 64.8 mg via ORAL
  Filled 2016-04-20: qty 2

## 2016-04-20 MED ORDER — ONDANSETRON HCL 4 MG/2ML IJ SOLN
4.0000 mg | Freq: Four times a day (QID) | INTRAMUSCULAR | Status: DC | PRN
  Administered 2016-04-20: 4 mg via INTRAVENOUS
  Filled 2016-04-20: qty 2

## 2016-04-20 MED ORDER — FERROUS SULFATE 325 (65 FE) MG PO TABS
325.0000 mg | ORAL_TABLET | Freq: Every day | ORAL | Status: DC
  Administered 2016-04-21: 325 mg via ORAL
  Filled 2016-04-20 (×2): qty 1

## 2016-04-20 MED ORDER — ZOLPIDEM TARTRATE 5 MG PO TABS
5.0000 mg | ORAL_TABLET | Freq: Every evening | ORAL | Status: DC | PRN
  Administered 2016-04-20: 5 mg via ORAL
  Filled 2016-04-20: qty 1

## 2016-04-20 MED ORDER — ADULT MULTIVITAMIN W/MINERALS CH
1.0000 | ORAL_TABLET | Freq: Every day | ORAL | Status: DC
  Administered 2016-04-21: 1 via ORAL
  Filled 2016-04-20 (×2): qty 1

## 2016-04-20 MED ORDER — HYDRALAZINE HCL 20 MG/ML IJ SOLN
5.0000 mg | INTRAMUSCULAR | Status: AC | PRN
  Administered 2016-04-20 (×2): 5 mg via INTRAVENOUS
  Filled 2016-04-20 (×2): qty 1

## 2016-04-20 MED ORDER — HYDROCHLOROTHIAZIDE 12.5 MG PO CAPS
12.5000 mg | ORAL_CAPSULE | Freq: Every day | ORAL | Status: DC
  Administered 2016-04-20 – 2016-04-21 (×2): 12.5 mg via ORAL
  Filled 2016-04-20 (×2): qty 1

## 2016-04-20 MED ORDER — HYDRALAZINE HCL 20 MG/ML IJ SOLN
10.0000 mg | Freq: Once | INTRAMUSCULAR | Status: AC
  Administered 2016-04-20: 10 mg via INTRAVENOUS
  Filled 2016-04-20: qty 1

## 2016-04-20 MED ORDER — ATORVASTATIN CALCIUM 80 MG PO TABS
80.0000 mg | ORAL_TABLET | Freq: Every day | ORAL | Status: DC
  Filled 2016-04-20: qty 1

## 2016-04-20 MED ORDER — SODIUM CHLORIDE 0.9 % IV BOLUS (SEPSIS)
1000.0000 mL | Freq: Once | INTRAVENOUS | Status: AC
  Administered 2016-04-20: 1000 mL via INTRAVENOUS

## 2016-04-20 MED ORDER — ASPIRIN EC 325 MG PO TBEC: 325.0000 mg | DELAYED_RELEASE_TABLET | ORAL | Status: DC

## 2016-04-20 MED ORDER — METOPROLOL TARTRATE 5 MG/5ML IV SOLN: 5.0000 mg | Freq: Four times a day (QID) | INTRAVENOUS | Status: DC | PRN

## 2016-04-20 NOTE — ED Provider Notes (Signed)
[facility] DEPT Provider Note   CSN: [id number] Arrival date & time: 04/20/16  0701  First Provider Contact:  First MD Initiated Contact with Patient 04/20/16 [phone]        History   Chief Complaint Chief Complaint  Patient presents with  . Neck Pain    HPI Brad James is a 82 y.o. male.  HPI 82 year old male with past medical history of hypertension, hyperlipidemia, coronary artery disease and carotid artery stenosis status post carotid endarterectomy on 04/18/2016. He presents with worsening neck pain and swelling. The patient states that since his surgery he has had persistent left neck pain and swelling. He was just discharged from the hospital yesterday upon returning home, he feels that his neck has become increasingly swollen and he has developed difficulty swallowing. Denies any associated shortness of breath or difficulty breathing. The pain is made worse with any palpation or movement. He denies any fevers. Denies any redness. Denies any drainage from his operative site. No other medical complaints  Past Medical History:  Diagnosis Date  . CAD (coronary artery disease) 01/04/2014  . Carotid artery stenosis 01/04/2014  . Dizzy spells    At times per pt  . Essential hypertension, benign 01/04/2014  . Glaucoma    Bilateral  . Hyperlipidemia 01/04/2014  . Hypertension   . Insomnia 01/04/2014    Patient Active Problem List   Diagnosis Date Noted  . Primary open angle glaucoma 03/19/2016  . Carotid stenosis 03/12/2016  . Pulmonary nodules 06/08/2015  . Gout 04/18/2015  . Skin cancer 07/22/2014  . Nodular basal cell carcinoma 06/10/2014  . Squamous cell carcinoma in situ 06/10/2014  . Renal artery stenosis ([place]) 02/22/2014  . Chronic pancreatitis ([place]) 02/22/2014  . 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 Primary open angle glaucoma of both eyes 02/22/2014  . History of basal cell carcinoma 02/22/2014  . CAD (coronary artery disease) 01/04/2014  . Essential hypertension,  benign 01/04/2014  . Hyperlipidemia 01/04/2014  . Insomnia 01/04/2014  . Carotid artery stenosis 01/04/2014  . Epilepsy ([place]) 01/04/2014    Past Surgical History:  Procedure Laterality Date  . COLONOSCOPY    . CORONARY ANGIOPLASTY WITH STENT PLACEMENT     stated 3-4 over past 10 yrs.   [name] ENDARTERECTOMY Left 04/18/2016   Procedure: LEFT CAROTID ENDARTERECTOMY;  Surgeon: Serafina Mitchell, MD;  Location: [facility];  Service: Vascular;  Laterality: Left;  [name] MELANOMA EXCISION    . PATCH ANGIOPLASTY Left 04/18/2016   Procedure: PATCH ANGIOPLASTY USING Rueben Bash BIOLOGIC PATCH;  Surgeon: Serafina Mitchell, MD;  Location: [facility] OR;  Service: Vascular;  Laterality: Left;       Home Medications    Prior to Admission medications   Medication Sig Start Date End Date Taking? Authorizing Provider  aspirin EC 325 MG tablet Take 1 tablet (325 mg total) by mouth daily. Patient taking differently: Take 325 mg by mouth every morning.  02/06/16  Yes Sean Hommel, DO  atorvastatin (LIPITOR) 80 MG tablet Take 1 tablet (80 mg total) by mouth daily. F/u labs in March 2017 03/23/16  Yes Thayer Headings, MD  brinzolamide (AZOPT) 1 % ophthalmic suspension Place 1 drop into both eyes 2 (two) times daily. 11/10/14  Yes Sean Hommel, DO  ferrous sulfate 325 (65 FE) MG EC tablet Take 325 mg by mouth daily with breakfast.   Yes Historical Provider, MD  lisinopril-hydrochlorothiazide (PRINZIDE,ZESTORETIC) 20-25 MG tablet Take 0.5 tablets by mouth daily. 03/23/16  Yes Thayer Headings, MD  metoprolol  succinate (TOPROL-XL) 100 MG 24 hr tablet Take 1 tablet (100 mg total) by mouth daily. Take with or immediately following a meal. 02/22/16  Yes Sean Hommel, DO  Multiple Vitamin (MULTIVITAMIN) tablet Take 1 tablet by mouth daily.   Yes Historical Provider, MD  oxyCODONE-acetaminophen (PERCOCET/ROXICET) 5-325 MG tablet Take 1 tablet by mouth every 6 (six) hours as needed for moderate pain. 04/19/16  Yes Ulyses Amor, PA-C  PHENobarbital  (LUMINAL) 32.4 MG tablet Take 2 tablets (64.8 mg total) by mouth at bedtime. 11/08/15  Yes Sean Hommel, DO  Probiotic Product (PROBIOTIC PO) Take 1 tablet by mouth daily.   Yes Historical Provider, MD  zolpidem (AMBIEN) 10 MG tablet TAKE ONE TABLET BY MOUTH AT BEDTIME AS NEEDED FOR SLEEP 04/16/16  Yes Marcial Pacas, DO    Family History Family History  Problem Relation Age of Onset  . Heart disease Mother   . Cancer Father 75    oral  . Stroke Brother 21    Social History Social History  Substance Use Topics  . Smoking status: Former Smoker    Quit date: 01/04/1969  . Smokeless tobacco: Never Used  . Alcohol use No     Allergies   Review of patient's allergies indicates no known allergies.   Review of Systems Review of Systems  Constitutional: Negative for chills, fatigue and fever.  HENT: Positive for trouble swallowing. Negative for congestion, rhinorrhea and voice change.   Eyes: Negative for visual disturbance.  Respiratory: Negative for cough, shortness of breath and wheezing.   Cardiovascular: Negative for chest pain and leg swelling.  Gastrointestinal: Negative for abdominal pain, diarrhea, nausea and vomiting.  Genitourinary: Negative for dysuria and flank pain.  Musculoskeletal: Positive for neck pain and neck stiffness.  Skin: Negative for rash and wound.  Allergic/Immunologic: Negative for immunocompromised state.  Neurological: Negative for syncope, weakness and headaches.     Physical Exam Updated Vital Signs BP (!) 171/81   Pulse (!) 106   Temp 97.7 F (36.5 C) (Oral)   Resp 20   Ht 6' (1.829 m)   Wt 190 lb 1.6 oz (86.2 kg)   SpO2 93%   BMI 25.78 kg/m   Physical Exam  Constitutional: He is oriented to person, place, and time. He appears well-developed and well-nourished. No distress.  HENT:  Head: Normocephalic and atraumatic.  Mouth/Throat: Oropharynx is clear and moist.  Eyes: Conjunctivae are normal. Pupils are equal, round, and reactive to  light.  No pooling of secretions.  Neck:  Left operative site c/d/i. He has moderate surrounding swelling. No pulsatility. No active bleeding. Neck otherwise supple with no stridor. No bruits.   Cardiovascular: Normal rate, regular rhythm and normal heart sounds.  Exam reveals no friction rub.   No murmur heard. Pulmonary/Chest: Effort normal and breath sounds normal. No respiratory distress. He has no wheezes. He has no rales.  Abdominal: He exhibits no distension. There is no tenderness.  Musculoskeletal: He exhibits no edema.  Neurological: He is alert and oriented to person, place, and time. He exhibits normal muscle tone.  Skin: Skin is warm. Capillary refill takes less than 2 seconds.  Nursing note and vitals reviewed.    ED Treatments / Results  Labs (all labs ordered are listed, but only abnormal results are displayed) Labs Reviewed  CBC WITH DIFFERENTIAL/PLATELET - Abnormal; Notable for the following:       Result Value   RBC 4.02 (*)    Hemoglobin 12.7 (*)    HCT  38.9 (*)    All other components within normal limits  COMPREHENSIVE METABOLIC PANEL - Abnormal; Notable for the following:    Glucose, Bld 117 (*)    Creatinine, Ser 1.44 (*)    GFR calc non Af Amer 42 (*)    GFR calc Af Amer 49 (*)    All other components within normal limits  PROTIME-INR - Abnormal; Notable for the following:    Prothrombin Time 15.4 (*)    All other components within normal limits  CBC - Abnormal; Notable for the following:    WBC 10.6 (*)    RBC 3.95 (*)    Hemoglobin 12.3 (*)    HCT 37.9 (*)    All other components within normal limits  COMPREHENSIVE METABOLIC PANEL - Abnormal; Notable for the following:    Glucose, Bld 102 (*)    Total Protein 6.2 (*)    GFR calc non Af Amer 54 (*)    All other components within normal limits  PROTIME-INR - Abnormal; Notable for the following:    Prothrombin Time 15.8 (*)    All other components within normal limits  I-STAT CHEM 8, ED -  Abnormal; Notable for the following:    BUN 21 (*)    Creatinine, Ser 1.40 (*)    Glucose, Bld 121 (*)    Hemoglobin 12.6 (*)    HCT 37.0 (*)    All other components within normal limits  URINALYSIS, ROUTINE W REFLEX MICROSCOPIC (NOT AT [facility])  URINALYSIS, ROUTINE W REFLEX MICROSCOPIC (NOT AT [facility])    EKG  EKG Interpretation None       Radiology No results found.  Procedures Procedures (including critical care time)  Medications Ordered in ED Medications  brinzolamide (AZOPT) 1 % ophthalmic suspension 1 drop (1 drop Both Eyes Given 04/20/16 1807)  ferrous sulfate tablet 325 mg (325 mg Oral Not Given 04/20/16 0845)  metoprolol succinate (TOPROL-XL) 24 hr tablet 100 mg (100 mg Oral Given 04/20/16 1126)  multivitamin with minerals tablet 1 tablet (1 tablet Oral Not Given 04/20/16 1000)  oxyCODONE-acetaminophen (PERCOCET/ROXICET) 5-325 MG per tablet 1 tablet (not administered)  PHENobarbital (LUMINAL) tablet 64.8 mg (not administered)  morphine 2 MG/ML injection 2-3 mg (2 mg Intravenous Given 04/20/16 1557)  hydrALAZINE (APRESOLINE) injection 10 mg (not administered)  metoprolol (LOPRESSOR) injection 5 mg (not administered)  ondansetron (ZOFRAN) injection 4 mg (4 mg Intravenous Given 04/20/16 1557)  aspirin EC tablet 325 mg (325 mg Oral Given 04/20/16 1806)  atorvastatin (LIPITOR) tablet 80 mg (80 mg Oral Not Given 04/20/16 1806)  lisinopril (PRINIVIL,ZESTRIL) tablet 10 mg (10 mg Oral Given 04/20/16 1806)    And  hydrochlorothiazide (MICROZIDE) capsule 12.5 mg (12.5 mg Oral Given 04/20/16 1806)  zolpidem (AMBIEN) tablet 5 mg (not administered)  sodium chloride 0.9 % bolus 1,000 mL (0 mLs Intravenous Stopped 04/20/16 0923)  hydrALAZINE (APRESOLINE) injection 5 mg (5 mg Intravenous Given 04/20/16 1411)  hydrALAZINE (APRESOLINE) injection 10 mg (10 mg Intravenous Given 04/20/16 1500)     Initial Impression / Assessment and Plan / ED Course  I have reviewed the triage vital signs and the nursing  notes.  Pertinent labs & imaging results that were available during my care of the patient were reviewed by me and considered in my medical decision making (see chart for details).  Clinical Course   82 year old male with past medical history of recent left-sided carotid endarterectomy who presents with worsening left neck pain and swelling. Exam as above. He has  moderate swelling at his operative site, but sutures are intact. He has subjective difficulty swallowing, but there is no pooling of secretions or signs of inability to tolerate by mouth. He has no stridor or signs of airway compromise. I discussed the case with his vascular surgeon, Dr. Trula Slade, Who has evaluated. He will admit for observation and further workup. At this time, airway is intact and stable. No further imaging required at this time.  Final Clinical Impressions(s) / ED Diagnoses   Final diagnoses:  Neck pain  S/P carotid endarterectomy    New Prescriptions Current Discharge Medication List       Duffy Bruce, MD 04/20/16 1851

## 2016-04-20 NOTE — ED Notes (Signed)
Pt here for significant swelling to neck area. sts was discharged yesterday form the hospital after carotid surgery. sts he is swallowing a lot of clear fluid about every few seconds. sts he was unable to take meds this am due to swallowing difficulty.

## 2016-04-20 NOTE — Progress Notes (Addendum)
Patient given some chicken broth while sitting up in the bed, patient stated that his swallowing was better, scheduled meds also given , no signs of distress noticed will continue to monitor

## 2016-04-20 NOTE — H&P (Signed)
Patient name: Brad James MRN: [id number] DOB: [birth year] Sex: male    HPI: Brad James is a 82 y.o. male  Who is s/p L CEA POD #2.  H had difficulty swallowing overnight, along with a headache.  He felt that his neck was swollen.  He had mild difficulty breathing.  He states he feels much better today.  He denies neurologic symptoms.  Current Facility-Administered Medications  Medication Dose Route Frequency Provider Last Rate Last Dose  . aspirin EC tablet 325 mg  325 mg Oral [id number] Samantha J Rhyne, PA-C      . atorvastatin (LIPITOR) tablet 80 mg  80 mg Oral Daily Samantha J Rhyne, PA-C      . brinzolamide (AZOPT) 1 % ophthalmic suspension 1 drop  1 drop Both Eyes BID Samantha J Rhyne, PA-C      . ferrous sulfate EC tablet 325 mg  325 mg Oral Q breakfast Samantha J Rhyne, PA-C      . lisinopril-hydrochlorothiazide (PRINZIDE,ZESTORETIC) 20-25 MG per tablet 0.5 tablet  0.5 tablet Oral Daily Samantha J Rhyne, PA-C      . metoprolol succinate (TOPROL-XL) 24 hr tablet 100 mg  100 mg Oral Daily Samantha J Rhyne, PA-C      . multivitamin tablet 1 tablet  1 tablet Oral Daily Samantha J Rhyne, PA-C      . oxyCODONE-acetaminophen (PERCOCET/ROXICET) 5-325 MG per tablet 1 tablet  1 tablet Oral Q6H PRN Samantha J Rhyne, PA-C      . PHENobarbital (LUMINAL) tablet 64.8 mg  64.8 mg Oral QHS Samantha J Rhyne, PA-C      . sodium chloride 0.9 % bolus 1,000 mL  1,000 mL Intravenous Once Duffy Bruce, MD 1,000 mL/hr at 04/20/16 0755 1,000 mL at 04/20/16 0755  . zolpidem (AMBIEN) tablet 10 mg  10 mg Oral QHS PRN Gabriel Earing, PA-C       Current Outpatient Prescriptions  Medication Sig Dispense Refill  . aspirin EC 325 MG tablet Take 1 tablet (325 mg total) by mouth daily. (Patient taking differently: Take 325 mg by mouth every morning. ) 365 tablet 0  . atorvastatin (LIPITOR) 80 MG tablet Take 1 tablet (80 mg total) by mouth daily. F/u labs in March 2017 30 tablet  11  . brinzolamide (AZOPT) 1 % ophthalmic suspension Place 1 drop into both eyes 2 (two) times daily. 10 mL 1  . ferrous sulfate 325 (65 FE) MG EC tablet Take 325 mg by mouth daily with breakfast.    . lisinopril-hydrochlorothiazide (PRINZIDE,ZESTORETIC) 20-25 MG tablet Take 0.5 tablets by mouth daily. 90 tablet 3  . metoprolol succinate (TOPROL-XL) 100 MG 24 hr tablet Take 1 tablet (100 mg total) by mouth daily. Take with or immediately following a meal. 30 tablet 3  . Multiple Vitamin (MULTIVITAMIN) tablet Take 1 tablet by mouth daily.    [name] oxyCODONE-acetaminophen (PERCOCET/ROXICET) 5-325 MG tablet Take 1 tablet by mouth every 6 (six) hours as needed for moderate pain. 6 tablet 0  . PHENobarbital (LUMINAL) 32.4 MG tablet Take 2 tablets (64.8 mg total) by mouth at bedtime. 180 tablet 1  . Probiotic Product (PROBIOTIC PO) Take 1 tablet by mouth daily.    [name] zolpidem (AMBIEN) 10 MG tablet TAKE ONE TABLET BY MOUTH AT BEDTIME AS NEEDED FOR SLEEP 30 tablet 0    REVIEW OF SYSTEMS:  [X]  denotes positive finding, [ ]  denotes negative finding Cardiac  Comments:  Chest pain or chest pressure:    Shortness of breath upon  exertion:    Short of breath when lying flat:    Irregular heart rhythm:    Constitutional    Fever or chills:      PHYSICAL EXAM: Vitals:   04/20/16 0706  BP: 176/74  Pulse: 74  Resp: 22  Temp: 97.4 F (36.3 C)  TempSrc: Oral  SpO2: 98%  Weight: 184 lb (83.5 kg)    GENERAL: The patient is a well-nourished male, in no acute distress. The vital signs are documented above. CARDIOVASCULAR: There is a regular rate and rhythm. PULMONARY: There is good air exchange bilaterally without wheezing or rales. Left neck incision soft with some fullness.  No ecchymosis  MEDICAL ISSUES: Suspect mild hematoma s/p L CEA.  He likely has some component of throat irritation from intubation.  I have recommended re-admitting him overnight for observation.  We will monitor his swallowing and  if he continues to have difficulty, I will order a swallow eval.  I do not think he has a hematoma significant to require re-exploration.  Annamarie Major, MD [facility] [phone] Pager [phone]

## 2016-04-20 NOTE — ED Triage Notes (Signed)
Pt in from home c/o L neck pain post Carotid artery sx 04/18/16, pt reports being discharged yesterday, presents now with pain & difficulty swallowing, no drooling noted, A&O x4

## 2016-04-20 NOTE — Progress Notes (Signed)
Patient arrived the unit from the ER, assesment completed see flowsheet, patient oriented to room and staff, bed in lowest position, call light within reach will continue to monitor.

## 2016-04-21 NOTE — Progress Notes (Signed)
Discharged to home with family office visits in place teaching done  

## 2016-04-21 NOTE — Discharge Summary (Signed)
Discharge Summary     Brad James Dec 12, 1926 82 y.o. male  [id number]  Admission Date: 04/20/2016  Discharge Date: 04/21/16  Physician: Serafina Mitchell, MD  Admission Diagnosis: Headache; dysphagia   HPI:   This is a 82 y.o. male Who is s/p L CEA POD #2.  H had difficulty swallowing overnight, along with a headache.  He felt that his neck was swollen.  He had mild difficulty breathing.  He states he feels much better today.  He denies neurologic symptoms.  Hospital Course:  The patient was admitted to the hospital for swallowing issues and headache after being discharged for left carotid endarterectomy on 04/18/16.    He was admitted to the hospital and a swallow study was ordered.  Given his swallowing was much improved, the swallow study was not done and he was discharged home.   He is swallowing liquids a little better and able to take his po medications without difficulty.   The remainder of the hospital course consisted of increasing mobilization and increasing intake of solids without difficulty.    Recent Labs  04/20/16 0833 04/20/16 1710  NA 140 143  K 3.7 3.5  CL 107 109  CO2 25 24  GLUCOSE 117* 102*  BUN 20 16  CALCIUM 9.1 8.9    Recent Labs  04/20/16 0732 04/20/16 0739 04/20/16 1710  WBC 9.1  --  10.6*  HGB 12.7* 12.6* 12.3*  HCT 38.9* 37.0* 37.9*  PLT 200  --  195    Recent Labs  04/20/16 0833 04/20/16 1710  INR 1.21 1.25     Discharge Instructions    CAROTID Sugery: Call MD for difficulty swallowing or speaking; weakness in arms or legs that is a new symtom; severe headache.  If you have increased swelling in the neck and/or  are having difficulty breathing, CALL 911    Complete by:  As directed   Call MD for:  redness, tenderness, or signs of infection (pain, swelling, bleeding, redness, odor or green/yellow discharge around incision site)    Complete by:  As directed   Call MD for:  severe or increased pain, loss or decreased feeling   in affected limb(s)    Complete by:  As directed   Call MD for:  temperature >100.5    Complete by:  As directed   Discharge instructions    Complete by:  As directed   Resume previous discharge instructions.   Resume previous diet    Complete by:  As directed      Discharge Diagnosis:  Headache; dysphagia  Secondary Diagnosis: Patient Active Problem List   Diagnosis Date Noted  . Primary open angle glaucoma 03/19/2016  . Carotid stenosis 03/12/2016  . Pulmonary nodules 06/08/2015  . Gout 04/18/2015  . Skin cancer 07/22/2014  . Nodular basal cell carcinoma 06/10/2014  . Squamous cell carcinoma in situ 06/10/2014  . Renal artery stenosis ([place]) 02/22/2014  . Chronic pancreatitis ([place]) 02/22/2014  . SMA stenosis ([place]) 02/22/2014  . Primary open angle glaucoma of both eyes 02/22/2014  . History of basal cell carcinoma 02/22/2014  . CAD (coronary artery disease) 01/04/2014  . Essential hypertension, benign 01/04/2014  . Hyperlipidemia 01/04/2014  . Insomnia 01/04/2014  . Carotid artery stenosis 01/04/2014  . Epilepsy ([place]) 01/04/2014   Past Medical History:  Diagnosis Date  . CAD (coronary artery disease) 01/04/2014  . Carotid artery stenosis 01/04/2014  . Dizzy spells    At times per pt  . Essential hypertension, benign  01/04/2014  . Glaucoma    Bilateral  . Hyperlipidemia 01/04/2014  . Hypertension   . Insomnia 01/04/2014      Medication List    TAKE these medications   aspirin EC 325 MG tablet Take 1 tablet (325 mg total) by mouth daily. What changed:  when to take this   atorvastatin 80 MG tablet Commonly known as:  LIPITOR Take 1 tablet (80 mg total) by mouth daily. F/u labs in March 2017   brinzolamide 1 % ophthalmic suspension Commonly known as:  AZOPT Place 1 drop into both eyes 2 (two) times daily.   ferrous sulfate 325 (65 FE) MG EC tablet Take 325 mg by mouth daily with breakfast.   lisinopril-hydrochlorothiazide 20-25 MG tablet Commonly known as:   PRINZIDE,ZESTORETIC Take 0.5 tablets by mouth daily.   metoprolol succinate 100 MG 24 hr tablet Commonly known as:  TOPROL-XL Take 1 tablet (100 mg total) by mouth daily. Take with or immediately following a meal.   multivitamin tablet Take 1 tablet by mouth daily.   oxyCODONE-acetaminophen 5-325 MG tablet Commonly known as:  PERCOCET/ROXICET Take 1 tablet by mouth every 6 (six) hours as needed for moderate pain.   PHENobarbital 32.4 MG tablet Commonly known as:  LUMINAL Take 2 tablets (64.8 mg total) by mouth at bedtime.   PROBIOTIC PO Take 1 tablet by mouth daily.   zolpidem 10 MG tablet Commonly known as:  AMBIEN TAKE ONE TABLET BY MOUTH AT BEDTIME AS NEEDED FOR SLEEP       Prescriptions given: none  Instructions: 1.  Follow instructions from previous discharge.   Disposition: home  Patient's condition: is Good  Follow up: 1. Dr. Trula Slade on 05/07/16 @ 1545   Leontine Locket, PA-C [facility] [phone]  --- For [facility] use --- Readmitted to the hospital for swallowing issues and HA.

## 2016-04-21 NOTE — Progress Notes (Signed)
   VASCULAR SURGERY ASSESSMENT & PLAN:  POD 3 s/p Left CEA. He was readmitted yesterday because of swallowing issues and HA.  Swallowing (liquids) is a little better. Swallowing eval pending.   Home today if does well with swallowing evaluation.   HA better.  SUBJECTIVE: Says his swallowing is a little better. No significant HA.   PHYSICAL EXAM: Vitals:   04/20/16 1806 04/20/16 1934 04/21/16 0041 04/21/16 0338  BP: (!) 171/81 (!) 152/78 139/65 (!) 159/71  Pulse:  (!) 105 89 89  Resp:  19 19 19   Temp:  99.2 F (37.3 C) 98.4 F (36.9 C) 98.9 F (37.2 C)  TempSrc:  Oral Oral Oral  SpO2:  94% 95% 95%  Weight:      Height:       Incision looks fine Neuro intact.   LABS: Lab Results  Component Value Date   WBC 10.6 (H) 04/20/2016   HGB 12.3 (L) 04/20/2016   HCT 37.9 (L) 04/20/2016   MCV 95.9 04/20/2016   PLT 195 04/20/2016   Lab Results  Component Value Date   CREATININE 1.18 04/20/2016   Lab Results  Component Value Date   INR 1.25 04/20/2016    Active Problems:   Carotid stenosis   Gae Gallop Beeper: [id number] 04/21/2016

## 2016-04-23 ENCOUNTER — Telehealth: Payer: Self-pay

## 2016-04-23 NOTE — Telephone Encounter (Signed)
Phone call from pt.  Reported he was discharged from hospital on Saturday, 8/5.  Stated "there is still swelling along the left neck incision that looks like a piece of sausage."  Denied any redness or inflammation.  Denied any drainage from the incision.  Denied fever/ chills.  Stated there is severe pain at times.  Reported when awakened, the pain was 9/10.  Now, stated the pain is 6/10.  Reported has been taking Tylenol ES, 2 tabs every 6 hrs., and the medication wears off at about 3 hours.  Stated he doesn't want a narcotic, but questioned what else he could take that would help the pain.

## 2016-04-23 NOTE — Telephone Encounter (Signed)
Discussed pt's c/o pain and swelling of left neck with Dr. Trula Slade.  Advised that the pt. Could alternate Ibuprofen with Tylenol Extra Strength q 6 hrs.  Notified pt. Of Dr. Hazel Sams recommendation.  Verb. Understanding.  Also, stated hasn't had BM x 6 days.  Advised that he didn't eat much more than jello for several days.  Denied any abdominal bloating or nausea/ vomiting.  Reported he has started passing gas.  Advised he can take MOM 1 oz, increase water intake, and maintain mobility of walking at intervals.  Verb. Understanding.

## 2016-05-01 ENCOUNTER — Encounter: Payer: Self-pay | Admitting: Surgery

## 2016-05-07 ENCOUNTER — Ambulatory Visit (INDEPENDENT_AMBULATORY_CARE_PROVIDER_SITE_OTHER): Payer: Medicare HMO | Admitting: Surgery

## 2016-05-07 ENCOUNTER — Encounter: Payer: Self-pay | Admitting: Surgery

## 2016-05-07 VITALS — BP 168/79 | HR 58 | Temp 97.0°F | Resp 16 | Ht 72.0 in | Wt 184.0 lb

## 2016-05-07 DIAGNOSIS — I6523 Occlusion and stenosis of bilateral carotid arteries: Secondary | ICD-10-CM

## 2016-05-07 NOTE — Progress Notes (Signed)
Patient name: Brad James MRN: [id number] DOB: 1927-09-10 Sex: male  REASON FOR VISIT: post-op  HPI: Brad James is a 82 y.o. male who returns today for his first postoperative visit.  He is status post left carotid endarterectomy on 04/18/2016 for asymptomatic stenosis.  Intraoperative findings included an 85% stenosis.  He was discharged to home on postoperative day 1.  He returned a day later with complaints of swallowing difficulty and headaches as well as swelling around his incision.  He was admitted overnight he passed a swallowing evaluation.  He did not have a significant hematoma and his neck.  He was ultimately discharged.  He comes in today without significant complaints.  He is still having dizziness, which is the symptom which detected his carotid stenosis.  He is neurologically intact  Current Outpatient Prescriptions  Medication Sig Dispense Refill  . aspirin EC 325 MG tablet Take 1 tablet (325 mg total) by mouth daily. (Patient taking differently: Take 325 mg by mouth every morning. ) 365 tablet 0  . atorvastatin (LIPITOR) 80 MG tablet Take 1 tablet (80 mg total) by mouth daily. F/u labs in March 2017 30 tablet 11  . brinzolamide (AZOPT) 1 % ophthalmic suspension Place 1 drop into both eyes 2 (two) times daily. 10 mL 1  . ferrous sulfate 325 (65 FE) MG EC tablet Take 325 mg by mouth daily with breakfast.    . lisinopril-hydrochlorothiazide (PRINZIDE,ZESTORETIC) 20-25 MG tablet Take 0.5 tablets by mouth daily. 90 tablet 3  . metoprolol succinate (TOPROL-XL) 100 MG 24 hr tablet Take 1 tablet (100 mg total) by mouth daily. Take with or immediately following a meal. 30 tablet 3  . Multiple Vitamin (MULTIVITAMIN) tablet Take 1 tablet by mouth daily.    [name] oxyCODONE-acetaminophen (PERCOCET/ROXICET) 5-325 MG tablet Take 1 tablet by mouth every 6 (six) hours as needed for moderate pain. 6 tablet 0  . PHENobarbital (LUMINAL) 32.4 MG tablet Take 2  tablets (64.8 mg total) by mouth at bedtime. 180 tablet 1  . Probiotic Product (PROBIOTIC PO) Take 1 tablet by mouth daily.    [name] zolpidem (AMBIEN) 10 MG tablet TAKE ONE TABLET BY MOUTH AT BEDTIME AS NEEDED FOR SLEEP 30 tablet 0   No current facility-administered medications for this visit.     REVIEW OF SYSTEMS:  [X]  denotes positive finding, [ ]  denotes negative finding Cardiac  Comments:  Chest pain or chest pressure:    Shortness of breath upon exertion:    Short of breath when lying flat:    Irregular heart rhythm:    Constitutional    Fever or chills:      PHYSICAL EXAM: Vitals:   05/07/16 1512 05/07/16 1516  BP: (!) 161/73 (!) 168/79  Pulse: (!) 58   Resp: 16   Temp: 97 F (36.1 C)   TempSrc: Oral   SpO2: 100%   Weight: 184 lb (83.5 kg)   Height: 6' (1.829 m)     GENERAL: The patient is a well-nourished male, in no acute distress. The vital signs are documented above. CARDIOVASCULAR: There is a regular rate and rhythm. PULMONARY: There is good air exchange bilaterally without wheezing or rales. Left carotid endarterectomy incision is healing nicely.  There is a healing ridge.  This area is very soft.  He is neurologically intact.  MEDICAL ISSUES: The patient will follow up in 6 months with a carotid duplex.  He is scheduled to see cardiology in the near future for further evaluation of his dizziness,  which has been thought to be attributed to hypotension secondary to medications.  We also discussed the possibility of a referral to ENT or neurology if his blood pressure symptoms cannot be attributed to his dizziness.  Annamarie Major, MD [facility] [phone] Pager [phone]

## 2016-05-09 ENCOUNTER — Telehealth: Payer: Self-pay | Admitting: Surgery

## 2016-05-09 NOTE — Telephone Encounter (Signed)
I spoke with patient regarding the upcoming referral appt w/ cardiology from Brad James.He is scheduled to see Dyke Maes for Dr.Nahser on 05/14/16 at 11am. He is to arrive at 10:45am.I  gave the patient their address and phone # as well.  [place]

## 2016-05-10 ENCOUNTER — Other Ambulatory Visit: Payer: Self-pay | Admitting: *Deleted

## 2016-05-10 DIAGNOSIS — I6523 Occlusion and stenosis of bilateral carotid arteries: Secondary | ICD-10-CM

## 2016-05-11 ENCOUNTER — Encounter: Payer: Self-pay | Admitting: Cardiology

## 2016-05-13 NOTE — Progress Notes (Addendum)
Cardiology Office Note   Date:  05/14/2016   ID:  Brad James, DOB 1926/10/29, MRN [id number]  PCP:  Brad Pacas, DO  Cardiologist:  Dr. Acie Fredrickson    Chief Complaint  Patient presents with  . Carotid stenosis, unspecified laterality    no sx      History of Present Illness: Brad James is a 82 y.o. male who presents for post hospital 04/21/16 s/p L CEA and return to hospital for difficulty swallowing.    Swallowing improved prior to swallowing study.   Pt had L CEA on 04/18/16  After symptoms of dizziness and presyncopal episodes.     He has a history of coronary  stents. He's had stenting in [place] and in [place]. He denies any angina. He plays golf on a regular basis. He denied chest pain prior to surgery.  Has HTN.  He had nuc study and EF 61% + defect in basal inf and mid inf - no ischemia  Low risk study.  Considered low risk for surgery.    Today he has no chest pain or SOB, his swallowing is stable.  No Problems.  Unfortunately he is still dizzy defined as lightheaded.    He will get up and get halfway across the room and get very lightheaded.  He does not feel any better since surgery.    No awareness of rapid heart rate.   Past Medical History:  Diagnosis Date  . CAD (coronary artery disease) 01/04/2014  . Carotid artery stenosis 01/04/2014  . Dizzy spells    At times per pt  . Essential hypertension, benign 01/04/2014  . Glaucoma    Bilateral  . Hyperlipidemia 01/04/2014  . Hypertension   . Insomnia 01/04/2014    Past Surgical History:  Procedure Laterality Date  . COLONOSCOPY    . CORONARY ANGIOPLASTY WITH STENT PLACEMENT     stated 3-4 over past 10 yrs.   [name] ENDARTERECTOMY Left 04/18/2016   Procedure: LEFT CAROTID ENDARTERECTOMY;  Surgeon: Serafina Mitchell, MD;  Location: [facility];  Service: Vascular;  Laterality: Left;  [name] MELANOMA EXCISION    . PATCH ANGIOPLASTY Left 04/18/2016   Procedure: PATCH ANGIOPLASTY USING Rueben Bash BIOLOGIC PATCH;  Surgeon: Serafina Mitchell,  MD;  Location: [facility] OR;  Service: Vascular;  Laterality: Left;     Current Outpatient Prescriptions  Medication Sig Dispense Refill  . aspirin EC 325 MG tablet Take 1 tablet (325 mg total) by mouth daily. 365 tablet 0  . atorvastatin (LIPITOR) 80 MG tablet Take 1 tablet (80 mg total) by mouth daily. F/u labs in March 2017 30 tablet 11  . brinzolamide (AZOPT) 1 % ophthalmic suspension Place 1 drop into both eyes 2 (two) times daily. 10 mL 1  . ferrous sulfate 325 (65 FE) MG EC tablet Take 325 mg by mouth daily with breakfast.    . lisinopril-hydrochlorothiazide (PRINZIDE,ZESTORETIC) 20-25 MG tablet Take 0.5 tablets by mouth daily. 90 tablet 3  . metoprolol succinate (TOPROL-XL) 100 MG 24 hr tablet Take 1 tablet (100 mg total) by mouth daily. Take with or immediately following a meal. 30 tablet 3  . Multiple Vitamin (MULTIVITAMIN) tablet Take 1 tablet by mouth daily.    [name] oxyCODONE-acetaminophen (PERCOCET/ROXICET) 5-325 MG tablet Take 1 tablet by mouth every 6 (six) hours as needed for moderate pain. 6 tablet 0  . PHENobarbital (LUMINAL) 32.4 MG tablet Take 2 tablets (64.8 mg total) by mouth at bedtime. 180 tablet 1  . Probiotic Product (PROBIOTIC PO) Take  1 tablet by mouth daily.    [name] zolpidem (AMBIEN) 10 MG tablet TAKE ONE TABLET BY MOUTH AT BEDTIME AS NEEDED FOR SLEEP 30 tablet 0   No current facility-administered medications for this visit.     Allergies:   Review of patient's allergies indicates no known allergies.    Social History:  The patient  reports that he quit smoking about 47 years ago. He has never used smokeless tobacco. He reports that he does not drink alcohol or use drugs.   Family History:  The patient's family history includes Cancer (age of onset: 68) in his father; Heart disease in his mother; Stroke (age of onset: 80) in his brother.    ROS:  General:no colds or fevers, no weight changes Skin:no rashes or ulcers HEENT:no blurred vision, no congestion CV:see  HPI PUL:see HPI GI:no diarrhea constipation or melena, no indigestion GU:no hematuria, no dysuria MS:no joint pain, no claudication Neuro:no syncope, no lightheadedness Endo:no diabetes, no thyroid disease  Wt Readings from Last 3 Encounters:  05/14/16 186 lb 12.8 oz (84.7 kg)  05/07/16 184 lb (83.5 kg)  04/20/16 190 lb 1.6 oz (86.2 kg)     PHYSICAL EXAM: VS:  BP (!) 160/76 (BP Location: Right Arm, Patient Position: Sitting, Cuff Size: Normal)   Pulse 68   Ht 6' (1.829 m)   Wt 186 lb 12.8 oz (84.7 kg)   BMI 25.33 kg/m  , BMI Body mass index is 25.33 kg/m. General:Pleasant affect, NAD Skin:Warm and dry, brisk capillary refill HEENT:normocephalic, sclera clear, mucus membranes moist Neck:supple, no JVD, no bruits,Lt neck with swelling no drainage at surgical site.  Heart:S1S2 RRR without murmur, gallup, rub or click Lungs:clear without rales, rhonchi, or wheezes [id number], non tender, + BS, do not palpate liver spleen or masses Ext:no lower ext edema, 2+ radial pulses Neuro:alert and oriented X 3, MAE, follows commands, + facial symmetry  Lying BP  121/68 P 59 Sitting BP  117/66  p 61 Standing    107/56  p63 3 min standing  126/68  P 64  EKG:  EKG is NOT ordered today.    Recent Labs: 04/20/2016: ALT 17; BUN 16; Creatinine, Ser 1.18; Hemoglobin 12.3; Platelets 195; Potassium 3.5; Sodium 143    Lipid Panel    Component Value Date/Time   CHOL 166 09/14/2014 0851   TRIG 240 (H) 09/14/2014 0851   HDL 37 (L) 09/14/2014 0851   CHOLHDL 4.5 09/14/2014 0851   VLDL 48 (H) 09/14/2014 0851   LDLCALC 81 09/14/2014 0851       Other studies Reviewed: Additional studies/ records that were reviewed today include: .  Nuc study 04/06/16 Study Highlights    Nuclear stress EF: 61%.  There was no ST segment deviation noted during stress.  Defect 1: There is a medium defect of severe severity present in the basal inferior, mid inferior and apex location.  This is a low risk  study.  The left ventricular ejection fraction is normal (55-65%).   Low risk stress nuclear study with prior inferior and apical infarct vs thinning; no ischemia; EF 61 with normal wall motion    ASSESSMENT AND PLAN:  1. dizziness -continued episodes.  Will have him wear 24 hour monitor.  Will stop the HCTZ portion of his lisnopril hct.  He may need higher dose of ACE will monitor BP.   2. Carotid artery disease 3. Hypertension 4. Hyperlipidemia 5. CAD - s/p stenting in the remote past    Current medicines are  reviewed with the patient today.  The patient Has no concerns regarding medicines.  The following changes have been made:  See above Labs/ tests ordered today include:see above  Disposition:   FU:  see above  Signed, Cecilie Kicks, NP  05/14/2016 10:58 AM    [facility], [place], [place], [place] Phone: [phone]; Fax: [phone]

## 2016-05-14 ENCOUNTER — Ambulatory Visit (INDEPENDENT_AMBULATORY_CARE_PROVIDER_SITE_OTHER): Payer: Medicare HMO | Admitting: Cardiology

## 2016-05-14 ENCOUNTER — Encounter: Payer: Self-pay | Admitting: Cardiology

## 2016-05-14 VITALS — BP 160/76 | HR 68 | Ht 72.0 in | Wt 186.8 lb

## 2016-05-14 DIAGNOSIS — R42 Dizziness and giddiness: Secondary | ICD-10-CM

## 2016-05-14 DIAGNOSIS — E785 Hyperlipidemia, unspecified: Secondary | ICD-10-CM

## 2016-05-14 DIAGNOSIS — I10 Essential (primary) hypertension: Secondary | ICD-10-CM

## 2016-05-14 DIAGNOSIS — I6529 Occlusion and stenosis of unspecified carotid artery: Secondary | ICD-10-CM

## 2016-05-14 DIAGNOSIS — I2510 Atherosclerotic heart disease of native coronary artery without angina pectoris: Secondary | ICD-10-CM

## 2016-05-14 MED ORDER — LISINOPRIL 10 MG PO TABS: 10.0000 mg | ORAL_TABLET | Freq: Every day | ORAL | 11 refills | Status: DC

## 2016-05-14 NOTE — Discharge Summary (Signed)
Discharge Summary     Brad James 1926/11/03 82 y.o. male  [id number]  Admission Date: 04/18/2016  Discharge Date: 04/19/16  Physician: No att. providers found  Admission Diagnosis: Left carotid artery stenosis I65.22   HPI:   This is a 82 y.o. male who is referred today for evaluation of carotid stenosis. The patient has a history of dizziness and presyncopal episodes. He also has known carotid stenosis. This was recently checked with ultrasound which showed greater than 70% bilateral stenosis. He denies any localizing symptoms such as numbness or weakness in either extremity, slurred speech, or amaurosis fugax.  The patient suffers from hypercholesterolemia which is managed with a statin. His blood pressure medications were recently manipulated. He is now on a beta blocker and ACE inhibitor. He has a history of coronary artery disease and has been stented 3 times and [place]. He states he has never had a heart attack. He is a former smoker and quit in 1970. 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 He is retired  Hospital Course:  The patient was admitted to the hospital and taken to the operating room on 04/18/2016 and underwent left carotid endarterectomy.  The pt tolerated the procedure well and was transported to the PACU in good condition.  Intraoperative findings included 85% stenosis.   By POD 1, the pt neuro status was in tact.  He did have some complaints of sore throat, but he did not have any difficulty swallowing.  He was ambulating well and voiding well.  He did have acute surgical blood loss anemia, but was tolerating. His creatinine was improved from pre op.  He ate his breakfast without difficulty and was cleared for discharge.   The remainder of the hospital course consisted of increasing mobilization and increasing intake of solids without difficulty.     Discharge Instructions    CAROTID Sugery: Call MD for  difficulty swallowing or speaking; weakness in arms or legs that is a new symtom; severe headache.  If you have increased swelling in the neck and/or  are having difficulty breathing, CALL 911    Complete by:  As directed   Call MD for:  redness, tenderness, or signs of infection (pain, swelling, bleeding, redness, odor or green/yellow discharge around incision site)    Complete by:  As directed   Call MD for:  severe or increased pain, loss or decreased feeling  in affected limb(s)    Complete by:  As directed   Call MD for:  temperature >100.5    Complete by:  As directed   Discharge wound care:    Complete by:  As directed   Shower daily with soap and water starting 04/20/16   Driving Restrictions    Complete by:  As directed   No driving for 2 weeks   Lifting restrictions    Complete by:  As directed   No lifting for 2 weeks   Resume previous diet    Complete by:  As directed      Discharge Diagnosis:  Left carotid artery stenosis I65.22  Secondary Diagnosis: Patient Active Problem List   Diagnosis Date Noted  . Primary open angle glaucoma 03/19/2016  . Carotid stenosis 03/12/2016  . Pulmonary nodules 06/08/2015  . Gout 04/18/2015  . Skin cancer 07/22/2014  . Nodular basal cell carcinoma 06/10/2014  . Squamous cell carcinoma in situ 06/10/2014  . Renal artery stenosis ([place]) 02/22/2014  . Chronic pancreatitis ([place]) 02/22/2014  . SMA  stenosis ([place]) 02/22/2014  . Primary open angle glaucoma of both eyes 02/22/2014  . History of basal cell carcinoma 02/22/2014  . CAD (coronary artery disease) 01/04/2014  . Essential hypertension, benign 01/04/2014  . Hyperlipidemia 01/04/2014  . Insomnia 01/04/2014  . Carotid artery stenosis 01/04/2014  . Epilepsy ([place]) 01/04/2014   Past Medical History:  Diagnosis Date  . CAD (coronary artery disease) 01/04/2014  . Carotid artery stenosis 01/04/2014  . Dizzy spells    At times per pt  . Essential hypertension, benign 01/04/2014  . Glaucoma      Bilateral  . Hyperlipidemia 01/04/2014  . Hypertension   . Insomnia 01/04/2014      Medication List    TAKE these medications   aspirin EC 325 MG tablet Take 1 tablet (325 mg total) by mouth daily. What changed:  when to take this   atorvastatin 80 MG tablet Commonly known as:  LIPITOR Take 1 tablet (80 mg total) by mouth daily. F/u labs in March 2017   brinzolamide 1 % ophthalmic suspension Commonly known as:  AZOPT Place 1 drop into both eyes 2 (two) times daily.   ferrous sulfate 325 (65 FE) MG EC tablet Take 325 mg by mouth daily with breakfast.   lisinopril-hydrochlorothiazide 20-25 MG tablet Commonly known as:  PRINZIDE,ZESTORETIC Take 0.5 tablets by mouth daily.   metoprolol succinate 100 MG 24 hr tablet Commonly known as:  TOPROL-XL Take 1 tablet (100 mg total) by mouth daily. Take with or immediately following a meal.   multivitamin tablet Take 1 tablet by mouth daily.   oxyCODONE-acetaminophen 5-325 MG tablet Commonly known as:  PERCOCET/ROXICET Take 1 tablet by mouth every 6 (six) hours as needed for moderate pain.   PHENobarbital 32.4 MG tablet Commonly known as:  LUMINAL Take 2 tablets (64.8 mg total) by mouth at bedtime.   PROBIOTIC PO Take 1 tablet by mouth daily.   zolpidem 10 MG tablet Commonly known as:  AMBIEN TAKE ONE TABLET BY MOUTH AT BEDTIME AS NEEDED FOR SLEEP       Prescriptions given: Percocet #6 No Refill  Instructions: 1.  Shower daily with soap and water starting in 04/20/16 2.  No driving x 2 weeks and while taking pain medicaiton 3.  No heavy lifting x 2 weeks.  Disposition: home  Patient's condition: is Good  Follow up: 1. Dr. Trula Slade in 2 weeks.   Leontine Locket, PA-C [facility] [phone]  --- For [facility] use --- Instructions: Press F2 to tab through selections.  Delete question if not applicable.   Modified Rankin score at D/C (0-6): 0  IV medication needed for:  1.  Hypertension: No 2. Hypotension: No  Post-op Complications: No  1. Post-op CVA or TIA: No  If yes: Event classification (right eye, left eye, right cortical, left cortical, verterobasilar, other): n/a  If yes: Timing of event (intra-op, <6 hrs post-op, >=6 hrs post-op, unknown): n/a  2. CN injury: No  If yes: CN n/a injuried   3. Myocardial infarction: No  If yes: Dx by (EKG or clinical, Troponin): n/a  4.  CHF: No  5.  Dysrhythmia (new): No  6. Wound infection: No  7. Reperfusion symptoms: No  8. Return to OR: No  If yes: return to OR for (bleeding, neurologic, other CEA incision, other): n/a  Discharge medications: Statin use:  Yes If No: [ ]  For Medical reasons, [ ]  Non-compliant, [ ]  Not-indicated ASA use:  Yes  If No: [ ]   For Medical reasons, [ ]  Non-compliant, [ ]  Not-indicated Beta blocker use:  Yes If No: [ ]  For Medical reasons, [ ]  Non-compliant, [ ]  Not-indicated ACE-Inhibitor use:  Yes If No: [ ]  For Medical reasons, [ ]  Non-compliant, [ ]  Not-indicated ARB use:  No P2Y12 Antagonist use: No, [ ]  Plavix, [ ]  Plasugrel, [ ]  Ticlopinine, [ ]  Ticagrelor, [ ]  Other, [ ]  No for medical reason, [ ]  Non-compliant, [ ]  Not-indicated Anti-coagulant use:  No, [ ]  Warfarin, [ ]  Rivaroxaban, [ ]  Dabigatran, [ ]  Other, [ ]  No for medical reason, [ ]  Non-compliant, [ ]  Not-indicated

## 2016-05-14 NOTE — Patient Instructions (Addendum)
Medication Instructions:  1) STOP PRINZIDE 2) START LISINOPRIL 10 mg daily  Labwork: None ordered   Testing/Procedures: Your physician has recommended that you wear a holter monitor. Holter monitors are medical devices that record the heart's electrical activity. Doctors most often use these monitors to diagnose arrhythmias. Arrhythmias are problems with the speed or rhythm of the heartbeat. The monitor is a small, portable device. You can wear one while you do your normal daily activities. This is usually used to diagnose what is causing palpitations/syncope (passing out).  Follow-Up: Your physician recommends that you schedule a follow-up appointment in 2 weeks with Cecilie Kicks or one of Dr. Elmarie Shiley other assistants.  Your physician recommends that you schedule a follow-up appointment in 2-3 months with Dr. Acie Fredrickson.  Any Other Special Instructions Will Be Listed Below (If Applicable).     If you need a refill on your cardiac medications before your next appointment, please call your pharmacy.

## 2016-05-16 ENCOUNTER — Other Ambulatory Visit: Payer: Self-pay | Admitting: Family Medicine

## 2016-05-29 ENCOUNTER — Ambulatory Visit: Payer: Medicare HMO | Admitting: Physician Assistant

## 2016-06-08 ENCOUNTER — Ambulatory Visit: Payer: Medicare HMO | Admitting: Physician Assistant

## 2016-06-11 ENCOUNTER — Other Ambulatory Visit: Payer: Self-pay | Admitting: Family Medicine

## 2016-06-13 ENCOUNTER — Other Ambulatory Visit: Payer: Self-pay

## 2016-06-13 MED ORDER — ZOLPIDEM TARTRATE 10 MG PO TABS: 10.0000 mg | ORAL_TABLET | Freq: Every evening | ORAL | 0 refills | Status: DC | PRN

## 2016-06-13 MED ORDER — METOPROLOL SUCCINATE ER 100 MG PO TB24: 100.0000 mg | ORAL_TABLET | Freq: Every day | ORAL | 3 refills | Status: DC

## 2016-06-14 ENCOUNTER — Encounter: Payer: Self-pay | Admitting: Osteopathic Medicine

## 2016-06-14 ENCOUNTER — Ambulatory Visit (INDEPENDENT_AMBULATORY_CARE_PROVIDER_SITE_OTHER): Payer: Medicare HMO | Admitting: Osteopathic Medicine

## 2016-06-14 VITALS — BP 185/73 | HR 60 | Ht 72.0 in | Wt 185.0 lb

## 2016-06-14 DIAGNOSIS — L309 Dermatitis, unspecified: Secondary | ICD-10-CM | POA: Diagnosis not present

## 2016-06-14 DIAGNOSIS — Z23 Encounter for immunization: Secondary | ICD-10-CM

## 2016-06-14 DIAGNOSIS — G4700 Insomnia, unspecified: Secondary | ICD-10-CM | POA: Diagnosis not present

## 2016-06-14 MED ORDER — TRIAMCINOLONE ACETONIDE 0.5 % EX OINT: 1.0000 "application " | TOPICAL_OINTMENT | Freq: Two times a day (BID) | CUTANEOUS | 1 refills | Status: DC

## 2016-06-14 MED ORDER — ZOLPIDEM TARTRATE 5 MG PO TABS: 5.0000 mg | ORAL_TABLET | Freq: Every evening | ORAL | 3 refills | Status: DC | PRN

## 2016-06-14 NOTE — Progress Notes (Signed)
HPI: Brad James is a 82 y.o. male  who presents to [place] today, 06/14/16,  for chief complaint of:  Chief Complaint  Patient presents with  . Establish Care    SWITCHING FROM [facility] DISCUSS AMBIEN     . Context: Receive refill request for Ambien, and an 82 year old patient with barbiturates and opiates on his medication list as well, I had some concerns and asked him to come in for discussion. He uses Ambien remarriage most nights, more so now that wife has been going through cancer diagnosis. He has previous diagnosis of seizure for which she is on phenobarbital and has been for many years. No recent seizure activity. . Quality: Sleep onset/maintenance is a problem . Timing: Most nights . Modifying factors: No previous anti-insomnia medications try that he can recall . Assoc signs/symptoms: No falls, no dizziness    Complains of itching rash along forearms. Intermittent. He has had this rash before which was treated with steroid cream.  Past medical, surgical, social and family history reviewed: Past Medical History:  Diagnosis Date  . CAD (coronary artery disease) 01/04/2014  . Carotid artery stenosis 01/04/2014  . Dizzy spells    At times per pt  . Essential hypertension, benign 01/04/2014  . Glaucoma    Bilateral  . Hyperlipidemia 01/04/2014  . Hypertension   . Insomnia 01/04/2014   Past Surgical History:  Procedure Laterality Date  . COLONOSCOPY    . CORONARY ANGIOPLASTY WITH STENT PLACEMENT     stated 3-4 over past 10 yrs.   [name] ENDARTERECTOMY Left 04/18/2016   Procedure: LEFT CAROTID ENDARTERECTOMY;  Surgeon: Serafina Mitchell, MD;  Location: [place];  Service: Vascular;  Laterality: Left;  [name] MELANOMA EXCISION    . PATCH ANGIOPLASTY Left 04/18/2016   Procedure: PATCH ANGIOPLASTY USING Rueben Bash BIOLOGIC PATCH;  Surgeon: Serafina Mitchell, MD;  Location: [facility] OR;  Service: Vascular;  Laterality: Left;   Social History  Substance Use Topics   . Smoking status: Former Smoker    Quit date: 01/04/1969  . Smokeless tobacco: Never Used  . Alcohol use No   Family History  Problem Relation Age of Onset  . Heart disease Mother   . Cancer Father 9    oral  . Stroke Brother 43     Current medication list and allergy/intolerance information reviewed:   Current Outpatient Prescriptions  Medication Sig Dispense Refill  . aspirin EC 325 MG tablet Take 1 tablet (325 mg total) by mouth daily. 365 tablet 0  . atorvastatin (LIPITOR) 80 MG tablet Take 1 tablet (80 mg total) by mouth daily. F/u labs in March 2017 30 tablet 11  . brinzolamide (AZOPT) 1 % ophthalmic suspension Place 1 drop into both eyes 2 (two) times daily. 10 mL 1  . ferrous sulfate 325 (65 FE) MG EC tablet Take 325 mg by mouth daily with breakfast.    . lisinopril (PRINIVIL,ZESTRIL) 10 MG tablet Take 1 tablet (10 mg total) by mouth daily. 30 tablet 11  . metoprolol succinate (TOPROL-XL) 100 MG 24 hr tablet Take 1 tablet (100 mg total) by mouth daily. Take with or immediately following a meal. 30 tablet 3  . Multiple Vitamin (MULTIVITAMIN) tablet Take 1 tablet by mouth daily.    [name] oxyCODONE-acetaminophen (PERCOCET/ROXICET) 5-325 MG tablet Take 1 tablet by mouth every 6 (six) hours as needed for moderate pain. 6 tablet 0  . PHENobarbital (LUMINAL) 32.4 MG tablet Take 2 tablets (64.8 mg total) by mouth  at bedtime. NEED FOLLOW UP VISIT FOR MORE REFILLS 60 tablet 0  . Probiotic Product (PROBIOTIC PO) Take 1 tablet by mouth daily.    [name] zolpidem (AMBIEN) 10 MG tablet Take 1 tablet (10 mg total) by mouth at bedtime as needed. for sleep.  NEED FOLLOW UP APPOINTMENT FOR MORE REFILLS 30 tablet 0   No current facility-administered medications for this visit.    No Known Allergies    Review of Systems:  Constitutional:  No  fever, no chills, No recent illness, No unintentional weight changes. No significant fatigue.   HEENT: No  headache, no vision change,   Cardiac: No  chest  pain  Respiratory:  No  shortness of breath.   Neurologic: No  weakness, No  dizziness, No  slurred speech  Psychiatric: No  concerns with depression, No  concerns with anxiety, +sleep problems, No mood problems  Exam:  BP (!) 185/73   Pulse 60   Ht 6' (1.829 m)   Wt 185 lb (83.9 kg)   BMI 25.09 kg/m   Constitutional: VS see above. General Appearance: alert, well-developed, well-nourished, NAD  Neck: No masses, trachea midline.   Respiratory: Normal respiratory effort.  Cardiovascular: S1/S2 normal, no murmur,   Skin: warm, dry, intact. +rash - mild erythematous/scaly macule, scattered, small, along arms.  Psychiatric: Normal judgment/insight. Normal mood and affect. Oriented x3.     ASSESSMENT/PLAN: Reduce dose of Ambien, patient is amenable to this plan and is understanding about my concerns given multiple sedatives. He does not take the pain medication except very rarely. Trial Kenalog ointment for itching/rash, consider biopsy if no improvement in one week  Insomnia  Dermatitis - Plan: triamcinolone ointment (KENALOG) 0.5 %  Need for prophylactic vaccination and inoculation against influenza - Plan: Flu Vaccine QUAD 36+ mos IM    Visit summary with medication list and pertinent instructions was printed for patient to review. All questions at time of visit were answered - patient instructed to contact office with any additional concerns. ER/RTC precautions were reviewed with the patient. Follow-up plan: Return in about 1 week (around 06/21/2016) for recheck/biopsy if rash is not better.

## 2016-06-21 ENCOUNTER — Ambulatory Visit: Payer: Medicare HMO | Admitting: Osteopathic Medicine

## 2016-06-22 ENCOUNTER — Ambulatory Visit: Payer: Medicare HMO | Admitting: Osteopathic Medicine

## 2016-06-28 ENCOUNTER — Telehealth: Payer: Self-pay

## 2016-06-28 NOTE — Telephone Encounter (Signed)
Returned call to pt.  Reported the top of left neck incision has a "firm area" that is raised, and a little red.  Reported "this has happened before, where the area bubbles up, and then goes down."  Reported the incision remains intact.  Denied any warmth at the site, drainage, or fever/chills.  Reported that it is somewhat tender at times.  Requested appt. to have the incision checked.  Appt. Given with NP. On 10/16 @ 1:45 PM.  Agrees with plan.

## 2016-06-28 NOTE — Telephone Encounter (Signed)
-----   Message from Lujean Amel sent at 06/27/2016 12:29 PM EDT ----- Regarding: Triage Question Contact: [phone]  I received a voice mail message from this patient requesting an appt w/ [facility] for a scab/sore area for 2 mos near his surgical carotid scar. Can you please call to assess what and when I should schedule him?  Thanks, Anne Ng

## 2016-07-02 ENCOUNTER — Ambulatory Visit (INDEPENDENT_AMBULATORY_CARE_PROVIDER_SITE_OTHER): Payer: Medicare HMO | Admitting: Sports Medicine

## 2016-07-02 ENCOUNTER — Ambulatory Visit (INDEPENDENT_AMBULATORY_CARE_PROVIDER_SITE_OTHER): Payer: Medicare HMO

## 2016-07-02 ENCOUNTER — Encounter: Payer: Self-pay | Admitting: Sports Medicine

## 2016-07-02 ENCOUNTER — Encounter: Payer: Self-pay | Admitting: Family

## 2016-07-02 ENCOUNTER — Ambulatory Visit: Payer: Medicare HMO | Admitting: Family

## 2016-07-02 ENCOUNTER — Other Ambulatory Visit: Payer: Self-pay | Admitting: Osteopathic Medicine

## 2016-07-02 DIAGNOSIS — I700 Atherosclerosis of aorta: Secondary | ICD-10-CM | POA: Diagnosis not present

## 2016-07-02 DIAGNOSIS — M545 Low back pain, unspecified: Secondary | ICD-10-CM | POA: Insufficient documentation

## 2016-07-02 DIAGNOSIS — G8929 Other chronic pain: Secondary | ICD-10-CM

## 2016-07-02 DIAGNOSIS — M47816 Spondylosis without myelopathy or radiculopathy, lumbar region: Secondary | ICD-10-CM | POA: Diagnosis not present

## 2016-07-02 MED ORDER — PHENOBARBITAL 32.4 MG PO TABS: 64.8000 mg | ORAL_TABLET | Freq: Every day | ORAL | 0 refills | Status: DC

## 2016-07-02 MED ORDER — MELOXICAM 15 MG PO TABS: ORAL_TABLET | ORAL | 3 refills | Status: DC

## 2016-07-02 MED ORDER — PREDNISONE 50 MG PO TABS: ORAL_TABLET | ORAL | 0 refills | Status: DC

## 2016-07-02 NOTE — Assessment & Plan Note (Signed)
Symptoms consistent with lumbar spinal stenosis. Physical therapy, prednisone, x-rays. Refilling meloxicam. Return to see me in one month, MRI for interventional planning if no better.

## 2016-07-02 NOTE — Progress Notes (Signed)
  Subjective:    CC: back pain   HPI: 82 yo M presenting with one week of low back pain.  Patient says he was in his normal state of health until one week ago when he developed a constant, throbbing right lower back pain.  He says pain radiates halfway down his lateral left thigh.  Pain is worse when getting up from a seated position and when walking.  Pain is better when leaning forward.  He just re-started his Meloxicam three days ago.   Past medical history:  Negative.  See flowsheet/record as well for more information.  Surgical history: Negative.  See flowsheet/record as well for more information.  Family history: Negative.  See flowsheet/record as well for more information.  Social history: Negative.  See flowsheet/record as well for more information.  Allergies, and medications have been entered into the medical record, reviewed, and no changes needed.   Review of Systems: No fevers, chills, night sweats, weight loss, chest pain, or shortness of breath.   Objective:    General: Well Developed, well nourished, and in no acute distress.  Neuro: Alert and oriented x3, extra-ocular muscles intact, sensation grossly intact.  HEENT: Normocephalic, atraumatic, pupils equal round reactive to light, neck supple, no masses, no lymphadenopathy, thyroid nonpalpable.  Skin: Warm and dry, no rashes. Cardiac: Regular rate and rhythm, no murmurs rubs or gallops, no lower extremity edema.  Respiratory: Clear to auscultation bilaterally. Not using accessory muscles, speaking in full sentences. Back Exam:  Inspection: Unremarkable  Motion: Flexion 45 deg, Extension 45 deg, Side Bending to 45 deg bilaterally,  Rotation to 45 deg bilaterally  SLR laying: Negative  XSLR laying: Negative  Palpable tenderness: None. FABER: negative. Sensory change: Gross sensation intact to all lumbar and sacral dermatomes.  Reflexes: 2+ at both patellar tendons, 2+ at achilles tendons, Babinski's downgoing.  Strength  at foot  Plantar-flexion: 5/5 Dorsi-flexion: 5/5 Eversion: 5/5 Inversion: 5/5  Leg strength  Quad: 5/5 Hamstring: 5/5 Hip flexor: 5/5 Hip abductors: 5/5  Gait unremarkable.     Impression and Recommendations:    1. R low back pain - likely spinal stenosis  -L-spine X-rays today  -5 day course of steroids -Meloxicam daily -Formal PT -Return in one month for follow-up, if pain persists will get MRI for epidural planning

## 2016-07-02 NOTE — Telephone Encounter (Signed)
Scheduling an appt for the patient for 07/02/16 at 1:45pm per Carol's instructions/awt

## 2016-07-04 ENCOUNTER — Ambulatory Visit: Payer: Medicare HMO | Admitting: Family

## 2016-07-09 ENCOUNTER — Encounter: Payer: Self-pay | Admitting: Osteopathic Medicine

## 2016-07-09 ENCOUNTER — Ambulatory Visit (INDEPENDENT_AMBULATORY_CARE_PROVIDER_SITE_OTHER): Payer: Medicare HMO | Admitting: Osteopathic Medicine

## 2016-07-09 VITALS — BP 191/78 | HR 63 | Ht 73.0 in | Wt 184.0 lb

## 2016-07-09 DIAGNOSIS — I10 Essential (primary) hypertension: Secondary | ICD-10-CM

## 2016-07-09 DIAGNOSIS — I6523 Occlusion and stenosis of bilateral carotid arteries: Secondary | ICD-10-CM

## 2016-07-09 DIAGNOSIS — F5101 Primary insomnia: Secondary | ICD-10-CM

## 2016-07-09 DIAGNOSIS — M545 Low back pain: Secondary | ICD-10-CM | POA: Diagnosis not present

## 2016-07-09 DIAGNOSIS — Z87898 Personal history of other specified conditions: Secondary | ICD-10-CM | POA: Diagnosis not present

## 2016-07-09 DIAGNOSIS — R69 Illness, unspecified: Secondary | ICD-10-CM | POA: Diagnosis not present

## 2016-07-09 DIAGNOSIS — I2510 Atherosclerotic heart disease of native coronary artery without angina pectoris: Secondary | ICD-10-CM

## 2016-07-09 DIAGNOSIS — G8929 Other chronic pain: Secondary | ICD-10-CM

## 2016-07-09 MED ORDER — OMEPRAZOLE 20 MG PO CPDR: 20.0000 mg | DELAYED_RELEASE_CAPSULE | Freq: Every day | ORAL | 3 refills | Status: DC

## 2016-07-09 MED ORDER — NAPROXEN 500 MG PO TABS: 500.0000 mg | ORAL_TABLET | Freq: Two times a day (BID) | ORAL | 3 refills | Status: DC

## 2016-07-09 NOTE — Progress Notes (Signed)
HPI: Brad James is a 82 y.o. male  who presents to [place] today, 07/09/16,  for chief complaint of:  Chief Complaint  Patient presents with  . Follow-up    BACK PAIN    Back pain . Context: No injury. Recently saw sports medicine Dr T for this issue. Was prescribed meloxicam, prednisone for 5 days, physical therapy. Patient has not yet been to PT, states if pain remains at current levels may have trouble completing physical therapy. . Location: Low back worse on R . Quality: Throbbing, sharp . Duration: 2 weeks total . Modifying factors: Worse when walking, rising from seated or lying down position . Assoc signs/symptoms: No numbness, no weakness, no falling   Other medical history briefly reviewed.  Patient has opiate pain medications on his list, states he thinks this was after his surgery but he has not taken this in some time. We'll remove these from his list.  On other sedative medications including Ambien which dose was recently reduced, phenobarbital for history of seizure but previous workup for this in [place] sounds a bit spotty, not convinced that he has a true seizure diagnosis, he is very reluctant to, off of this medication however  On aspirin 325, recently taken off blood thinners, following with cardiology for coronary artery disease, recently surgically corrected carotid artery stenosis.   Past medical, surgical, social and family history reviewed: Past Medical History:  Diagnosis Date  . CAD (coronary artery disease) 01/04/2014  . Carotid artery stenosis 01/04/2014  . Dizzy spells    At times per pt  . Essential hypertension, benign 01/04/2014  . Glaucoma    Bilateral  . Hyperlipidemia 01/04/2014  . Hypertension   . Insomnia 01/04/2014   Past Surgical History:  Procedure Laterality Date  . COLONOSCOPY    . CORONARY ANGIOPLASTY WITH STENT PLACEMENT     stated 3-4 over past 10 yrs.   [name] ENDARTERECTOMY Left 04/18/2016    Procedure: LEFT CAROTID ENDARTERECTOMY;  Surgeon: Serafina Mitchell, MD;  Location: [place];  Service: Vascular;  Laterality: Left;  [name] MELANOMA EXCISION    . PATCH ANGIOPLASTY Left 04/18/2016   Procedure: PATCH ANGIOPLASTY USING Rueben Bash BIOLOGIC PATCH;  Surgeon: Serafina Mitchell, MD;  Location: [facility] OR;  Service: Vascular;  Laterality: Left;   Social History  Substance Use Topics  . Smoking status: Former Smoker    Quit date: 01/04/1969  . Smokeless tobacco: Never Used  . Alcohol use No   Family History  Problem Relation Age of Onset  . Heart disease Mother   . Cancer Father 42    oral  . Stroke Brother 8     Current medication list and allergy/intolerance information reviewed:   Current Outpatient Prescriptions on File Prior to Visit  Medication Sig Dispense Refill  . aspirin EC 325 MG tablet Take 1 tablet (325 mg total) by mouth daily. 365 tablet 0  . atorvastatin (LIPITOR) 80 MG tablet Take 1 tablet (80 mg total) by mouth daily. F/u labs in March 2017 30 tablet 11  . brinzolamide (AZOPT) 1 % ophthalmic suspension Place 1 drop into both eyes 2 (two) times daily. 10 mL 1  . ferrous sulfate 325 (65 FE) MG EC tablet Take 325 mg by mouth daily with breakfast.    . lisinopril (PRINIVIL,ZESTRIL) 10 MG tablet Take 1 tablet (10 mg total) by mouth daily. 30 tablet 11  . meloxicam (MOBIC) 15 MG tablet One tab PO qAM with breakfast for 2  weeks, then daily prn pain. 30 tablet 3  . metoprolol succinate (TOPROL-XL) 100 MG 24 hr tablet Take 1 tablet (100 mg total) by mouth daily. Take with or immediately following a meal. 30 tablet 3  . Multiple Vitamin (MULTIVITAMIN) tablet Take 1 tablet by mouth daily.    [name] oxyCODONE-acetaminophen (PERCOCET/ROXICET) 5-325 MG tablet Take 1 tablet by mouth every 6 (six) hours as needed for moderate pain. 6 tablet 0  . PHENobarbital (LUMINAL) 32.4 MG tablet Take 2 tablets (64.8 mg total) by mouth at bedtime. 60 tablet 0  . predniSONE (DELTASONE) 50 MG tablet One tab PO  daily for 5 days. 5 tablet 0  . Probiotic Product (PROBIOTIC PO) Take 1 tablet by mouth daily.    [name] triamcinolone ointment (KENALOG) 0.5 % Apply 1 application topically 2 (two) times daily. To affected area/rash, avoid eyes and face 30 g 1  . zolpidem (AMBIEN) 5 MG tablet Take 1 tablet (5 mg total) by mouth at bedtime as needed for sleep. 30 tablet 3   No current facility-administered medications on file prior to visit.    No Known Allergies    Review of Systems:  Constitutional: No recent illness  HEENT: No  headache, no vision change  Cardiac: No  chest pain, No  pressure, No palpitations  Respiratory:  No  shortness of breath. No  Cough  Gastrointestinal: No  abdominal pain, no change on bowel habits  Musculoskeletal: No new myalgia/arthralgia, backpain as per HPI  Skin: No  Rash  Neurologic: No  weakness, No  Dizziness   Exam:  BP (!) 191/78   Pulse 63   Ht 6\' 1"  (1.854 m)   Wt 184 lb (83.5 kg)   BMI 24.28 kg/m   Constitutional: VS see above. General Appearance: alert, well-developed, well-nourished, NAD  Eyes: Normal lids and conjunctive, non-icteric sclera  Ears, Nose, Mouth, Throat: MMM, Normal external inspection ears/nares/mouth/lips/gums.  Neck: No masses, trachea midline.   Respiratory: Normal respiratory effort. no wheeze, no rhonchi, no rales  Cardiovascular: S1/S2 normal, no murmur, no rub/gallop auscultated. RRR. BP as above, patient states elevated due to pain  Musculoskeletal: Gait normal. Symmetric and independent movement of all extremities.   Straight leg raise negative bilaterally.   No midline spinal tenderness  Mild tender to palpation/and or point right lower lumbar area adjacent to L5  Strength of plantarflexion, dorsiflexion, hip flexion, knee flexion 5 out of 5 bilaterally  No tenderness over right trochanteric bursa  Neurological: Normal balance/coordination. No tremor.  Skin: warm, dry, intact.   Psychiatric: Normal  judgment/insight. Normal mood and affect. Oriented x3.      ASSESSMENT/PLAN: Risk of NSAID use discussed with patient in terms of bleeding risk (particularly since he is on high-dose aspirin at instruction of his CV team per patient, following with cardiology next few weeks)  and we are adding an anti-inflammatory for pain control purposes. Patient is not taking opiates, and given other sedative medications including Ambien and phenobarbital I'm reluctant to add other sedative such as gabapentin or muscle relaxants. I think getting into physical therapy will be most beneficial for the patient. Also printed him home stretches and exercises to do at least twice a day/as tolerated on his own.  Chronic right-sided low back pain without sciatica - Get to see physical therapy, added alternative NSAID with GI protection, follow-up with sports medicine consider MRI - Plan: naproxen (NAPROSYN) 500 MG tablet, omeprazole (PRILOSEC) 20 MG capsule  Primary insomnia  History of seizure - Not  convinced the benefits of the phenobarbital outweigh the risks, discussed this briefly with patient, he is nervous to come off this medicine  Essential hypertension, benign - Follow-up at next visit, history of dizziness, following with cardiology in the next few weeks, pain today exacerbating factor  Coronary artery disease involving native coronary artery of native heart without angina pectoris  Bilateral carotid artery stenosis    Patient Instructions  Get in to physical therapy New antiinflammatory, ok to take with Tylenol, stop Mobic/meloxicam, take with antacid prescription to reduce risk of bleeding,     Visit summary with medication list and pertinent instructions was printed for patient to review. All questions at time of visit were answered - patient instructed to contact office with any additional concerns. ER/RTC precautions were reviewed with the patient. Follow-up plan: Return for [facility] PHYSICAL WHEN  DUE and follow-up with Dr. Darene Lamer for back pain as scheduled.   Patient advised to keep all of his upcoming appointments with specialists particularly cardiology

## 2016-07-09 NOTE — Patient Instructions (Signed)
Get in to physical therapy New antiinflammatory, ok to take with Tylenol, stop Mobic/meloxicam, take with antacid prescription to reduce risk of bleeding,

## 2016-07-11 ENCOUNTER — Encounter: Payer: Self-pay | Admitting: Rehabilitative and Restorative Service Providers"

## 2016-07-11 ENCOUNTER — Ambulatory Visit (INDEPENDENT_AMBULATORY_CARE_PROVIDER_SITE_OTHER): Payer: Medicare HMO | Admitting: Rehabilitative and Restorative Service Providers"

## 2016-07-11 DIAGNOSIS — R29898 Other symptoms and signs involving the musculoskeletal system: Secondary | ICD-10-CM

## 2016-07-11 DIAGNOSIS — R531 Weakness: Secondary | ICD-10-CM

## 2016-07-11 DIAGNOSIS — M545 Low back pain, unspecified: Secondary | ICD-10-CM

## 2016-07-11 NOTE — Patient Instructions (Signed)
HIP: Hamstrings - Supine   Place strap around foot. Raise leg up, keeping knee straight.  Bend opposite knee to protect back if indicated. Hold 30 seconds. 3 reps per set, 2-3 sets per day  Double Knee to Chest (Flexion)    Gently pull both knees toward chest. Feel stretch in lower back or buttock area. Breathing deeply, Hold _20___ seconds. Repeat _5___ times. Do __2-3__ sessions per day.   Quads / HF, Prone   Lie face down. Grasp one ankle with same-side hand. Use towel if needed to reach. Gently pull foot toward buttock.  Hold 30 seconds. Repeat 3 times per session. Do 2-3 sessions per day.   TENS UNIT: This is helpful for muscle pain and spasm.   Search and Purchase a TENS 7000 2nd edition at www.tenspros.com. It should be less than $30.     TENS unit instructions: Do not shower or bathe with the unit on Turn the unit off before removing electrodes or batteries If the electrodes lose stickiness add a drop of water to the electrodes after they are disconnected from the unit and place on plastic sheet. If you continued to have difficulty, call the TENS unit company to purchase more electrodes. Do not apply lotion on the skin area prior to use. Make sure the skin is clean and dry as this will help prolong the life of the electrodes. After use, always check skin for unusual red areas, rash or other skin difficulties. If there are any skin problems, does not apply electrodes to the same area. Never remove the electrodes from the unit by pulling the wires. Do not use the TENS unit or electrodes other than as directed. Do not change electrode placement without consultating your therapist or physician. Keep 2 fingers with between each electrode.

## 2016-07-11 NOTE — Therapy (Addendum)
[place], [place], [address] Phone: [phone]   Fax:  [phone]  Physical Therapy Evaluation  Patient Details  Name: Brad James MRN: [id number] Date of Birth: 10/09/1926 Referring Provider: Dr Dianah Field  Encounter Date: 07/11/2016      PT End of Session - 07/11/16 1318    Visit Number 1   Number of Visits 12   Date for PT Re-Evaluation 08/22/16   PT Start Time 1019   PT Stop Time 1122   PT Time Calculation (min) 63 min   Activity Tolerance Patient tolerated treatment well      Past Medical History:  Diagnosis Date  . CAD (coronary artery disease) 01/04/2014  . Carotid artery stenosis 01/04/2014  . Dizzy spells    At times per pt  . Essential hypertension, benign 01/04/2014  . Glaucoma    Bilateral  . Hyperlipidemia 01/04/2014  . Hypertension   . Insomnia 01/04/2014    Past Surgical History:  Procedure Laterality Date  . COLONOSCOPY    . CORONARY ANGIOPLASTY WITH STENT PLACEMENT     stated 3-4 over past 10 yrs.   [name] ENDARTERECTOMY Left 04/18/2016   Procedure: LEFT CAROTID ENDARTERECTOMY;  Surgeon: Serafina Mitchell, MD;  Location: [facility];  Service: Vascular;  Laterality: Left;  [name] MELANOMA EXCISION    . PATCH ANGIOPLASTY Left 04/18/2016   Procedure: PATCH ANGIOPLASTY USING Rueben Bash BIOLOGIC PATCH;  Surgeon: Serafina Mitchell, MD;  Location: [facility];  Service: Vascular;  Laterality: Left;    There were no vitals filed for this visit.       Subjective Assessment - 07/11/16 1025    Subjective Acute Rt LBP ovewr the past two weeks with no known cause. He has pain with standing and walking which progressively worsens as he continues.    Pertinent History See medical history - pt denies any musculoskeletal problems/injuries - occasional back pain from golfing or ADL's - reports episodes of dizziness with lying down, present for years on and off    How long can you sit comfortably? no limit    How long can you stand comfortably? 2-5 min    How long can you walk comfortably? 5-10 min    Diagnostic tests xrays    Patient Stated Goals get rid of pain    Currently in Pain? Yes   Pain Score 3    Pain Location Back   Pain Orientation Right;Lower   Pain Descriptors / Indicators Sharp   Pain Type Acute pain   Pain Radiating Towards up into the Rt side    Pain Onset 1 to 4 weeks ago   Pain Frequency Intermittent  no pain with sitting - constant with any movement    Aggravating Factors  standing or walking; bending; reaching; lifting; etc    Pain Relieving Factors tylenol            [facility] PT Assessment - 07/11/16 0001      Assessment   Medical Diagnosis Rt sided LBP    Referring Provider Dr Dianah Field   Onset Date/Surgical Date 06/26/16   Hand Dominance Right   Next MD Visit none scheduled - to schedule after therapy   Prior Therapy none     Balance Screen   Has the patient fallen in the past 6 months Yes   How many times? 1   Has the patient had a decrease in activity level because of a fear of falling?  No   Is  the patient reluctant to leave their home because of a fear of falling?  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    Leisure golf 2 x /wk; household chores - caring for wife who is sick      Observation/Other Assessments   Focus on Therapeutic Outcomes (FOTO)  71% limitation      Sensation   Additional Comments WFL's per pt report      Posture/Postural Control   Posture Comments head forward; shoulders rounded and elevated; flexed forward at hips      AROM   Right/Left Hip --  end range tightness bilat    Lumbar Flexion 50%  pulling Rt  LB   Lumbar Extension 10%  stiff    Lumbar - Right Side Bend 50%   Lumbar - Left Side Bend 45%  pulling Rt LB    Lumbar - Right Rotation 20%   Lumbar - Left Rotation 20%     Strength   Strength Assessment Site --  assessed in supine & prone (no sidelying - ab/add in supine)   Right Hip Flexion 4+/5   Right Hip Extension 4-/5   Right Hip ABduction 4+/5   Right Hip ADduction 4+/5   Left Hip Flexion 4+/5   Left Hip Extension 4/5   Left Hip ABduction 4+/5   Left Hip ADduction 4+/5     Flexibility   Hamstrings tight Rt ~55 deg; Lt 65 deg   Quadriceps tight Rt 95 deg; Lt 100 deg    ITB tight Rt > Lt    Piriformis tight Rt > Lt      Palpation   Spinal mobility hypomobile lumbar with CPA and UPA mobs    Palpation comment muscular tightness through the Rt > Lt lumbar paraspinals; Rt QL/lats      Bed Mobility   Bed Mobility --  difficulty w/ transitional mvts sits then lies straight back     Ambulation/Gait   Gait Comments flexed forward gait pattern                    [facility] Adult PT Treatment/Exercise - 07/11/16 0001      Self-Care   Self-Care --  initiated back care ed - sitting modifying recliner     Therapeutic Activites    Therapeutic Activities --  started working on Economist sit-supine & reverse     Lumbar Exercises: Stretches   Passive Hamstring Stretch 3 reps;30 seconds   Double Knee to Chest Stretch 3 reps;20 seconds   Quad Stretch 3 reps;30 seconds     Moist Heat Therapy   Number Minutes Moist Heat 20 Minutes   Moist Heat Location Lumbar Spine     Electrical Stimulation   Electrical Stimulation Location Rt lumbar spine/QL   Electrical Stimulation Action IFC   Electrical Stimulation Parameters to tolerance   Electrical Stimulation Goals Pain;Tone  muscular tightness      Manual Therapy   Manual therapy comments pt prone    Joint Mobilization lumbar joint mobs Grade II CPA and UPA mobs    Soft tissue mobilization bilat lumbar  paraspinals; Rt QL/lats  Myofascial Release Rt lumbar                 PT Education - 07/11/16 1057    Education provided Yes   Education Details HEP; TENs info    Person(s) Educated Patient   Methods Explanation;Demonstration;Tactile cues;Verbal cues;Handout   Comprehension Verbalized understanding;Returned demonstration;Verbal cues required;Tactile cues required             PT Long Term Goals - 07/11/16 1322      PT LONG TERM GOAL #1   Title Improve flexibility and tissue extensibilty through LE's and trunk with patient to demonstrate increased hamstring flexibility to 75 deg bilat 08/22/16   Time 6   Period Weeks     PT LONG TERM GOAL #2   Title Increased bilat LE strength to 5-/5 to 5/5 08/22/16   Time 6   Period Weeks   Status New     PT LONG TERM GOAL #3   Title Increase functional activity tolerance allowing patient to ambulate 20-30 min with minimal to no pain 08/22/16   Time 6   Period Weeks   Status New     PT LONG TERM GOAL #4   Title Independent in HEP 08/22/16   Time 6   Period Weeks   Status New     PT LONG TERM GOAL #5   Title Improve FOTO to </= 42% limitation 08/22/16   Time 6   Period Weeks   Status New               Plan - 07/11/16 1318    Clinical Impression Statement Patient presents with acute Rt sided LBP with no known cause. He has poor posture and alignment; significant limitations in bilat LE and spinal flexibility and motioin; decresed LE strength; muscular tightness through bilat lumbar paraspinasl and Rt QL/lats; pain with standing/walking/functional activities.    Rehab Potential Good   PT Frequency 2x / week   PT Duration 6 weeks   PT Treatment/Interventions Patient/family education;ADLs/Self Care Home Management;Cryotherapy;Electrical Stimulation;Iontophoresis 4mg /ml Dexamethasone;Moist Heat;Traction;Ultrasound;Neuromuscular re-education;Dry needling;Manual techniques;Therapeutic activities;Therapeutic exercise   PT  Next Visit Plan continue manual work; possibly add trial of [place] to Rt LB; work on flexibility and strength of LE's; core stabilization; modalities as indicated. (Pt not interested in TDN at this time)   Consulted and Agree with Plan of Care Patient      Patient will benefit from skilled therapeutic intervention in order to improve the following deficits and impairments:  Postural dysfunction, Improper body mechanics, Pain, Decreased range of motion, Decreased mobility, Decreased strength, Increased muscle spasms, Increased fascial restricitons, Decreased activity tolerance  Visit Diagnosis: Acute right-sided low back pain without sciatica - Plan: PT PLAN OF CARE CERT/RE-CERT  Other symptoms and signs involving the musculoskeletal system - Plan: PT PLAN OF CARE CERT/RE-CERT  Weakness generalized - Plan: PT PLAN OF CARE CERT/RE-CERT     Problem List Patient Active Problem List   Diagnosis Date Noted  . History of seizure 07/09/2016  . Low back pain 07/02/2016  . Primary open angle glaucoma 03/19/2016  . Carotid stenosis 03/12/2016  . Pulmonary nodules 06/08/2015  . Gout 04/18/2015  . Skin cancer 07/22/2014  . Nodular basal cell carcinoma 06/10/2014  . Squamous cell carcinoma in situ 06/10/2014  . Renal artery stenosis ([place]) 02/22/2014  . Chronic pancreatitis ([place]) 02/22/2014  . 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 Primary open angle glaucoma of both eyes 02/22/2014  . History of basal cell carcinoma 02/22/2014  .  CAD (coronary artery disease) 01/04/2014  . Essential hypertension, benign 01/04/2014  . Hyperlipidemia 01/04/2014  . Insomnia 01/04/2014  . Carotid artery stenosis 01/04/2014  . Epilepsy ([place]) 01/04/2014    Raymundo Rout Nilda Simmer PT, MPH  07/11/2016, 1:32 PM  [facility] [place], [place], [address] Phone: [phone]   Fax:  [phone]  Name: Ramari Reome MRN: [id number] Date of Birth:  1926-10-08

## 2016-07-16 ENCOUNTER — Other Ambulatory Visit: Payer: Self-pay | Admitting: Physician Assistant

## 2016-07-19 ENCOUNTER — Ambulatory Visit (INDEPENDENT_AMBULATORY_CARE_PROVIDER_SITE_OTHER): Payer: Medicare HMO | Admitting: Physical Therapy

## 2016-07-19 DIAGNOSIS — R29898 Other symptoms and signs involving the musculoskeletal system: Secondary | ICD-10-CM | POA: Diagnosis not present

## 2016-07-19 DIAGNOSIS — M545 Low back pain, unspecified: Secondary | ICD-10-CM

## 2016-07-19 DIAGNOSIS — R531 Weakness: Secondary | ICD-10-CM

## 2016-07-19 NOTE — Therapy (Signed)
[place], [place], [address] Phone: [phone]   Fax:  [phone]  Physical Therapy Treatment  Patient Details  Name: Brad James MRN: [id number] Date of Birth: Jan 27, 1927 Referring Provider: Dr Dianah Field  Encounter Date: 07/19/2016      PT End of Session - 07/19/16 1408    Visit Number 2   Number of Visits 12   Date for PT Re-Evaluation 08/22/16   PT Start Time 1407   PT Stop Time 1452   PT Time Calculation (min) 45 min   Activity Tolerance Patient tolerated treatment well   Behavior During Therapy [facility] for tasks assessed/performed      Past Medical History:  Diagnosis Date  . CAD (coronary artery disease) 01/04/2014  . Carotid artery stenosis 01/04/2014  . Dizzy spells    At times per pt  . Essential hypertension, benign 01/04/2014  . Glaucoma    Bilateral  . Hyperlipidemia 01/04/2014  . Hypertension   . Insomnia 01/04/2014    Past Surgical History:  Procedure Laterality Date  . COLONOSCOPY    . CORONARY ANGIOPLASTY WITH STENT PLACEMENT     stated 3-4 over past 10 yrs.   [name] ENDARTERECTOMY Left 04/18/2016   Procedure: LEFT CAROTID ENDARTERECTOMY;  Surgeon: Serafina Mitchell, MD;  Location: [facility];  Service: Vascular;  Laterality: Left;  [name] MELANOMA EXCISION    . PATCH ANGIOPLASTY Left 04/18/2016   Procedure: PATCH ANGIOPLASTY USING Rueben Bash BIOLOGIC PATCH;  Surgeon: Serafina Mitchell, MD;  Location: [facility];  Service: Vascular;  Laterality: Left;    There were no vitals filed for this visit.      Subjective Assessment - 07/19/16 1409    Subjective (P)  Back started getting better, not really sure why. Today it is worse again but still better than before.    Pain Score (P)  3    Pain Location (P)  Back   Pain Orientation (P)  Right;Lower   Pain Descriptors / Indicators (P)  --  just painful   Pain Type (P)  Acute pain   Pain Radiating Towards (P)  toward lateral hip but not down leg    Pain Onset (P)  1 to 4 weeks ago   Pain Frequency (P)  Intermittent   Aggravating Factors  (P)  moving around   Pain Relieving Factors (P)  rest, sitting down                         [facility] Adult PT Treatment/Exercise - 07/19/16 0001      Lumbar Exercises: Stretches   Single Knee to Chest Stretch 5 reps;10 seconds   Single Knee to Chest Stretch Limitations range per pt comfort   Lower Trunk Rotation 5 reps;10 seconds   Lower Trunk Rotation Limitations range per pt comfort     Electrical Stimulation   Electrical Stimulation Parameters pt declined     Ultrasound   Ultrasound Location Rt lateral lumbar region   Ultrasound Parameters 1.5 w/cm2, 100%, 1MHz X9 min   Ultrasound Goals Pain     Manual Therapy   Manual Therapy Soft tissue mobilization   Manual therapy comments prone   Soft tissue mobilization Rt lumbar paraspinal musculature, TPR as needed.                 PT Education - 07/19/16 1458    Education provided Yes   Education Details HEP, gentle progression with walking and HEP  Person(s) Educated Patient   Methods Explanation;Demonstration;Tactile cues;Verbal cues;Handout   Comprehension Verbalized understanding;Returned demonstration             PT Long Term Goals - 07/19/16 1505      PT LONG TERM GOAL #1   Title Improve flexibility and tissue extensibilty through LE's and trunk with patient to demonstrate increased hamstring flexibility to 75 deg bilat 08/22/16   Time 6   Period Weeks   Status On-going     PT LONG TERM GOAL #2   Title Increased bilat LE strength to 5-/5 to 5/5 08/22/16   Time 6   Period Weeks   Status On-going     PT LONG TERM GOAL #3   Title Increase functional activity tolerance allowing patient to ambulate 20-30 min with minimal to no pain 08/22/16   Time 6   Period Weeks   Status On-going     PT LONG TERM GOAL #4   Title Independent in HEP 08/22/16   Time 6   Period Weeks   Status On-going     PT  LONG TERM GOAL #5   Title Improve FOTO to </= 42% limitation 08/22/16   Time 6   Period Weeks   Status On-going               Plan - 07/19/16 1500    Clinical Impression Statement Pt reports that he was doing much better but awoke with more pain today. Pt still unaware of the cause of his pain. Upon presentation today the pt has significant increased muscle tension and decreased soft tissue mobility through the lumbar paraspinal musculature. Additional time spent to reinforce HEP and technique.    Rehab Potential Good   PT Frequency 2x / week   PT Duration 6 weeks   PT Treatment/Interventions Patient/family education;ADLs/Self Care Home Management;Cryotherapy;Electrical Stimulation;Iontophoresis 4mg /ml Dexamethasone;Moist Heat;Traction;Ultrasound;Neuromuscular re-education;Dry needling;Manual techniques;Therapeutic activities;Therapeutic exercise   PT Next Visit Plan Assess success of [place], manual therapy and HEP. [place] HEP with core stabilization exercises (pelvic tilt), stretches as needed.    Consulted and Agree with Plan of Care Patient      Patient will benefit from skilled therapeutic intervention in order to improve the following deficits and impairments:  Postural dysfunction, Improper body mechanics, Pain, Decreased range of motion, Decreased mobility, Decreased strength, Increased muscle spasms, Increased fascial restricitons, Decreased activity tolerance  Visit Diagnosis: Acute right-sided low back pain without sciatica  Other symptoms and signs involving the musculoskeletal system  Weakness generalized     Problem List Patient Active Problem List   Diagnosis Date Noted  . History of seizure 07/09/2016  . Low back pain 07/02/2016  . Primary open angle glaucoma 03/19/2016  . Carotid stenosis 03/12/2016  . Pulmonary nodules 06/08/2015  . Gout 04/18/2015  . Skin cancer 07/22/2014  . Nodular basal cell carcinoma 06/10/2014  . Squamous cell carcinoma in situ  06/10/2014  . Renal artery stenosis ([place]) 02/22/2014  . Chronic pancreatitis ([place]) 02/22/2014  . SMA stenosis ([place]) 02/22/2014  . Primary open angle glaucoma of both eyes 02/22/2014  . History of basal cell carcinoma 02/22/2014  . CAD (coronary artery disease) 01/04/2014  . Essential hypertension, benign 01/04/2014  . Hyperlipidemia 01/04/2014  . Insomnia 01/04/2014  . Carotid artery stenosis 01/04/2014  . Epilepsy ([place]) 01/04/2014    Linard Millers, PT, CSCS 07/19/2016, 3:08 PM  [facility] [place], [place], [address] Phone: [phone]   Fax:  [phone]  Name:  Brad James MRN: [id number] Date of Birth: 10/19/1926

## 2016-07-23 ENCOUNTER — Encounter: Payer: Medicare HMO | Admitting: Physical Therapy

## 2016-07-26 ENCOUNTER — Encounter: Payer: Self-pay | Admitting: Physical Therapy

## 2016-07-26 ENCOUNTER — Ambulatory Visit (INDEPENDENT_AMBULATORY_CARE_PROVIDER_SITE_OTHER): Payer: Medicare HMO | Admitting: Physical Therapy

## 2016-07-26 DIAGNOSIS — M545 Low back pain, unspecified: Secondary | ICD-10-CM

## 2016-07-26 DIAGNOSIS — R531 Weakness: Secondary | ICD-10-CM

## 2016-07-26 DIAGNOSIS — R29898 Other symptoms and signs involving the musculoskeletal system: Secondary | ICD-10-CM | POA: Diagnosis not present

## 2016-07-26 NOTE — Therapy (Addendum)
[place], [place], [address] Phone: [phone]   Fax:  [phone]  Physical Therapy Treatment  Patient Details  Name: Brad James MRN: [id number] Date of Birth: [birth year] Referring Provider: Dr Dianah Field  Encounter Date: 07/26/2016      PT End of Session - 07/26/16 1500    Visit Number 3   Number of Visits 12   Date for PT Re-Evaluation 08/22/16   PT Start Time 2725   PT Stop Time 1448   PT Time Calculation (min) 43 min   Activity Tolerance Patient tolerated treatment well   Behavior During Therapy [facility] for tasks assessed/performed      Past Medical History:  Diagnosis Date  . CAD (coronary artery disease) 01/04/2014  . Carotid artery stenosis 01/04/2014  . Dizzy spells    At times per pt  . Essential hypertension, benign 01/04/2014  . Glaucoma    Bilateral  . Hyperlipidemia 01/04/2014  . Hypertension   . Insomnia 01/04/2014    Past Surgical History:  Procedure Laterality Date  . COLONOSCOPY    . CORONARY ANGIOPLASTY WITH STENT PLACEMENT     stated 3-4 over past 10 yrs.   [name] ENDARTERECTOMY Left 04/18/2016   Procedure: LEFT CAROTID ENDARTERECTOMY;  Surgeon: Serafina Mitchell, MD;  Location: [place];  Service: Vascular;  Laterality: Left;  [name] MELANOMA EXCISION    . PATCH ANGIOPLASTY Left 04/18/2016   Procedure: PATCH ANGIOPLASTY USING Rueben Bash BIOLOGIC PATCH;  Surgeon: Serafina Mitchell, MD;  Location: [facility];  Service: Vascular;  Laterality: Left;    There were no vitals filed for this visit.      Subjective Assessment - 07/26/16 1407    Subjective Doing better now, was sore initially with exercises but good now.    Currently in Pain? Yes   Pain Score 1    Pain Location Back   Pain Orientation Left;Right   Pain Descriptors / Indicators Dull   Pain Type Acute pain   Aggravating Factors  not sure really, sometimes getting out of a chair.    Pain Relieving Factors tylenol                          [facility] Adult PT Treatment/Exercise - 07/26/16 0001      Lumbar Exercises: Stretches   Passive Hamstring Stretch 3 reps;30 seconds   Single Knee to Chest Stretch 10 seconds;3 reps   Single Knee to Chest Stretch Limitations range per pt comfort   Lower Trunk Rotation 10 seconds;3 reps   Lower Trunk Rotation Limitations range per pt comfort     Lumbar Exercises: Aerobic   Stationary Bike Nu-step L3 X5 min.      Lumbar Exercises: Standing   Other Standing Lumbar Exercises seated trunk flexion stretch 3X20 seconds   Other Standing Lumbar Exercises seated trunk flexion with diagonal lean to Lt knee 3X20 seconds     Lumbar Exercises: Supine   Ab Set 10 reps;5 seconds     Manual Therapy   Manual Therapy Soft tissue mobilization   Manual therapy comments prone   Soft tissue mobilization gentle Rt lumbar paraspinal musculature, TPR as needed.                 PT Education - 07/26/16 1459    Education provided Yes   Education Details HEP progression   Person(s) Educated Patient   Methods Explanation;Demonstration;Verbal cues;Tactile cues;Handout   Comprehension Verbalized understanding;Returned demonstration  PT Long Term Goals - 07/26/16 1509      PT LONG TERM GOAL #1   Title Improve flexibility and tissue extensibilty through LE's and trunk with patient to demonstrate increased hamstring flexibility to 75 deg bilat 08/22/16   Time 6   Period Weeks   Status On-going     PT LONG TERM GOAL #2   Title Increased bilat LE strength to 5-/5 to 5/5 08/22/16   Period Weeks   Status On-going     PT LONG TERM GOAL #3   Title Increase functional activity tolerance allowing patient to ambulate 20-30 min with minimal to no pain 08/22/16   Time 6   Period Weeks   Status On-going     PT LONG TERM GOAL #4   Title Independent in HEP 08/22/16   Time 6   Period Weeks   Status On-going     PT LONG TERM GOAL #5   Title Improve  FOTO to </= 42% limitation 08/22/16   Time 6   Period Weeks   Status On-going               Plan - 07/26/16 1500    Clinical Impression Statement Pt reporting that he is doing much better but still sore. Wanting to go play golf soon. Pt having increased muscle tension through Rt lumbar paraspinals/quadratus region. Working on progressing stretching program. Anticipate introducing more strengthening when tolerable.    Rehab Potential Good   PT Frequency 2x / week   PT Duration 6 weeks   PT Treatment/Interventions Patient/family education;ADLs/Self Care Home Management;Cryotherapy;Electrical Stimulation;Iontophoresis '4mg'$ /ml Dexamethasone;Moist Heat;Traction;Ultrasound;Neuromuscular re-education;Dry needling;Manual techniques;Therapeutic activities;Therapeutic exercise   PT Next Visit Plan Begin strengthening with focus on core/lumbar paraspinal musculature. Modalities/manual techniques as needed.    Consulted and Agree with Plan of Care Patient      Patient will benefit from skilled therapeutic intervention in order to improve the following deficits and impairments:  Postural dysfunction, Improper body mechanics, Pain, Decreased range of motion, Decreased mobility, Decreased strength, Increased muscle spasms, Increased fascial restricitons, Decreased activity tolerance  Visit Diagnosis: Acute right-sided low back pain without sciatica  Other symptoms and signs involving the musculoskeletal system  Weakness generalized     Problem List Patient Active Problem List   Diagnosis Date Noted  . History of seizure 07/09/2016  . Low back pain 07/02/2016  . Primary open angle glaucoma 03/19/2016  . Carotid stenosis 03/12/2016  . Pulmonary nodules 06/08/2015  . Gout 04/18/2015  . Skin cancer 07/22/2014  . Nodular basal cell carcinoma 06/10/2014  . Squamous cell carcinoma in situ 06/10/2014  . Renal artery stenosis ([place]) 02/22/2014  . Chronic pancreatitis ([place]) 02/22/2014  . SMA  stenosis ([place]) 02/22/2014  . Primary open angle glaucoma of both eyes 02/22/2014  . History of basal cell carcinoma 02/22/2014  . CAD (coronary artery disease) 01/04/2014  . Essential hypertension, benign 01/04/2014  . Hyperlipidemia 01/04/2014  . Insomnia 01/04/2014  . Carotid artery stenosis 01/04/2014  . Epilepsy ([place]) 01/04/2014    Cassell Clement, PT, CSCS  07/26/2016, 3:17 PM  [facility] [place], [place], [address] Phone: [phone]   Fax:  [phone]  Name: Brad James MRN: [id number] Date of Birth: [birth year]   PHYSICAL THERAPY DISCHARGE SUMMARY  Visits from Start of Care: 3  Current functional level related to goals / functional outcomes: See progress note for discharge status   Remaining deficits: See note    Education / Equipment:  HEP Plan: Patient agrees to discharge.  Patient goals were not met. Patient is being discharged due to not returning since the last visit.  ?????     Celyn P. Helene Kelp PT, MPH 08/27/16 7:55 AM

## 2016-07-30 ENCOUNTER — Other Ambulatory Visit: Payer: Self-pay | Admitting: Osteopathic Medicine

## 2016-07-30 ENCOUNTER — Other Ambulatory Visit: Payer: Self-pay

## 2016-07-30 DIAGNOSIS — G8929 Other chronic pain: Secondary | ICD-10-CM

## 2016-07-30 DIAGNOSIS — M545 Low back pain, unspecified: Secondary | ICD-10-CM

## 2016-07-30 MED ORDER — NAPROXEN 500 MG PO TABS: 500.0000 mg | ORAL_TABLET | Freq: Two times a day (BID) | ORAL | 3 refills | Status: DC

## 2016-07-30 MED ORDER — ATORVASTATIN CALCIUM 80 MG PO TABS
80.0000 mg | ORAL_TABLET | Freq: Every day | ORAL | 2 refills | Status: DC
Start: 2016-07-30 — End: 2017-06-05

## 2016-07-30 MED ORDER — METOPROLOL SUCCINATE ER 100 MG PO TB24: 100.0000 mg | ORAL_TABLET | Freq: Every day | ORAL | 3 refills | Status: DC

## 2016-07-30 MED ORDER — OMEPRAZOLE 20 MG PO CPDR: 20.0000 mg | DELAYED_RELEASE_CAPSULE | Freq: Every day | ORAL | 3 refills | Status: DC

## 2016-07-30 MED ORDER — LISINOPRIL 10 MG PO TABS: 10.0000 mg | ORAL_TABLET | Freq: Every day | ORAL | 2 refills | Status: DC

## 2016-07-30 NOTE — Telephone Encounter (Signed)
Spoke with Pt, he no longer takes the Meloxicam. Will remove from Rx list. Approved Rx refills sent in.   Pt does state he was recently changed from Ambien 10mg  down to 5mg . Pt reports he "hasn't been sleeping since." Pt states he has been taking 2 of his 5mg  tabs to equal 10mg  at home and that does allow him to sleep. Will route to PCP for review if Rx can be changed back to original dosing.

## 2016-07-30 NOTE — Telephone Encounter (Signed)
90 days supply is fine for omeprazole, metoprolol. Patient should not be taking naproxen and Mobic together, can see which one he would like to continue and 90 day supply of that should be fine. Refills for Ambien and Phenobarbital - can do 30 day supplies of these medicines with 2 refills

## 2016-07-31 MED ORDER — ZOLPIDEM TARTRATE 10 MG PO TABS: 10.0000 mg | ORAL_TABLET | Freq: Every evening | ORAL | 3 refills | Status: DC | PRN

## 2016-07-31 NOTE — Telephone Encounter (Signed)
Pt advised of new Rx. States he does have some 5mg  tabs of Ambien left at home, will take 2 tabs until Rx is out then pick up new 10mg  Ambien Rx. No further questions.

## 2016-07-31 NOTE — Telephone Encounter (Signed)
Okay to refill Ambien at higher dose. Patient and I have discussed risks versus benefits of this medication.

## 2016-08-01 ENCOUNTER — Telehealth: Payer: Self-pay

## 2016-08-01 ENCOUNTER — Encounter: Payer: Medicare HMO | Admitting: Physical Therapy

## 2016-08-01 NOTE — Telephone Encounter (Signed)
Phone call returned to pt. After message rec'd. On Triage line re: sore/ scabs on left neck incision.  Son, Brad James answered the phone.  Stated his father is not at home.  Advised to have the pt. Call me.  Son stated "I know what he was calling for.  Reported he has a skin condition and needs to see a Dermatologist.  Reported he has multiple areas that the skin look irritated.  Denied any disruption of the left neck incision.   Son stated he told him not to call the vascular surgeon, but to call a dermatologist.  Advised son, that if the problem is widespread, and not specifically on left neck incision, then possibly he could have his PCP evaluate, and determine need for Dermatology.  Son verb. Understanding.  Stated he would discuss with his father.  Encouraged to have pt. Call office if needs to discuss further.

## 2016-08-03 ENCOUNTER — Encounter: Payer: Self-pay | Admitting: Cardiovascular Disease

## 2016-08-07 ENCOUNTER — Ambulatory Visit (INDEPENDENT_AMBULATORY_CARE_PROVIDER_SITE_OTHER): Payer: Medicare HMO | Admitting: Osteopathic Medicine

## 2016-08-07 ENCOUNTER — Encounter: Payer: Self-pay | Admitting: Osteopathic Medicine

## 2016-08-07 VITALS — Ht 72.0 in | Wt 182.0 lb

## 2016-08-07 DIAGNOSIS — I10 Essential (primary) hypertension: Secondary | ICD-10-CM | POA: Diagnosis not present

## 2016-08-07 DIAGNOSIS — D492 Neoplasm of unspecified behavior of bone, soft tissue, and skin: Secondary | ICD-10-CM | POA: Diagnosis not present

## 2016-08-07 DIAGNOSIS — R030 Elevated blood-pressure reading, without diagnosis of hypertension: Secondary | ICD-10-CM

## 2016-08-07 NOTE — Progress Notes (Signed)
HPI: Brad James is a 82 y.o. male  who presents to [facility] today, 08/07/16,  for chief complaint of:  Chief Complaint  Patient presents with  . skin concern   . Context/Quality: Underwent carotid endarterectomy 5 months ago, occasionally scar will still have some drainage/scabbing, there is a small area adjacent to the scar which has also been causing him some problems with irritating growth/drainage. It's not painful. Patient states that he called his surgeon but was told that he couldn't be seen anytime soon and to come to PCP for further evaluation. . Location: Left neck, concerning growth is adjacent to scar . Duration: Since the surgery 5 months ago   Past medical, surgical, social and family history reviewed: Patient Active Problem List   Diagnosis Date Noted  . History of seizure 07/09/2016  . Low back pain 07/02/2016  . Primary open angle glaucoma 03/19/2016  . Carotid stenosis 03/12/2016  . Pulmonary nodules 06/08/2015  . Gout 04/18/2015  . Skin cancer 07/22/2014  . Nodular basal cell carcinoma 06/10/2014  . Squamous cell carcinoma in situ 06/10/2014  . Renal artery stenosis ([place]) 02/22/2014  . Chronic pancreatitis ([place]) 02/22/2014  . 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 Primary open angle glaucoma of both eyes 02/22/2014  . History of basal cell carcinoma 02/22/2014  . CAD (coronary artery disease) 01/04/2014  . Essential hypertension, benign 01/04/2014  . Hyperlipidemia 01/04/2014  . Insomnia 01/04/2014  . Carotid artery stenosis 01/04/2014  . Epilepsy ([place]) 01/04/2014   Past Surgical History:  Procedure Laterality Date  . COLONOSCOPY    . CORONARY ANGIOPLASTY WITH STENT PLACEMENT     stated 3-4 over past 10 yrs.   [name] ENDARTERECTOMY Left 04/18/2016   Procedure: LEFT CAROTID ENDARTERECTOMY;  Surgeon: Serafina Mitchell, MD;  Location: [facility];  Service: Vascular;  Laterality: Left;  [name] MELANOMA EXCISION    . PATCH ANGIOPLASTY Left  04/18/2016   Procedure: PATCH ANGIOPLASTY USING Rueben Bash BIOLOGIC PATCH;  Surgeon: Serafina Mitchell, MD;  Location: [facility] OR;  Service: Vascular;  Laterality: Left;   Social History  Substance Use Topics  . Smoking status: Former Smoker    Quit date: 01/04/1969  . Smokeless tobacco: Never Used  . Alcohol use No   Family History  Problem Relation Age of Onset  . Heart disease Mother   . Cancer Father 56    oral  . Stroke Brother 77     Current medication list and allergy/intolerance information reviewed:   Current Outpatient Prescriptions on File Prior to Visit  Medication Sig Dispense Refill  . aspirin EC 325 MG tablet Take 1 tablet (325 mg total) by mouth daily. 365 tablet 0  . atorvastatin (LIPITOR) 80 MG tablet Take 1 tablet (80 mg total) by mouth daily. F/u labs in March 2017 90 tablet 2  . brinzolamide (AZOPT) 1 % ophthalmic suspension Place 1 drop into both eyes 2 (two) times daily. 10 mL 1  . ferrous sulfate 325 (65 FE) MG EC tablet Take 325 mg by mouth daily with breakfast.    . lisinopril (PRINIVIL,ZESTRIL) 10 MG tablet Take 1 tablet (10 mg total) by mouth daily. 90 tablet 2  . metoprolol succinate (TOPROL-XL) 100 MG 24 hr tablet Take 1 tablet (100 mg total) by mouth daily. Take with or immediately following a meal. 90 tablet 3  . Multiple Vitamin (MULTIVITAMIN) tablet Take 1 tablet by mouth daily.    . naproxen (NAPROSYN) 500 MG tablet Take 1 tablet (  500 mg total) by mouth 2 (two) times daily with a meal. As needed for pain 180 tablet 3  . omeprazole (PRILOSEC) 20 MG capsule Take 1 capsule (20 mg total) by mouth daily. 90 capsule 3  . oxyCODONE-acetaminophen (PERCOCET/ROXICET) 5-325 MG tablet Take 1 tablet by mouth every 6 (six) hours as needed for moderate pain. 6 tablet 0  . PHENobarbital (LUMINAL) 32.4 MG tablet Take 2 tablets (64.8 mg total) by mouth at bedtime. 60 tablet 5  . predniSONE (DELTASONE) 50 MG tablet One tab PO daily for 5 days. 5 tablet 0  . Probiotic Product  (PROBIOTIC PO) Take 1 tablet by mouth daily.    [name] triamcinolone ointment (KENALOG) 0.5 % Apply 1 application topically 2 (two) times daily. To affected area/rash, avoid eyes and face 30 g 1  . zolpidem (AMBIEN) 10 MG tablet Take 1 tablet (10 mg total) by mouth at bedtime as needed for sleep. 30 tablet 3   No current facility-administered medications on file prior to visit.    No Known Allergies    Review of Systems:  Constitutional: No recent illness  HEENT: No  headache, no vision change  Cardiac: No  chest pain, No  pressure, No palpitations  Respiratory:  No  shortness of breath.   Skin: +concerning skin lesions as per HPI  Hem/Onc: No  easy bruising/bleeding,   Exam:  Ht 6' (1.829 m)   Wt 182 lb (82.6 kg)   BMI 24.68 kg/m   Constitutional: VS see above. General Appearance: alert, well-developed, well-nourished, NAD  Neck: No masses, trachea midline.   Respiratory: Normal respiratory effort.   Musculoskeletal: Gait normal. Symmetric and independent movement of all extremities  Neurological: Normal balance/coordination. No tremor.  Skin: warm, dry, intact. Scar from carotid endarterectomy appears to be healing well but adjacent lesion is concerning for possible basal cell versus other malignancy, outside chance of hyperplastic scar tissue, difficult to see if there is actual scar in this area but it seems a bit too far away from the healing carotid endarterectomy scar. See photograph below.  Psychiatric: Normal judgment/insight. Normal mood and affect. Oriented x3.          ASSESSMENT/PLAN:   Abnormal skin growth - Concern for possible malignancy versus hyperplastic scar tissue.Other skin cancer history is concerning as well. Patient's wife is currently in hospice, he is not particularly motivated to have urgent referral to dermatology but I stressed to the patient that we could certainly be risking a missed diagnosis if he waits too long. Urgent referral placed  to at least get the ball rolling on this, I advised patient I was not too comfortable doing a biopsy in this area so close to his scar and other sensitive neck anatomy without second opinion on possible etiology of this lesion.    Visit summary with medication list and pertinent instructions was printed for patient to review. All questions at time of visit were answered - patient instructed to contact office with any additional concerns. ER/RTC precautions were reviewed with the patient. Follow-up plan: Return in about 4 weeks (around 09/04/2016) for recheck, sooner if no better/change/worse.

## 2016-08-14 ENCOUNTER — Ambulatory Visit: Payer: Medicare HMO | Admitting: Cardiovascular Disease

## 2016-09-14 ENCOUNTER — Telehealth: Payer: Self-pay | Admitting: *Deleted

## 2016-09-14 NOTE — Telephone Encounter (Signed)
I message Cecilie Kicks NP, to see if this monitor order could be cancel. See below.   06/29/2016 pt can not schedule at this time he does not want [place] to keep calling his wife is sick and he is taking care of the wife. stpegram  05/24/2016 pt is taking care of his wife and will call us back once the home healthcare is finished. stpegram

## 2016-11-12 ENCOUNTER — Encounter (HOSPITAL_COMMUNITY): Payer: Medicare HMO

## 2016-11-12 ENCOUNTER — Ambulatory Visit: Payer: Medicare HMO | Admitting: Surgery

## 2016-11-12 DIAGNOSIS — Z85828 Personal history of other malignant neoplasm of skin: Secondary | ICD-10-CM | POA: Diagnosis not present

## 2016-11-12 DIAGNOSIS — C44311 Basal cell carcinoma of skin of nose: Secondary | ICD-10-CM | POA: Diagnosis not present

## 2016-11-12 DIAGNOSIS — L821 Other seborrheic keratosis: Secondary | ICD-10-CM | POA: Diagnosis not present

## 2016-11-12 DIAGNOSIS — C44719 Basal cell carcinoma of skin of left lower limb, including hip: Secondary | ICD-10-CM | POA: Diagnosis not present

## 2016-11-12 DIAGNOSIS — Z08 Encounter for follow-up examination after completed treatment for malignant neoplasm: Secondary | ICD-10-CM | POA: Diagnosis not present

## 2016-11-19 ENCOUNTER — Other Ambulatory Visit: Payer: Self-pay | Admitting: Osteopathic Medicine

## 2016-12-10 ENCOUNTER — Encounter (HOSPITAL_COMMUNITY): Payer: Medicare HMO

## 2016-12-10 ENCOUNTER — Ambulatory Visit: Payer: Medicare HMO | Admitting: Surgery

## 2016-12-26 DIAGNOSIS — D0339 Melanoma in situ of other parts of face: Secondary | ICD-10-CM | POA: Diagnosis not present

## 2016-12-26 DIAGNOSIS — C4441 Basal cell carcinoma of skin of scalp and neck: Secondary | ICD-10-CM | POA: Diagnosis not present

## 2016-12-26 DIAGNOSIS — D485 Neoplasm of uncertain behavior of skin: Secondary | ICD-10-CM | POA: Diagnosis not present

## 2016-12-26 DIAGNOSIS — C44719 Basal cell carcinoma of skin of left lower limb, including hip: Secondary | ICD-10-CM | POA: Diagnosis not present

## 2016-12-26 DIAGNOSIS — L821 Other seborrheic keratosis: Secondary | ICD-10-CM | POA: Diagnosis not present

## 2016-12-27 ENCOUNTER — Encounter: Payer: Self-pay | Admitting: Surgery

## 2016-12-31 DIAGNOSIS — R079 Chest pain, unspecified: Secondary | ICD-10-CM | POA: Diagnosis not present

## 2016-12-31 DIAGNOSIS — I259 Chronic ischemic heart disease, unspecified: Secondary | ICD-10-CM | POA: Diagnosis not present

## 2016-12-31 DIAGNOSIS — Z955 Presence of coronary angioplasty implant and graft: Secondary | ICD-10-CM | POA: Diagnosis not present

## 2016-12-31 DIAGNOSIS — Z7982 Long term (current) use of aspirin: Secondary | ICD-10-CM | POA: Diagnosis not present

## 2016-12-31 DIAGNOSIS — I2510 Atherosclerotic heart disease of native coronary artery without angina pectoris: Secondary | ICD-10-CM | POA: Diagnosis not present

## 2016-12-31 DIAGNOSIS — I25119 Atherosclerotic heart disease of native coronary artery with unspecified angina pectoris: Secondary | ICD-10-CM | POA: Diagnosis not present

## 2016-12-31 DIAGNOSIS — I10 Essential (primary) hypertension: Secondary | ICD-10-CM | POA: Diagnosis not present

## 2016-12-31 DIAGNOSIS — Z7901 Long term (current) use of anticoagulants: Secondary | ICD-10-CM | POA: Diagnosis not present

## 2016-12-31 DIAGNOSIS — Z87891 Personal history of nicotine dependence: Secondary | ICD-10-CM | POA: Diagnosis not present

## 2016-12-31 DIAGNOSIS — I160 Hypertensive urgency: Secondary | ICD-10-CM | POA: Diagnosis not present

## 2016-12-31 DIAGNOSIS — Z79899 Other long term (current) drug therapy: Secondary | ICD-10-CM | POA: Diagnosis not present

## 2016-12-31 DIAGNOSIS — R600 Localized edema: Secondary | ICD-10-CM | POA: Diagnosis not present

## 2016-12-31 DIAGNOSIS — Z720 Tobacco use: Secondary | ICD-10-CM | POA: Diagnosis not present

## 2016-12-31 DIAGNOSIS — I739 Peripheral vascular disease, unspecified: Secondary | ICD-10-CM | POA: Diagnosis not present

## 2017-01-01 DIAGNOSIS — I160 Hypertensive urgency: Secondary | ICD-10-CM | POA: Diagnosis not present

## 2017-01-01 DIAGNOSIS — I2510 Atherosclerotic heart disease of native coronary artery without angina pectoris: Secondary | ICD-10-CM | POA: Diagnosis not present

## 2017-01-01 DIAGNOSIS — Z955 Presence of coronary angioplasty implant and graft: Secondary | ICD-10-CM | POA: Diagnosis not present

## 2017-01-01 DIAGNOSIS — I739 Peripheral vascular disease, unspecified: Secondary | ICD-10-CM | POA: Diagnosis not present

## 2017-01-01 DIAGNOSIS — R072 Precordial pain: Secondary | ICD-10-CM | POA: Diagnosis not present

## 2017-01-01 DIAGNOSIS — R079 Chest pain, unspecified: Secondary | ICD-10-CM | POA: Diagnosis not present

## 2017-01-02 DIAGNOSIS — R072 Precordial pain: Secondary | ICD-10-CM | POA: Diagnosis not present

## 2017-01-02 DIAGNOSIS — I259 Chronic ischemic heart disease, unspecified: Secondary | ICD-10-CM | POA: Diagnosis not present

## 2017-01-02 DIAGNOSIS — R079 Chest pain, unspecified: Secondary | ICD-10-CM | POA: Diagnosis not present

## 2017-01-02 DIAGNOSIS — I160 Hypertensive urgency: Secondary | ICD-10-CM | POA: Diagnosis not present

## 2017-01-02 DIAGNOSIS — I739 Peripheral vascular disease, unspecified: Secondary | ICD-10-CM | POA: Diagnosis not present

## 2017-01-02 DIAGNOSIS — L821 Other seborrheic keratosis: Secondary | ICD-10-CM | POA: Diagnosis not present

## 2017-01-02 DIAGNOSIS — I2510 Atherosclerotic heart disease of native coronary artery without angina pectoris: Secondary | ICD-10-CM | POA: Diagnosis not present

## 2017-01-02 DIAGNOSIS — Z955 Presence of coronary angioplasty implant and graft: Secondary | ICD-10-CM | POA: Diagnosis not present

## 2017-01-03 ENCOUNTER — Encounter: Payer: Self-pay | Admitting: Osteopathic Medicine

## 2017-01-03 ENCOUNTER — Ambulatory Visit (INDEPENDENT_AMBULATORY_CARE_PROVIDER_SITE_OTHER): Payer: Medicare HMO | Admitting: Osteopathic Medicine

## 2017-01-03 VITALS — BP 118/54 | HR 60 | Ht 72.0 in | Wt 183.0 lb

## 2017-01-03 DIAGNOSIS — F5101 Primary insomnia: Secondary | ICD-10-CM | POA: Diagnosis not present

## 2017-01-03 DIAGNOSIS — I10 Essential (primary) hypertension: Secondary | ICD-10-CM | POA: Diagnosis not present

## 2017-01-03 DIAGNOSIS — Z87898 Personal history of other specified conditions: Secondary | ICD-10-CM

## 2017-01-03 DIAGNOSIS — R69 Illness, unspecified: Secondary | ICD-10-CM | POA: Diagnosis not present

## 2017-01-03 NOTE — Patient Instructions (Addendum)
Plan: 1. Decrease Metoprolol from 200 to 100 mg per day 2. Decrease Ambien from 10 to 5 mg per day  3. See me next week to recheck BP - if still low, will cut the Lisinopril

## 2017-01-03 NOTE — Progress Notes (Signed)
HPI: Brad James is a 83 y.o. male  who presents to [facility] today, 01/03/17,  for chief complaint of:  Chief Complaint  Patient presents with  . Hospitalization Follow-up    BLOOD PRESSURE     Admit to the hospital 12/31/2016, discharge 01/02/2017. Hypertensive urgency. Went to ER with intermittent chest pain 3 days. Worse with activity, was better with nitroglycerin in the ER. Systolic blood pressure 782 at home on day of presentation, in ER initially was 248/112. Nuclear stress test 04/06/2016: No ST segment deviation, low risk nuclear study with prior inferior and apical infarct versus thinning, no ischemia. Troponins negative in the ER. Neuro cardiac enzymes were negative. A Cardiolite stress test in the hospital was negative for ischemia, showed EF of 73%. Blood pressure was 166/78 at time of discharge.  Amlodipine was added, lisinopril dose was increased, patient had lisinopril on his medication list prior to hospitalization however was not taking it. Also started amlodipine. Blood pressure today is a bit on the low side, systolic less than 60. He reports some fatigue/occasional lightheadedness. No chest pain, no SOB.  Insomnia: About the scene, patient states she would like to try to get off of the Ambien at some point in the near future. Would like to start cutting back on this.  History of seizure post TBI: Has been on barbiturates for about 50 years, is very anxious to come off of these medications, does not want to have another seizure.  Skin: Multiple abnormalities, currently following with dermatology. No new rash/ulcer.  Patient is accompanied by daughter who assists with history-taking.   Past medical, surgical, social and family history reviewed: Patient Active Problem List   Diagnosis Date Noted  . Abnormal skin growth 08/07/2016  . History of seizure 07/09/2016  . Low back pain 07/02/2016  . Primary open angle glaucoma  03/19/2016  . Carotid stenosis 03/12/2016  . Pulmonary nodules 06/08/2015  . Gout 04/18/2015  . Skin cancer 07/22/2014  . Nodular basal cell carcinoma 06/10/2014  . Squamous cell carcinoma in situ 06/10/2014  . Renal artery stenosis ([place]) 02/22/2014  . Chronic pancreatitis ([place]) 02/22/2014  . 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 Primary open angle glaucoma of both eyes 02/22/2014  . History of basal cell carcinoma 02/22/2014  . CAD (coronary artery disease) 01/04/2014  . Essential hypertension, benign 01/04/2014  . Hyperlipidemia 01/04/2014  . Insomnia 01/04/2014  . Carotid artery stenosis 01/04/2014  . Epilepsy ([place]) 01/04/2014   Past Surgical History:  Procedure Laterality Date  . COLONOSCOPY    . CORONARY ANGIOPLASTY WITH STENT PLACEMENT     stated 3-4 over past 10 yrs.   [name] ENDARTERECTOMY Left 04/18/2016   Procedure: LEFT CAROTID ENDARTERECTOMY;  Surgeon: Serafina Mitchell, MD;  Location: [facility];  Service: Vascular;  Laterality: Left;  [name] MELANOMA EXCISION    . PATCH ANGIOPLASTY Left 04/18/2016   Procedure: PATCH ANGIOPLASTY USING Rueben Bash BIOLOGIC PATCH;  Surgeon: Serafina Mitchell, MD;  Location: [facility] OR;  Service: Vascular;  Laterality: Left;   Social History  Substance Use Topics  . Smoking status: Former Smoker    Quit date: 01/04/1969  . Smokeless tobacco: Never Used  . Alcohol use No   Family History  Problem Relation Age of Onset  . Heart disease Mother   . Cancer Father 41    oral  . Stroke Brother 42     Current medication list and allergy/intolerance information reviewed:   Current Outpatient Prescriptions  Medication  Sig Dispense Refill  . aspirin EC 325 MG tablet Take 1 tablet (325 mg total) by mouth daily. 365 tablet 0  . atorvastatin (LIPITOR) 80 MG tablet Take 1 tablet (80 mg total) by mouth daily. F/u labs in March 2017 90 tablet 2  . brinzolamide (AZOPT) 1 % ophthalmic suspension Place 1 drop into both eyes 2 (two) times daily. 10 mL 1  . ferrous sulfate 325 (65  FE) MG EC tablet Take 325 mg by mouth daily with breakfast.    . lisinopril (PRINIVIL,ZESTRIL) 40 MG tablet Take 40 mg by mouth daily.    . metoprolol (TOPROL-XL) 200 MG 24 hr tablet Take 200 mg by mouth daily.    . Multiple Vitamin (MULTIVITAMIN) tablet Take 1 tablet by mouth daily.    . naproxen (NAPROSYN) 500 MG tablet Take 1 tablet (500 mg total) by mouth 2 (two) times daily with a meal. As needed for pain 180 tablet 3  . omeprazole (PRILOSEC) 20 MG capsule Take 1 capsule (20 mg total) by mouth daily. 90 capsule 3  . PHENobarbital (LUMINAL) 32.4 MG tablet Take 2 tablets (64.8 mg total) by mouth at bedtime. 60 tablet 5  . Probiotic Product (PROBIOTIC PO) Take 1 tablet by mouth daily.    [name] triamcinolone ointment (KENALOG) 0.5 % Apply 1 application topically 2 (two) times daily. To affected area/rash, avoid eyes and face 30 g 1  . zolpidem (AMBIEN) 10 MG tablet TAKE ONE TABLET BY MOUTH AT BEDTIME AS NEEDED FOR SLEEP 30 tablet 3   No current facility-administered medications for this visit.    No Known Allergies    Review of Systems:  Constitutional:  No  fever, no chills, No unintentional weight changes. No significant fatigue.   HEENT: No  headache, no vision change  Cardiac: No  chest pain, No  pressure, No palpitations, No  Orthopnea  Respiratory:  No  shortness of breath. No  Cough  Gastrointestinal: No  abdominal pain, No  nausea  Musculoskeletal: No new myalgia/arthralgia  Skin: No new Rash, No other new wounds/concerning lesions  Psychiatric: No  concerns with depression, No  concerns with anxiety, +sleep problems, No mood problems  Exam:  BP (!) 118/54   Pulse 60   Ht 6' (1.829 m)   Wt 183 lb (83 kg)   BMI 24.82 kg/m  verified with manual cuff.  Constitutional: VS see above. General Appearance: alert, well-developed, well-nourished, NAD  Ears, Nose, Mouth, Throat: MMM, Normal external inspection ears/nares/mouth/lips/gums.   Neck: No masses, trachea midline.    Respiratory: Normal respiratory effort. no wheeze, no rhonchi, no rales  Cardiovascular: S1/S2 normal, no murmur, no rub/gallop auscultated. RRR. No lower extremity edema.   Psychiatric: Normal judgment/insight. Normal mood and affect. Oriented x3.   No concerns on labs from hospitalization. Above for review of medical records.  ASSESSMENT/PLAN: The primary encounter diagnosis was Essential hypertension, benign. Diagnoses of Primary insomnia and History of seizure were also pertinent to this visit.  Blood pressure definitely lower than on his last visit but I'm concerned that given this diastolic below 60. Will adjust medications, reduce beta blocker first and plan to recheck within the next few days. For insomnia, would like to get him off of Ambien, consider non-sedative/hypnotic alternatives  Patient Instructions  Plan: 1. Decrease Metoprolol from 200 to 100 mg per day 2. Decrease Ambien from 10 to 5 mg per day  3. See me next week to recheck BP - if still low, will cut the Lisinopril  Visit summary with medication list and pertinent instructions was printed for patient to review. All questions at time of visit were answered - patient instructed to contact office with any additional concerns. ER/RTC precautions were reviewed with the patient. Follow-up plan: Return in about 4 days (around 01/07/2017) for BP RECHECK, sooner if needed .  Note: Total time spent 25 minutes, greater than 50% of the visit was spent face-to-face counseling and coordinating care for the following: The primary encounter diagnosis was Essential hypertension, benign. Diagnoses of Primary insomnia and History of seizure were also pertinent to this visit.[name]

## 2017-01-07 ENCOUNTER — Ambulatory Visit (INDEPENDENT_AMBULATORY_CARE_PROVIDER_SITE_OTHER): Payer: Medicare HMO | Admitting: Surgery

## 2017-01-07 ENCOUNTER — Ambulatory Visit (HOSPITAL_COMMUNITY)
Admission: RE | Admit: 2017-01-07 | Discharge: 2017-01-07 | Disposition: A | Discharge status: Home / Self Care | Payer: Medicare HMO | Source: Ambulatory Visit | Attending: Surgery | Admitting: Surgery

## 2017-01-07 ENCOUNTER — Encounter: Payer: Self-pay | Admitting: Surgery

## 2017-01-07 VITALS — BP 189/83 | HR 56 | Temp 97.0°F | Resp 20 | Ht 72.0 in | Wt 185.0 lb

## 2017-01-07 DIAGNOSIS — I6523 Occlusion and stenosis of bilateral carotid arteries: Secondary | ICD-10-CM

## 2017-01-07 DIAGNOSIS — Z9889 Other specified postprocedural states: Secondary | ICD-10-CM | POA: Diagnosis not present

## 2017-01-07 DIAGNOSIS — I6521 Occlusion and stenosis of right carotid artery: Secondary | ICD-10-CM | POA: Diagnosis not present

## 2017-01-07 LAB — VAS US CAROTID
LCCADDIAS: 25 cm/s
LCCAPDIAS: 18 cm/s
LCCAPSYS: 107 cm/s
LEFT ECA DIAS: 0 cm/s
LICADDIAS: -21 cm/s
LICADSYS: -75 cm/s
LICAPSYS: -114 cm/s
Left CCA dist sys: 194 cm/s
Left ICA prox dias: -24 cm/s
RCCADSYS: -106 cm/s
RIGHT CCA MID DIAS: 19 cm/s
RIGHT ECA DIAS: 0 cm/s
RIGHT VERTEBRAL DIAS: 4 cm/s
Right CCA prox dias: 17 cm/s
Right CCA prox sys: 113 cm/s

## 2017-01-07 NOTE — Progress Notes (Signed)
[facility]ist of [facility]  Patient name: Brad James MRN: [id number] DOB: 1927-08-10 Sex: male   REASON FOR VISIT:    f/u carotid  HISOTRY OF PRESENT ILLNESS:   Brad James is a 83 y.o. male who returns today for his first postoperative visit.  He is status post left carotid endarterectomy on 04/18/2016 for asymptomatic stenosis.  Intraoperative findings included an 85% stenosis.  He was discharged to home on postoperative day 1.  He returned a day later with complaints of swallowing difficulty and headaches as well as swelling around his incision.  He was admitted overnight he passed a swallowing evaluation.  He did not have a significant hematoma and his neck.  He was ultimately discharged.  He states that he no longer has swallowing issues.  He will occasionally have dizzy spells but attributes this to hypotension.  He was recently in the hospital for hypertension.    PAST MEDICAL HISTORY:   Past Medical History:  Diagnosis Date  . CAD (coronary artery disease) 01/04/2014  . Cancer ([place])    skin  . Carotid artery stenosis 01/04/2014  . Dizzy spells    At times per pt  . Essential hypertension, benign 01/04/2014  . Glaucoma    Bilateral  . Hyperlipidemia 01/04/2014  . Hypertension   . Insomnia 01/04/2014     FAMILY HISTORY:   Family History  Problem Relation Age of Onset  . Heart disease Mother   . Cancer Father 39    oral  . Stroke Brother 56    SOCIAL HISTORY:   Social History  Substance Use Topics  . Smoking status: Former Smoker    Quit date: 01/04/1969  . Smokeless tobacco: Never Used  . Alcohol use No     ALLERGIES:   No Known Allergies   CURRENT MEDICATIONS:   Current Outpatient Prescriptions  Medication Sig Dispense Refill  . aspirin EC 325 MG tablet Take 1 tablet (325 mg total) by mouth daily. 365 tablet 0  . atorvastatin (LIPITOR) 80 MG tablet Take 1 tablet (80 mg total) by mouth  daily. F/u labs in March 2017 90 tablet 2  . brinzolamide (AZOPT) 1 % ophthalmic suspension Place 1 drop into both eyes 2 (two) times daily. 10 mL 1  . ferrous sulfate 325 (65 FE) MG EC tablet Take 325 mg by mouth daily with breakfast.    . lisinopril (PRINIVIL,ZESTRIL) 40 MG tablet Take 40 mg by mouth daily.    . metoprolol (TOPROL-XL) 200 MG 24 hr tablet Take 200 mg by mouth daily.    . Multiple Vitamin (MULTIVITAMIN) tablet Take 1 tablet by mouth daily.    . naproxen (NAPROSYN) 500 MG tablet Take 1 tablet (500 mg total) by mouth 2 (two) times daily with a meal. As needed for pain 180 tablet 3  . omeprazole (PRILOSEC) 20 MG capsule Take 1 capsule (20 mg total) by mouth daily. 90 capsule 3  . PHENobarbital (LUMINAL) 32.4 MG tablet Take 2 tablets (64.8 mg total) by mouth at bedtime. 60 tablet 5  . Probiotic Product (PROBIOTIC PO) Take 1 tablet by mouth daily.    [name] triamcinolone ointment (KENALOG) 0.5 % Apply 1 application topically 2 (two) times daily. To affected area/rash, avoid eyes and face 30 g 1  . zolpidem (AMBIEN) 10 MG tablet TAKE ONE TABLET BY MOUTH AT BEDTIME AS NEEDED FOR SLEEP 30 tablet 3   No current facility-administered medications for this visit.     REVIEW OF SYSTEMS:   [  X] denotes positive finding, [ ]  denotes negative finding Cardiac  Comments:  Chest pain or chest pressure:    Shortness of breath upon exertion:    Short of breath when lying flat:    Irregular heart rhythm:        Vascular    Pain in calf, thigh, or hip brought on by ambulation:    Pain in feet at night that wakes you up from your sleep:     Blood clot in your veins:    Leg swelling:         Pulmonary    Oxygen at home:    Productive cough:     Wheezing:         Neurologic    Sudden weakness in arms or legs:     Sudden numbness in arms or legs:     Sudden onset of difficulty speaking or slurred speech:    Temporary loss of vision in one eye:     Problems with dizziness:           Gastrointestinal    Blood in stool:     Vomited blood:         Genitourinary    Burning when urinating:     Blood in urine:        Psychiatric    Major depression:         Hematologic    Bleeding problems:    Problems with blood clotting too easily:        Skin    Rashes or ulcers:        Constitutional    Fever or chills:      PHYSICAL EXAM:   Vitals:   01/07/17 1127 01/07/17 1129  BP: (!) 186/83 (!) 189/83  Pulse: (!) 56   Resp: 20   Temp: 97 F (36.1 C)   TempSrc: Oral   SpO2: 99%   Weight: 185 lb (83.9 kg)   Height: 6' (1.829 m)     GENERAL: The patient is a well-nourished male, in no acute distress. The vital signs are documented above. CARDIAC: There is a regular rate and rhythm.  PULMONARY: Non-labored respirations MUSCULOSKELETAL: There are no major deformities or cyanosis. NEUROLOGIC: No focal weakness or paresthesias are detected. SKIN: There are no ulcers or rashes noted. PSYCHIATRIC: The patient has a normal affect.  STUDIES:   I have ordered and reviewed his carotid duplex.  The left carotid endarterectomy site is widely patent.  There is mild disease on the right at 40-59 percent  MEDICAL ISSUES:   Status post left carotid endarterectomy: The patient will be placed on surveillance protocol.  His We will be in one year.    Annamarie Major, MD [facility] [phone] Pager [phone]

## 2017-01-08 ENCOUNTER — Encounter: Payer: Self-pay | Admitting: Osteopathic Medicine

## 2017-01-08 ENCOUNTER — Ambulatory Visit (INDEPENDENT_AMBULATORY_CARE_PROVIDER_SITE_OTHER): Payer: Medicare HMO | Admitting: Osteopathic Medicine

## 2017-01-08 VITALS — BP 148/73 | HR 61 | Ht 72.0 in | Wt 183.0 lb

## 2017-01-08 DIAGNOSIS — R69 Illness, unspecified: Secondary | ICD-10-CM | POA: Diagnosis not present

## 2017-01-08 DIAGNOSIS — F5101 Primary insomnia: Secondary | ICD-10-CM | POA: Diagnosis not present

## 2017-01-08 DIAGNOSIS — Z87898 Personal history of other specified conditions: Secondary | ICD-10-CM

## 2017-01-08 DIAGNOSIS — I10 Essential (primary) hypertension: Secondary | ICD-10-CM

## 2017-01-08 MED ORDER — METOPROLOL SUCCINATE ER 100 MG PO TB24: 100.0000 mg | ORAL_TABLET | Freq: Every day | ORAL | 3 refills | Status: DC

## 2017-01-08 NOTE — Progress Notes (Signed)
HPI: Brad James is a 83 y.o. male  who presents to [place] today, 01/08/17,  for chief complaint of:  Chief Complaint  Patient presents with  . Follow-up    BLOOD PRESSURE     HTN: Last visit: BP was low at 118/54, we decreased metoprolol to 100 mg daily. Brings home BP logs with him but doesn't have monitor here. BP occasionally down into low 100s or as high as 431V systolic. Pt stresses out a great deal when BP is looking higher. N chest pain or pressure at this time. No dizziness.   Insomnia: Is weaning off Ambien, down to 5 mg qhs and noticing no adverse change!   Seizure hx: again, very anxious to d/c phenobarb despite remote hx seizure post-TBI. Refill needed. Aware of sedation risk and accidental OD risk.   Patient is accompanied by daughter who assists with history-taking.   Past medical, surgical, social and family history reviewed: Patient Active Problem List   Diagnosis Date Noted  . Abnormal skin growth 08/07/2016  . History of seizure 07/09/2016  . Low back pain 07/02/2016  . Primary open angle glaucoma 03/19/2016  . Carotid stenosis 03/12/2016  . Pulmonary nodules 06/08/2015  . Gout 04/18/2015  . Skin cancer 07/22/2014  . Nodular basal cell carcinoma 06/10/2014  . Squamous cell carcinoma in situ 06/10/2014  . Renal artery stenosis ([place]) 02/22/2014  . Chronic pancreatitis ([place]) 02/22/2014  . 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 Primary open angle glaucoma of both eyes 02/22/2014  . History of basal cell carcinoma 02/22/2014  . CAD (coronary artery disease) 01/04/2014  . Essential hypertension, benign 01/04/2014  . Hyperlipidemia 01/04/2014  . Insomnia 01/04/2014  . Carotid artery stenosis 01/04/2014  . Epilepsy ([place]) 01/04/2014   Past Surgical History:  Procedure Laterality Date  . COLONOSCOPY    . CORONARY ANGIOPLASTY WITH STENT PLACEMENT     stated 3-4 over past 10 yrs.   [name] ENDARTERECTOMY Left 04/18/2016   Procedure: LEFT CAROTID ENDARTERECTOMY;  Surgeon: Serafina Mitchell, MD;  Location: [facility];  Service: Vascular;  Laterality: Left;  [name] MELANOMA EXCISION    . PATCH ANGIOPLASTY Left 04/18/2016   Procedure: PATCH ANGIOPLASTY USING Rueben Bash BIOLOGIC PATCH;  Surgeon: Serafina Mitchell, MD;  [facility]: [facility];  Service: Vascular;  Laterality: Left;   Social History  Substance Use Topics  . Smoking status: Former Smoker    Quit date: 01/04/1969  . Smokeless tobacco: Never Used  . Alcohol use No   Family History  Problem Relation Age of Onset  . Heart disease Mother   . Cancer Father 26    oral  . Stroke Brother 32     Current medication list and allergy/intolerance information reviewed:   Current Outpatient Prescriptions  Medication Sig Dispense Refill  . aspirin EC 325 MG tablet Take 1 tablet (325 mg total) by mouth daily. 365 tablet 0  . atorvastatin (LIPITOR) 80 MG tablet Take 1 tablet (80 mg total) by mouth daily. F/u labs in March 2017 90 tablet 2  . brinzolamide (AZOPT) 1 % ophthalmic suspension Place 1 drop into both eyes 2 (two) times daily. 10 mL 1  . ferrous sulfate 325 (65 FE) MG EC tablet Take 325 mg by mouth daily with breakfast.    . lisinopril (PRINIVIL,ZESTRIL) 40 MG tablet Take 40 mg by mouth daily.    . metoprolol (TOPROL-XL) 200 MG 24 hr tablet Take 200 mg by mouth daily.    . Multiple  Vitamin (MULTIVITAMIN) tablet Take 1 tablet by mouth daily.    . naproxen (NAPROSYN) 500 MG tablet Take 1 tablet (500 mg total) by mouth 2 (two) times daily with a meal. As needed for pain 180 tablet 3  . omeprazole (PRILOSEC) 20 MG capsule Take 1 capsule (20 mg total) by mouth daily. 90 capsule 3  . PHENobarbital (LUMINAL) 32.4 MG tablet Take 2 tablets (64.8 mg total) by mouth at bedtime. 60 tablet 5  . Probiotic Product (PROBIOTIC PO) Take 1 tablet by mouth daily.    [name] triamcinolone ointment (KENALOG) 0.5 % Apply 1 application topically 2 (two) times daily. To affected area/rash, avoid eyes and  face 30 g 1  . zolpidem (AMBIEN) 10 MG tablet TAKE ONE TABLET BY MOUTH AT BEDTIME AS NEEDED FOR SLEEP 30 tablet 3   No current facility-administered medications for this visit.    No Known Allergies    Review of Systems:  Constitutional:  No  fever, no chills, No recent illness  HEENT: No  headache, no vision change  Cardiac: No  chest pain, No  pressure, No palpitations, No  Orthopnea  Respiratory:  No  shortness of breath. No  Cough  Neurologic: No  weakness, No  dizziness, No  slurred speech/focal weakness/facial droop  Psychiatric: No  concerns with depression, +concerns with anxiety, +sleep problems, No mood problems  Exam:  BP (!) 148/73   Pulse 61   Ht 6' (1.829 m)   Wt 183 lb (83 kg)   BMI 24.82 kg/m   Constitutional: VS see above. General Appearance: alert, well-developed, well-nourished, NAD  Eyes: Normal lids and conjunctive, non-icteric sclera  Ears, Nose, Mouth, Throat: MMM, Normal external inspection ears/nares/mouth/lips/gums.   Neck: No masses, trachea midline.   Respiratory: Normal respiratory effort. no wheeze, no rhonchi, no rales  Cardiovascular: S1/S2 normal, no murmur, no rub/gallop auscultated. RRR. No lower extremity edema. . Bowel sounds normal. Rectal exam deferred.   Musculoskeletal: Gait normal. No clubbing/cyanosis of digits.   Neurological: Normal balance/coordination. No tremor.  Psychiatric: Normal judgment/insight. Normal mood and affect. Oriented x3.     ASSESSMENT/PLAN: The primary encounter diagnosis was Essential hypertension, benign. Diagnoses of Primary insomnia and History of seizure were also pertinent to this visit.  Continue current meds - BP in office is okay but if home numbers are accurate then BP quite labile. Needs to come back w/ home cuff for verification - advised schedule nurse visit in next few weeks and routine follow-up pending that nurse visit will be in 3 mos.    Continue Ambien at 5 mg qhs for few weeks  then can try tapering down further.   Continue phenobarbital for now   Visit summary with medication list and pertinent instructions was printed for patient to review. All questions at time of visit were answered - patient instructed to contact office with any additional concerns. ER/RTC precautions were reviewed with the patient. Follow-up plan: Return in about 3 months (around 04/09/2017) for routine check up and BP follow-up, sooner if needed.

## 2017-01-08 NOTE — Addendum Note (Signed)
Addended by: Lianne Cure A on: 01/08/2017 04:55 PM   Modules accepted: Orders

## 2017-01-09 ENCOUNTER — Ambulatory Visit (INDEPENDENT_AMBULATORY_CARE_PROVIDER_SITE_OTHER): Payer: Medicare HMO | Admitting: Osteopathic Medicine

## 2017-01-09 VITALS — BP 152/50 | HR 54

## 2017-01-09 DIAGNOSIS — I10 Essential (primary) hypertension: Secondary | ICD-10-CM

## 2017-01-09 NOTE — Progress Notes (Signed)
   Subjective:    Patient ID: Brad James, male    DOB: 1927-08-08, 83 y.o.   MRN: [id number]  Hypertension  This is a chronic problem.      Review of Systems     Objective:   Physical Exam        Assessment & Plan:

## 2017-01-11 NOTE — Progress Notes (Signed)
Advised he can continue with current medications given risk of drop in blood pressure which he experienced at higher dose.  Home BP cuff is right at 10 mmHg difference - wouldn't trust home numbers too much. Plan to come see me in the office in 2 weeks.   BP (!) 152/50   Pulse (!) 54

## 2017-01-14 DIAGNOSIS — D0339 Melanoma in situ of other parts of face: Secondary | ICD-10-CM | POA: Diagnosis not present

## 2017-01-14 DIAGNOSIS — D485 Neoplasm of uncertain behavior of skin: Secondary | ICD-10-CM | POA: Diagnosis not present

## 2017-01-29 ENCOUNTER — Other Ambulatory Visit: Payer: Self-pay

## 2017-01-29 MED ORDER — AMLODIPINE BESYLATE 10 MG PO TABS: 10.0000 mg | ORAL_TABLET | Freq: Every day | ORAL | 11 refills | Status: DC

## 2017-01-29 MED ORDER — LISINOPRIL 40 MG PO TABS: 40.0000 mg | ORAL_TABLET | Freq: Every day | ORAL | 11 refills | Status: DC

## 2017-01-29 NOTE — Telephone Encounter (Signed)
Patient request refill for Lisinopril 40 mg and Amlodipine 10 mg. #[phone] refills sent to [facility]. Rhonda Cunningham,CMA

## 2017-02-04 DIAGNOSIS — D0339 Melanoma in situ of other parts of face: Secondary | ICD-10-CM | POA: Diagnosis not present

## 2017-02-18 DIAGNOSIS — D0339 Melanoma in situ of other parts of face: Secondary | ICD-10-CM | POA: Diagnosis not present

## 2017-03-19 DIAGNOSIS — I10 Essential (primary) hypertension: Secondary | ICD-10-CM | POA: Diagnosis not present

## 2017-03-19 DIAGNOSIS — I2510 Atherosclerotic heart disease of native coronary artery without angina pectoris: Secondary | ICD-10-CM | POA: Diagnosis not present

## 2017-03-19 DIAGNOSIS — Z955 Presence of coronary angioplasty implant and graft: Secondary | ICD-10-CM | POA: Diagnosis not present

## 2017-04-24 ENCOUNTER — Other Ambulatory Visit: Payer: Self-pay | Admitting: Osteopathic Medicine

## 2017-05-21 ENCOUNTER — Encounter: Payer: Self-pay | Admitting: Sports Medicine

## 2017-05-21 ENCOUNTER — Ambulatory Visit (INDEPENDENT_AMBULATORY_CARE_PROVIDER_SITE_OTHER): Payer: Medicare HMO

## 2017-05-21 ENCOUNTER — Ambulatory Visit (INDEPENDENT_AMBULATORY_CARE_PROVIDER_SITE_OTHER): Payer: Medicare HMO | Admitting: Sports Medicine

## 2017-05-21 DIAGNOSIS — M7732 Calcaneal spur, left foot: Secondary | ICD-10-CM | POA: Diagnosis not present

## 2017-05-21 DIAGNOSIS — M19071 Primary osteoarthritis, right ankle and foot: Secondary | ICD-10-CM

## 2017-05-21 DIAGNOSIS — M25571 Pain in right ankle and joints of right foot: Secondary | ICD-10-CM | POA: Diagnosis not present

## 2017-05-21 DIAGNOSIS — M19072 Primary osteoarthritis, left ankle and foot: Secondary | ICD-10-CM

## 2017-05-21 MED ORDER — MELOXICAM 15 MG PO TABS: ORAL_TABLET | ORAL | 3 refills | Status: DC

## 2017-05-21 NOTE — Patient Instructions (Signed)
Add name to High Dose Flu Shot wait list.

## 2017-05-21 NOTE — Progress Notes (Signed)
Subjective:    I'm seeing this patient as a consultation for:  Dr. Emeterio [name]  CC: Bilateral ankle pain  HPI: This is a very pleasant 83 year old male, he has multiple comorbidities. Over the past several weeks to months he's noted increasing pain that he localizes over both ankles, moderate, persistent without radiation, left worse than right. The onset was fairly gradual, no trauma. He does not note that he had any episodes of severe redness or swelling of either foot or ankle. He does get gelling. Has never had imaging, has tried some oral NSAIDs such as ibuprofen and naproxen without any improvement.  Past medical history, Surgical history, Family history not pertinant except as noted below, Social history, Allergies, and medications have been entered into the medical record, reviewed, and no changes needed.   Review of Systems: No headache, visual changes, nausea, vomiting, diarrhea, constipation, dizziness, abdominal pain, skin rash, fevers, chills, night sweats, weight loss, swollen lymph nodes, body aches, joint swelling, muscle aches, chest pain, shortness of breath, mood changes, visual or auditory hallucinations.   Objective:   General: Well Developed, well nourished, and in no acute distress.  Neuro:  Extra-ocular muscles intact, able to move all 4 extremities, sensation grossly intact.  Deep tendon reflexes tested were normal. Psych: Alert and oriented, mood congruent with affect. ENT:  Ears and nose appear unremarkable.  Hearing grossly normal. Neck: Unremarkable overall appearance, trachea midline.  No visible thyroid enlargement. Eyes: Conjunctivae and lids appear unremarkable.  Pupils equal and round. Skin: Warm and dry, no rashes noted.  Cardiovascular: Pulses palpable, no extremity edema. Bilateral ankles: Both ankles are swollen over the tibiotalar joint, left worse than right.  There is also 2+ lower extremity pitting edema. Range of motion is full in all  directions. Strength is 5/5 in all directions. Stable lateral and medial ligaments; squeeze test and kleiger test unremarkable; Tender all over the talar dome medially and laterally. No pain at base of 5th MT; No tenderness over cuboid; No tenderness over N spot or navicular prominence No tenderness on posterior aspects of lateral and medial malleolus No sign of peroneal tendon subluxations; Negative tarsal tunnel tinel's Able to walk 4 steps.  Procedure: Real-time Ultrasound Guided Injection of left ankle Device: GE Logiq E  Verbal informed consent obtained.  Time-out conducted.  Noted no overlying erythema, induration, or other signs of local infection.  Skin prepped in a sterile fashion.  Local anesthesia: Topical Ethyl chloride.  With sterile technique and under real time ultrasound guidance:  25-gauge needle advanced into the joint, I then injected 1 mL kenalog 40, 1 mL lidocaine, 1 mL bupivacaine. Completed without difficulty  Pain immediately resolved suggesting accurate placement of the medication.  Advised to call if fevers/chills, erythema, induration, drainage, or persistent bleeding.  Images permanently stored and available for review in the ultrasound unit.  Impression: Technically successful ultrasound guided injection.  Procedure: Real-time Ultrasound Guided Injection of right ankle Device: GE Logiq E  Verbal informed consent obtained.  Time-out conducted.  Noted no overlying erythema, induration, or other signs of local infection.  Skin prepped in a sterile fashion.  Local anesthesia: Topical Ethyl chloride.  With sterile technique and under real time ultrasound guidance:  25-gauge needle advanced into the joint, I then injected 1 mL kenalog 40, 1 mL lidocaine, 1 mL bupivacaine. Completed without difficulty  Pain immediately resolved suggesting accurate placement of the medication.  Advised to call if fevers/chills, erythema, induration, drainage, or persistent  bleeding.  Images  permanently stored and available for review in the ultrasound unit.  Impression: Technically successful ultrasound guided injection.  Both ankles are strapped with compressive dressing.  Ankle x-rays reviewed, there are degenerative changes on both sides.  Impression and Recommendations:   This case required medical decision making of moderate complexity.  Primary osteoarthritis of both ankles Bilateral ankle pain, tibiotalar, with improvement in pain with bilateral ankle joint injections, no erythema to suggest gout and chronology of the increase in pain is not consistent with gout. There is also not a joint effusion, edema was simply subcutaneous likely dependent pitting edema. We did bilateral injections, adding meloxicam, bilateral x-rays, return to see me in one month.  ___________________________________________ Gwen Her. Dianah Field, M.D., ABFM., CAQSM. Primary Care and [place] Instructor of [place]

## 2017-05-21 NOTE — Assessment & Plan Note (Signed)
Bilateral ankle pain, tibiotalar, with improvement in pain with bilateral ankle joint injections, no erythema to suggest gout and chronology of the increase in pain is not consistent with gout. There is also not a joint effusion, edema was simply subcutaneous likely dependent pitting edema. We did bilateral injections, adding meloxicam, bilateral x-rays, return to see me in one month.

## 2017-06-05 ENCOUNTER — Other Ambulatory Visit: Payer: Self-pay | Admitting: Cardiology

## 2017-06-10 DIAGNOSIS — C44311 Basal cell carcinoma of skin of nose: Secondary | ICD-10-CM | POA: Diagnosis not present

## 2017-06-18 ENCOUNTER — Encounter: Payer: Self-pay | Admitting: Sports Medicine

## 2017-06-18 ENCOUNTER — Ambulatory Visit (INDEPENDENT_AMBULATORY_CARE_PROVIDER_SITE_OTHER): Payer: Medicare HMO | Admitting: Sports Medicine

## 2017-06-18 DIAGNOSIS — M19072 Primary osteoarthritis, left ankle and foot: Secondary | ICD-10-CM

## 2017-06-18 DIAGNOSIS — Z23 Encounter for immunization: Secondary | ICD-10-CM

## 2017-06-18 DIAGNOSIS — M19071 Primary osteoarthritis, right ankle and foot: Secondary | ICD-10-CM

## 2017-06-18 NOTE — Progress Notes (Signed)
  Subjective:    CC: Followup  HPI: This is a pleasant 83 year old male, I injected both of his ankles at the last visit he returns today pain-free.  Past medical history:  Negative.  See flowsheet/record as well for more information.  Surgical history: Negative.  See flowsheet/record as well for more information.  Family history: Negative.  See flowsheet/record as well for more information.  Social history: Negative.  See flowsheet/record as well for more information.  Allergies, and medications have been entered into the medical record, reviewed, and no changes needed.   Review of Systems: No fevers, chills, night sweats, weight loss, chest pain, or shortness of breath.   Objective:    General: Well Developed, well nourished, and in no acute distress.  Neuro: Alert and oriented x3, extra-ocular muscles intact, sensation grossly intact.  HEENT: Normocephalic, atraumatic, pupils equal round reactive to light, neck supple, no masses, no lymphadenopathy, thyroid nonpalpable.  Skin: Warm and dry, no rashes. Cardiac: Regular rate and rhythm, no murmurs rubs or gallops, no lower extremity edema.  Respiratory: Clear to auscultation bilaterally. Not using accessory muscles, speaking in full sentences.  Impression and Recommendations:    Primary osteoarthritis of both ankles Good response to bilateral tibiotalar joint injections at the last visit, pain is 80% to 100% better. He will return for custom molded orthotics.  I spent 25 minutes with this patient, greater than 50% was face-to-face time counseling regarding the above diagnoses ___________________________________________ Gwen Her. Dianah Field, M.D., ABFM., CAQSM. Primary Care and [place] Instructor of [place] of [facility] of Medicine

## 2017-06-18 NOTE — Assessment & Plan Note (Signed)
Good response to bilateral tibiotalar joint injections at the last visit, pain is 80% to 100% better. He will return for custom molded orthotics.

## 2017-06-19 ENCOUNTER — Ambulatory Visit (INDEPENDENT_AMBULATORY_CARE_PROVIDER_SITE_OTHER): Payer: Medicare HMO | Admitting: Sports Medicine

## 2017-06-19 ENCOUNTER — Encounter: Payer: Self-pay | Admitting: Sports Medicine

## 2017-06-19 DIAGNOSIS — M19072 Primary osteoarthritis, left ankle and foot: Secondary | ICD-10-CM

## 2017-06-19 DIAGNOSIS — M19071 Primary osteoarthritis, right ankle and foot: Secondary | ICD-10-CM

## 2017-06-19 NOTE — Assessment & Plan Note (Signed)
Essentially pain-free after injections 80-100%. Custom orthotics as above, return to see me as needed.

## 2017-06-19 NOTE — Progress Notes (Signed)
    Patient was fitted for a : standard, cushioned, semi-rigid orthotic. The orthotic was heated and afterward the patient stood on the orthotic blank positioned on the orthotic stand. The patient was positioned in subtalar neutral position and 10 degrees of ankle dorsiflexion in a weight bearing stance. After completion of molding, a stable base was applied to the orthotic blank. The blank was ground to a stable position for weight bearing. Size: 12 Base: White EVA Additional Posting and Padding: None The patient ambulated these, and they were very comfortable.  I spent 40 minutes with this patient, greater than 50% was face-to-face time counseling regarding the below diagnosis.  ___________________________________________ Ymani Porcher J. Zea Kostka, M.D., ABFM., CAQSM. Primary Care and Sports Medicine [facility]  Adjunct Instructor of Family Medicine  [employer]   

## 2017-07-01 ENCOUNTER — Ambulatory Visit (INDEPENDENT_AMBULATORY_CARE_PROVIDER_SITE_OTHER): Payer: Medicare HMO | Admitting: Osteopathic Medicine

## 2017-07-01 VITALS — BP 165/73 | HR 68 | Ht 72.0 in | Wt 187.0 lb

## 2017-07-01 DIAGNOSIS — R7301 Impaired fasting glucose: Secondary | ICD-10-CM

## 2017-07-01 DIAGNOSIS — Z0000 Encounter for general adult medical examination without abnormal findings: Secondary | ICD-10-CM | POA: Diagnosis not present

## 2017-07-01 DIAGNOSIS — I10 Essential (primary) hypertension: Secondary | ICD-10-CM | POA: Diagnosis not present

## 2017-07-01 MED ORDER — PHENOBARBITAL 32.4 MG PO TABS: 64.8000 mg | ORAL_TABLET | Freq: Every day | ORAL | 5 refills | Status: DC

## 2017-07-01 MED ORDER — ZOLPIDEM TARTRATE 5 MG PO TABS: ORAL_TABLET | ORAL | 0 refills | Status: DC

## 2017-07-01 MED ORDER — ASPIRIN 81 MG PO TBEC: 81.0000 mg | DELAYED_RELEASE_TABLET | Freq: Every day | ORAL | 3 refills | Status: AC

## 2017-07-01 MED ORDER — METOPROLOL SUCCINATE ER 50 MG PO TB24: 50.0000 mg | ORAL_TABLET | Freq: Two times a day (BID) | ORAL | 3 refills | Status: DC

## 2017-07-01 MED ORDER — LISINOPRIL 20 MG PO TABS: 20.0000 mg | ORAL_TABLET | Freq: Two times a day (BID) | ORAL | 1 refills | Status: DC

## 2017-07-01 NOTE — Patient Instructions (Signed)
Plan: Consider newer shingles shot, and consider bone density test Continue current medications  Will come back for skin lesion removal

## 2017-07-01 NOTE — Progress Notes (Signed)
Subjective:   Brad James is a 83 y.o. male who presents for Medicare Annual/Subsequent preventive examination.  Review of Systems:  Negative      Cardiology notes reviewed  Assesment: 1. Coronary artery disease involving native coronary artery of native heart without angina pectoris  2. Essential hypertension  3. S/P coronary artery stent placement   Plan:The patient is stable from a cardiac standpoint and we might be able to alleviate some of his symptoms by increasing the frequency of his medications but keeping the total dose the same. 1. Change metoprolol to 50 mg twice a day 2. Change lisinopril to 20 mg twice a day 3. We will keep his amlodipine at 10 mg daily but that could also be changed to 5 mg twice a day if necessary He will get follow-up with his PCP in regard to his blood pressure and he does check it at home as well. Care plan and follow-up as discussed or as needed if any worsening symptoms or change in condition. Pt expressed understanding. No barriers to meeting goals. After visit summary was given to the patient.  Follow-up in about 1 year (around 03/19/2018). Brad James        Objective:    Vitals: BP (!) 165/73   Pulse 68   Ht 6' (1.829 m)   Wt 187 lb (84.8 kg)   BMI 25.36 kg/m   Body mass index is 25.36 kg/m.  Tobacco History  Smoking Status  . Former Smoker  . Quit date: 01/04/1969  Smokeless Tobacco  . Never Used     Counseling given: Not Answered   Past Medical History:  Diagnosis Date  . CAD (coronary artery disease) 01/04/2014  . Cancer ([place])    skin  . Carotid artery stenosis 01/04/2014  . Dizzy spells    At times per pt  . Essential hypertension, benign 01/04/2014  . Glaucoma    Bilateral  . Hyperlipidemia 01/04/2014  . Hypertension   . Insomnia 01/04/2014   Past Surgical History:  Procedure Laterality Date  . COLONOSCOPY    . CORONARY ANGIOPLASTY WITH STENT PLACEMENT     stated 3-4 over past 10 yrs.   Brad James  ENDARTERECTOMY Left 04/18/2016   Procedure: LEFT CAROTID ENDARTERECTOMY;  Surgeon: Serafina Mitchell, MD;  Location: [place];  Service: Vascular;  Laterality: Left;  Brad James MELANOMA EXCISION    . PATCH ANGIOPLASTY Left 04/18/2016   Procedure: PATCH ANGIOPLASTY USING Rueben Bash BIOLOGIC PATCH;  Surgeon: Serafina Mitchell, MD;  Location: [facility] OR;  Service: Vascular;  Laterality: Left;   Family History  Problem Relation Age of Onset  . Heart disease Mother   . Cancer Father 62       oral  . Stroke Brother 41   History  Sexual Activity  . Sexual activity: Not Currently  . Partners: Female    Outpatient Encounter Prescriptions as of 07/01/2017  Medication Sig  . amLODipine (NORVASC) 10 MG tablet Take 1 tablet (10 mg total) by mouth daily.  Brad James aspirin EC 325 MG tablet Take 1 tablet (325 mg total) by mouth daily.  Brad James atorvastatin (LIPITOR) 80 MG tablet TAKE ONE TABLET BY MOUTH ONCE DAILY  . brinzolamide (AZOPT) 1 % ophthalmic suspension Place 1 drop into both eyes 2 (two) times daily.  . ferrous sulfate 325 (65 FE) MG EC tablet Take 325 mg by mouth daily with breakfast.  . lisinopril (PRINIVIL,ZESTRIL) 40 MG tablet Take 1 tablet (40 mg total) by mouth daily.  . meloxicam (  MOBIC) 15 MG tablet One tab PO qAM with breakfast for 2 weeks, then daily prn pain.  . metoprolol (TOPROL-XL) 100 MG 24 hr tablet Take 1 tablet (100 mg total) by mouth daily.  . Multiple Vitamin (MULTIVITAMIN) tablet Take 1 tablet by mouth daily.  Brad James omeprazole (PRILOSEC) 20 MG capsule Take 1 capsule (20 mg total) by mouth daily.  Brad James PHENobarbital (LUMINAL) 32.4 MG tablet Take 2 tablets (64.8 mg total) by mouth at bedtime.  . Probiotic Product (PROBIOTIC PO) Take 1 tablet by mouth daily.  Brad James triamcinolone ointment (KENALOG) 0.5 % Apply 1 application topically 2 (two) times daily. To affected area/rash, avoid eyes and face  . zolpidem (AMBIEN) 10 MG tablet TAKE 1 TABLET BY MOUTH ONCE DAILY AS NEEDED FOR SLEEP   No facility-administered  encounter medications on file as of 07/01/2017.     Activities of Daily Living No flowsheet data found.  Patient Care Team: Brad [name], DO as PCP - General (Osteopathic Medicine)   Assessment:     Exercise Activities and Dietary recommendations: exercise as tolerated, balanced diet discussed     Goals    None     Fall Risk Fall Risk  01/03/2017 01/04/2014  Falls in the past year? No No   Depression Screen PHQ 2/9 Scores 05/21/2017 01/03/2017 01/04/2014  PHQ - 2 Score 0 0 0    Cognitive Function: intact mentation         Immunization History  Administered Date(s) Administered  . Influenza, High Dose Seasonal PF 06/18/2017  . Influenza,inj,Quad PF,6+ Mos 06/14/2016  . Influenza-Unspecified 06/18/2015  . Pneumococcal Conjugate-13 01/04/2014  . Pneumococcal Polysaccharide-23 09/17/1998  . Zoster 09/18/2011   Screening Tests Health Maintenance  Topic Date Due  . TETANUS/TDAP  07/18/1946  . INFLUENZA VACCINE  Completed  . PNA vac Low Risk Adult  Completed      Plan:     I have personally reviewed and noted the following in the patient's chart:   . Medical and social history . Use of alcohol, tobacco or illicit drugs  . Current medications and supplements . Functional ability and status . Nutritional status . Physical activity . Advanced directives . List of other physicians . Hospitalizations, surgeries, and ER visits in previous 12 months . Vitals . Screenings to include cognitive, depression, and falls . Referrals and appointments  In addition, I have reviewed and discussed with patient certain preventive protocols, quality metrics, and best practice recommendations. A written personalized care plan for preventive services as well as general preventive health recommendations were provided to patient.     Brad [name], DO  07/01/2017    MALE PREVENTIVE CARE  updated 07/01/17  ANNUAL SCREENING/COUNSELING  Any changes to health in the  past year? no  Diet/Exercise - HEALTHY HABITS DISCUSSED TO DECREASE CV RISK History  Smoking Status  . Former Smoker  . Quit date: 01/04/1969  Smokeless Tobacco  . Never Used   History  Alcohol Use No   Depression screen PHQ 2/9 05/21/2017  Decreased Interest 0  Down, Depressed, Hopeless 0  PHQ - 2 Score 0    INFECTIOUS DISEASE SCREENING  HIV - does not need  GC/CT - does not need  HepC - does not need  TB - does not need  CANCER SCREENING  Lung - USPSTF: 55-80yo w/ 30 py hx unless quit w/in 58yr - does not need  Colon - does not need  Prostate - does not need  OTHER DISEASE SCREENING  Lipid -  needs  DM2 - needs  AAA - 65-75yo ever smoked: does not need  Osteoporosis - men 81yo+ - does not need  ADULT VACCINATION  Influenza - annual vaccine recommended  Td - booster every 10 years   Zoster - Shingrix recommended 70+ years old  PCV13 - already has  PPSV23 - already has Immunization History  Administered Date(s) Administered  . Influenza, High Dose Seasonal PF 06/18/2017  . Influenza,inj,Quad PF,6+ Mos 06/14/2016  . Influenza-Unspecified 06/18/2015  . Pneumococcal Conjugate-13 01/04/2014  . Pneumococcal Polysaccharide-23 09/17/1998  . Zoster 09/18/2011    OTHER  Fall - exercise and Vit D age 42+ - needs  Consider ASA - age 18-59 - does not need

## 2017-07-02 DIAGNOSIS — H02115 Cicatricial ectropion of left lower eyelid: Secondary | ICD-10-CM | POA: Diagnosis not present

## 2017-07-02 DIAGNOSIS — H401122 Primary open-angle glaucoma, left eye, moderate stage: Secondary | ICD-10-CM | POA: Diagnosis not present

## 2017-07-02 DIAGNOSIS — H02112 Cicatricial ectropion of right lower eyelid: Secondary | ICD-10-CM | POA: Diagnosis not present

## 2017-07-02 DIAGNOSIS — H401111 Primary open-angle glaucoma, right eye, mild stage: Secondary | ICD-10-CM | POA: Diagnosis not present

## 2017-07-02 DIAGNOSIS — H527 Unspecified disorder of refraction: Secondary | ICD-10-CM | POA: Diagnosis not present

## 2017-07-03 ENCOUNTER — Ambulatory Visit (INDEPENDENT_AMBULATORY_CARE_PROVIDER_SITE_OTHER): Payer: Medicare HMO | Admitting: Osteopathic Medicine

## 2017-07-03 ENCOUNTER — Encounter: Payer: Self-pay | Admitting: Osteopathic Medicine

## 2017-07-03 VITALS — BP 169/87 | HR 67 | Wt 188.0 lb

## 2017-07-03 DIAGNOSIS — C44729 Squamous cell carcinoma of skin of left lower limb, including hip: Secondary | ICD-10-CM | POA: Diagnosis not present

## 2017-07-03 DIAGNOSIS — L989 Disorder of the skin and subcutaneous tissue, unspecified: Secondary | ICD-10-CM

## 2017-07-03 MED ORDER — CEPHALEXIN 500 MG PO CAPS: 500.0000 mg | ORAL_CAPSULE | Freq: Two times a day (BID) | ORAL | 0 refills | Status: DC

## 2017-07-03 NOTE — Progress Notes (Signed)
HPI: Brad James is a 83 y.o. male  who presents to [place] today, 07/03/17,  for chief complaint of:  Chief Complaint  Patient presents with  . Other    SKIN PROCEDURE      Past medical history, surgical history, social history and family history reviewed.    Current medication list and allergy/intolerance information reviewed.      Review of Systems:  Constitutional: No recent illness, feels well today   Skin: +Rash   Exam:  BP (!) 169/87   Pulse 67   Wt 188 lb (85.3 kg)   BMI 25.50 kg/m   Constitutional: VS see above. General Appearance: alert, well-developed, well-nourished, NAD  Skin: warm, dry, intact. Multiple benign and one concerning skin lesion on L lower extremity   PRE-OP DIAGNOSIS: Abnormal Skin Lesion Suspicious for squamous cell versus basal cell versus other POST-OP DIAGNOSIS: Same  PROCEDURE: skin biopsy Performing Physician: Emeterio [name]   PROCEDURE:  Excisional Biopsy    The area surrounding the skin lesion was prepared and draped in the usual sterile manner. The lesion was removed in the usual manner by the biopsy method noted above. 3 subcutaneous simple interrupted sutures were placed of 3. 0 Vicryl. 5 superficial interrupted horizontal mattress sutures with 0.0 Prolene were placed to close dermis/epidermis. Hemostasis was assured. The patient tolerated the procedure well.     Followup: The patient tolerated the procedure well without complications.  Standard post-procedure care is explained and return precautions are given.      ASSESSMENT/PLAN:   Skin lesion of left lower extremity - Plan: Dermatology pathology     Visit summary with medication list and pertinent instructions was printed for patient to review, alert [place] if any changes needed. All questions at time of visit were answered - patient instructed to contact office with any additional concerns. ER/RTC precautions were reviewed  with the patient and understanding verbalized.

## 2017-07-09 DIAGNOSIS — E785 Hyperlipidemia, unspecified: Secondary | ICD-10-CM | POA: Diagnosis not present

## 2017-07-09 DIAGNOSIS — Z0000 Encounter for general adult medical examination without abnormal findings: Secondary | ICD-10-CM | POA: Diagnosis not present

## 2017-07-09 DIAGNOSIS — R7301 Impaired fasting glucose: Secondary | ICD-10-CM | POA: Diagnosis not present

## 2017-07-10 NOTE — Addendum Note (Signed)
Addended by: Maryla Morrow on: 07/10/2017 06:31 PM   Modules accepted: Orders

## 2017-07-11 ENCOUNTER — Other Ambulatory Visit: Payer: Self-pay | Admitting: Cardiology

## 2017-07-13 LAB — COMPLETE METABOLIC PANEL WITH GFR
AG Ratio: 1.7 (calc) (ref 1.0–2.5)
ALBUMIN MSPROF: 3.8 g/dL (ref 3.6–5.1)
ALKALINE PHOSPHATASE (APISO): 76 U/L (ref 40–115)
ALT: 15 U/L (ref 9–46)
AST: 17 U/L (ref 10–35)
BUN / CREAT RATIO: 15 (calc) (ref 6–22)
BUN: 20 mg/dL (ref 7–25)
CO2: 28 mmol/L (ref 20–32)
CREATININE: 1.31 mg/dL — AB (ref 0.70–1.11)
Calcium: 9 mg/dL (ref 8.6–10.3)
Chloride: 107 mmol/L (ref 98–110)
GFR, EST AFRICAN AMERICAN: 56 mL/min/{1.73_m2} — AB (ref >=60)
GFR, Est Non African American: 49 mL/min/{1.73_m2} — ABNORMAL LOW (ref >=60)
GLUCOSE: 106 mg/dL — AB (ref 65–99)
Globulin: 2.2 g/dL (calc) (ref 1.9–3.7)
Potassium: 4.7 mmol/L (ref 3.5–5.3)
Sodium: 143 mmol/L (ref 135–146)
TOTAL PROTEIN: 6 g/dL — AB (ref 6.1–8.1)
Total Bilirubin: 0.4 mg/dL (ref 0.2–1.2)

## 2017-07-13 LAB — CBC
HCT: 39.5 % (ref 38.5–50.0)
HEMOGLOBIN: 13.2 g/dL (ref 13.2–17.1)
MCH: 31.7 pg (ref 27.0–33.0)
MCHC: 33.4 g/dL (ref 32.0–36.0)
MCV: 94.7 fL (ref 80.0–100.0)
MPV: 9.1 fL (ref 7.5–12.5)
Platelets: 227 10*3/uL (ref 140–400)
RBC: 4.17 10*6/uL — AB (ref 4.20–5.80)
RDW: 12.6 % (ref 11.0–15.0)
WBC: 6.4 10*3/uL (ref 3.8–10.8)

## 2017-07-13 LAB — PHENOBARBITAL LEVEL: Phenobarbital, Serum: 10.4 mg/L — ABNORMAL LOW (ref 15.0–40.0)

## 2017-07-13 LAB — LIPID PANEL
CHOL/HDL RATIO: 4.1 (calc) (ref <5.0)
Cholesterol: 152 mg/dL (ref <200)
HDL: 37 mg/dL — ABNORMAL LOW (ref >40)
LDL CHOLESTEROL (CALC): 84 mg/dL
NON-HDL CHOLESTEROL (CALC): 115 mg/dL (ref <130)
Triglycerides: 213 mg/dL — ABNORMAL HIGH (ref <150)

## 2017-07-13 LAB — HEMOGLOBIN A1C
EAG (MMOL/L): 6.5 (calc)
HEMOGLOBIN A1C: 5.7 %{Hb} — AB (ref <5.7)
Mean Plasma Glucose: 117 (calc)

## 2017-07-17 ENCOUNTER — Ambulatory Visit (INDEPENDENT_AMBULATORY_CARE_PROVIDER_SITE_OTHER): Payer: Medicare HMO | Admitting: Osteopathic Medicine

## 2017-07-17 ENCOUNTER — Encounter: Payer: Self-pay | Admitting: Osteopathic Medicine

## 2017-07-17 VITALS — BP 114/64 | HR 65 | Wt 189.0 lb

## 2017-07-17 DIAGNOSIS — D0472 Carcinoma in situ of skin of left lower limb, including hip: Secondary | ICD-10-CM

## 2017-07-17 MED ORDER — ATORVASTATIN CALCIUM 80 MG PO TABS: 80.0000 mg | ORAL_TABLET | Freq: Every day | ORAL | 3 refills | Status: DC

## 2017-07-17 NOTE — Progress Notes (Signed)
HPI: Brad James is a 83 y.o. male with past history of  has a past medical history of CAD (coronary artery disease) (01/04/2014); Cancer ([place]); Carotid artery stenosis (01/04/2014); Dizzy spells; Essential hypertension, benign (01/04/2014); Glaucoma; Hyperlipidemia (01/04/2014); Hypertension; and Insomnia (01/04/2014).  who presents to [facility] today, 07/17/17,  for chief complaint of:  Chief Complaint  Patient presents with  . Suture / Staple Removal   Suture removal - no redness/dischare, pain controlled    Past medical history, surgical history, social history and family history reviewed.   Current medication list and allergy/intolerance information reviewed.     Review of Systems:  Constitutional: No recent illness  Skin: No  Rash   Exam:  BP 114/64   Pulse 65   Wt 189 lb (85.7 kg)   BMI 25.63 kg/m   Constitutional: VS see above. General Appearance: alert, well-developed, well-nourished, NAD  Neck: No masses, trachea midline.   Respiratory: Normal respiratory effort.   Neurological: Normal balance/coordination. No tremor.  Skin: warm, dry, intact except healing post-biopsy. Small area centrally where scarring incomplete - dermabond applied after sutures removed.   Psychiatric: Normal judgment/insight. Normal mood and affect. Oriented x3.        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  Visit summary with medication list and pertinent instructions was printed for patient to review, alert [place] if any changes needed. All questions at time of visit were answered - patient instructed to contact office with any additional concerns. ER/RTC precautions were reviewed with the patient and understanding verbalized.     Please note: voice recognition software was  used to produce this document, and typos may escape review. Please contact me for any needed clarifications.

## 2017-09-04 ENCOUNTER — Other Ambulatory Visit: Payer: Self-pay | Admitting: Osteopathic Medicine

## 2017-09-06 ENCOUNTER — Other Ambulatory Visit: Payer: Self-pay | Admitting: Osteopathic Medicine

## 2017-09-14 ENCOUNTER — Other Ambulatory Visit: Payer: Self-pay | Admitting: Cardiovascular Disease

## 2017-09-19 ENCOUNTER — Encounter: Payer: Self-pay | Admitting: Osteopathic Medicine

## 2017-09-19 ENCOUNTER — Ambulatory Visit: Payer: Medicare HMO | Admitting: Osteopathic Medicine

## 2017-09-19 VITALS — BP 137/80 | HR 59 | Wt 185.0 lb

## 2017-09-19 DIAGNOSIS — Z79899 Other long term (current) drug therapy: Secondary | ICD-10-CM

## 2017-09-19 DIAGNOSIS — R42 Dizziness and giddiness: Secondary | ICD-10-CM

## 2017-09-19 DIAGNOSIS — M545 Low back pain, unspecified: Secondary | ICD-10-CM

## 2017-09-19 DIAGNOSIS — R55 Syncope and collapse: Secondary | ICD-10-CM | POA: Diagnosis not present

## 2017-09-19 DIAGNOSIS — I6523 Occlusion and stenosis of bilateral carotid arteries: Secondary | ICD-10-CM | POA: Diagnosis not present

## 2017-09-19 DIAGNOSIS — G8929 Other chronic pain: Secondary | ICD-10-CM | POA: Diagnosis not present

## 2017-09-19 DIAGNOSIS — I10 Essential (primary) hypertension: Secondary | ICD-10-CM

## 2017-09-19 DIAGNOSIS — R001 Bradycardia, unspecified: Secondary | ICD-10-CM

## 2017-09-19 DIAGNOSIS — I4460 Unspecified fascicular block: Secondary | ICD-10-CM | POA: Diagnosis not present

## 2017-09-19 DIAGNOSIS — R0989 Other specified symptoms and signs involving the circulatory and respiratory systems: Secondary | ICD-10-CM | POA: Diagnosis not present

## 2017-09-19 DIAGNOSIS — Z1329 Encounter for screening for other suspected endocrine disorder: Secondary | ICD-10-CM | POA: Diagnosis not present

## 2017-09-19 DIAGNOSIS — R7989 Other specified abnormal findings of blood chemistry: Secondary | ICD-10-CM | POA: Diagnosis not present

## 2017-09-19 MED ORDER — OMEPRAZOLE 20 MG PO CPDR: 20.0000 mg | DELAYED_RELEASE_CAPSULE | Freq: Every day | ORAL | 3 refills | Status: DC

## 2017-09-19 MED ORDER — METOPROLOL SUCCINATE ER 25 MG PO TB24: 25.0000 mg | ORAL_TABLET | Freq: Two times a day (BID) | ORAL | 1 refills | Status: DC

## 2017-09-19 NOTE — Patient Instructions (Signed)
Plan:  Will reduce metoprolol dose to 25 mg twice daily for a week, then come see me   Will get some heart tests, and will follow up with Dr Mauricio Po

## 2017-09-19 NOTE — Progress Notes (Addendum)
HPI: Brad James is a 84 y.o. male who  has a past medical history of CAD (coronary artery disease) (01/04/2014), Cancer [facility]), Carotid artery stenosis (01/04/2014), Dizzy spells, Essential hypertension, benign (01/04/2014), Glaucoma, Hyperlipidemia (01/04/2014), Hypertension, and Insomnia (01/04/2014).  he presents to [facility] today, 09/19/17,  for chief complaint of: dizziness, near fainting   Patient states that he has dealt with dizziness for many years, typically he just lives with it.  Overall he is not particularly concerned about dizziness symptoms, but recent episode where he had a near fall has him a bit worried   Describes episodes where he goes from seated to standing and feels a bit lightheaded, no room spinning sensation, just feels a bit faint.  No headaches or vision changes.  No palpitations or chest pain, no shortness of breath.  Recently on 1 of these episodes he got up felt a bit lightheaded kept walking and then actually fell, he denies ever losing consciousness, no vision blacking out.  Just felt very weak and off balance    Past medical, surgical, social and family history reviewed:  Patient Active Problem List   Diagnosis Date Noted  . Primary osteoarthritis of both ankles 05/21/2017  . Abnormal skin growth 08/07/2016  . History of seizure 07/09/2016  . Low back pain 07/02/2016  . Primary open angle glaucoma 03/19/2016  . Carotid stenosis 03/12/2016  . Pulmonary nodules 06/08/2015  . Gout 04/18/2015  . Skin cancer 07/22/2014  . Nodular basal cell carcinoma 06/10/2014  . Squamous cell carcinoma in situ 06/10/2014  . Renal artery stenosis ([place]) 02/22/2014  . Chronic pancreatitis ([place]) 02/22/2014  . SMA stenosis ([place]) 02/22/2014  . Primary open angle glaucoma of both eyes 02/22/2014  . History of basal cell carcinoma 02/22/2014  . CAD (coronary artery disease) 01/04/2014  . Essential hypertension, benign 01/04/2014  .  Hyperlipidemia 01/04/2014  . Insomnia 01/04/2014  . Carotid artery stenosis 01/04/2014  . Epilepsy ([place]) 01/04/2014    Past Surgical History:  Procedure Laterality Date  . COLONOSCOPY    . CORONARY ANGIOPLASTY WITH STENT PLACEMENT     stated 3-4 over past 10 yrs.   [name] ENDARTERECTOMY Left 04/18/2016   Procedure: LEFT CAROTID ENDARTERECTOMY;  Surgeon: Serafina Mitchell, MD;  Location: [place];  Service: Vascular;  Laterality: Left;  [name] MELANOMA EXCISION    . PATCH ANGIOPLASTY Left 04/18/2016   Procedure: PATCH ANGIOPLASTY USING Rueben Bash BIOLOGIC PATCH;  Surgeon: Serafina Mitchell, MD;  Location: [facility] OR;  Service: Vascular;  Laterality: Left;    Social History   Tobacco Use  . Smoking status: Former Smoker    Last attempt to quit: 01/04/1969    Years since quitting: 48.7  . Smokeless tobacco: Never Used  Substance Use Topics  . Alcohol use: No    Family History  Problem Relation Age of Onset  . Heart disease Mother   . Cancer Father 41       oral  . Stroke Brother 90     Current medication list and allergy/intolerance information reviewed:    Current Outpatient Medications  Medication Sig Dispense Refill  . amLODipine (NORVASC) 10 MG tablet Take 1 tablet (10 mg total) by mouth daily. 30 tablet 11  . aspirin EC 81 MG EC tablet Take 1 tablet (81 mg total) by mouth daily. 90 tablet 3  . atorvastatin (LIPITOR) 80 MG tablet Take 1 tablet (80 mg total) by mouth daily. 90 tablet 3  . atorvastatin (LIPITOR) 80  MG tablet TAKE 1 TABLET BY MOUTH ONCE DAILY. NEED  APPOINTMENT  FOR  FURTHER  REFILLS 90 tablet 2  . brinzolamide (AZOPT) 1 % ophthalmic suspension Place 1 drop into both eyes 2 (two) times daily. 10 mL 1  . ferrous sulfate 325 (65 FE) MG EC tablet Take 325 mg by mouth daily with breakfast.    . lisinopril (PRINIVIL,ZESTRIL) 20 MG tablet Take 1 tablet (20 mg total) by mouth 2 (two) times daily. 180 tablet 1  . meloxicam (MOBIC) 15 MG tablet One tab PO qAM with breakfast for 2 weeks, then  daily prn pain. 30 tablet 3  . metoprolol succinate (TOPROL-XL) 50 MG 24 hr tablet Take 1 tablet (50 mg total) by mouth 2 (two) times daily. 180 tablet 3  . Multiple Vitamin (MULTIVITAMIN) tablet Take 1 tablet by mouth daily.    [name] omeprazole (PRILOSEC) 20 MG capsule Take 1 capsule (20 mg total) by mouth daily. 90 capsule 3  . PHENobarbital (LUMINAL) 32.4 MG tablet Take 2 tablets (64.8 mg total) by mouth at bedtime. 60 tablet 5  . Probiotic Product (PROBIOTIC PO) Take 1 tablet by mouth daily.    [name] triamcinolone ointment (KENALOG) 0.5 % Apply 1 application topically 2 (two) times daily. To affected area/rash, avoid eyes and face 30 g 1  . zolpidem (AMBIEN) 10 MG tablet TAKE 1 TABLET BY MOUTH ONCE DAILY AS NEEDED FOR SLEEP 30 tablet 3  . zolpidem (AMBIEN) 5 MG tablet TAKE 1/2 TABLET BY MOUTH ONCE DAILY AS NEEDED FOR SLEEP 90 tablet 0   No current facility-administered medications for this visit.     No Known Allergies    Review of Systems:  Constitutional:  No  fever, no chills, No recent illness, No unintentional weight changes. No significant fatigue.   HEENT: No  headache, no vision change, no hearing change  Cardiac: No  chest pain, No  pressure, No palpitations, No  Orthopnea  Respiratory:  No  shortness of breath. No  Cough  Gastrointestinal: No  abdominal pain, No  nausea  Musculoskeletal: No new myalgia/arthralgia  Skin: No  Rash  Endocrine: No cold intolerance,  No heat intolerance. No polyuria/polydipsia/polyphagia   Neurologic: No  weakness, +dizziness, No  slurred speech/focal weakness/facial droop  Psychiatric: No  concerns with depression, No  concerns with anxiety,   Exam:  BP 137/80   Pulse (!) 59   Wt 185 lb 0.6 oz (83.9 kg)   BMI 25.10 kg/m    Orthostatics: Lying - 190/86, HR 56 Sitting - 202/80, HR 57 Standing - 188/93, HR 58   Constitutional: VS see above. General Appearance: alert, well-developed, well-nourished, NAD  Eyes: Normal lids and  conjunctive, non-icteric sclera  Ears, Nose, Mouth, Throat: MMM, Normal external inspection ears/nares/mouth/lips/gums. TM normal bilaterally. Pharynx/tonsils no erythema, no exudate. Nasal mucosa normal.   Neck: No masses, trachea midline. No thyroid enlargement. No tenderness/mass appreciated. No lymphadenopathy  Respiratory: Normal respiratory effort. no wheeze, no rhonchi, no rales  Cardiovascular: S1/S2 normal, no murmur, no rub/gallop auscultated. RRR. No lower extremity edema. Pedal pulse II/IV bilaterally DP and PT. No carotid bruit or JVD. No abdominal aortic bruit.  Gastrointestinal: Nontender, no masses. No hepatomegaly, no splenomegaly. No hernia appreciated. Bowel sounds normal. Rectal exam deferred.   Musculoskeletal: Gait normal. No clubbing/cyanosis of digits.   Neurological: Normal balance/coordination. No tremor. No cranial nerve deficit on limited exam. Motor and sensation intact and symmetric. Cerebellar reflexes intact.   Skin: warm, dry, intact. No rash/ulcer. No  concerning nevi or subq nodules on limited exam.    Psychiatric: Normal judgment/insight. Normal mood and affect. Oriented x3.    No results found for this or any previous visit (from the past 72 hour(s)).  No results found.   EKG interpretation: Rate: 53 Rhythm: sinus Bradycardia, conduction delay No ST/T changes concerning for acute ischemia/infarct  Previous EKG similar 03/2016    ASSESSMENT/PLAN:   Postural dizziness with presyncope - Sounds orthostatic in nature.  Complicated by history of coronary artery disease, labile blood pressures.  Trial decrease/stop metoprolol - Plan: EKG 12-Lead, CBC, COMPLETE METABOLIC PANEL WITH GFR, TSH, Urinalysis, Routine w reflex microscopic, Phenobarbital level, ECHOCARDIOGRAM COMPLETE, VAS US CAROTID, Holter monitor - 48 hour  Bilateral carotid artery stenosis - Go ahead and move up currently scheduled carotid artery ultrasound  Essential hypertension,  benign - To enlist the help of cardiology for medication management/second opinion.  Blood pressure is all over the place, 137/80 on intake, up into 200s on orthostatics  Labile blood pressure - He is very nervous to come down on medications due to concern for blood pressure getting too high.  I think we at least need to reduce metoprolol  Sinus bradycardia by electrocardiogram - We will get Holter as well, may be dropping HR to syncope/presyncope?  May need to consider sick sinus syndrome/pacemaker placement - see what cardio says  Fascicular block  Polypharmacy - Discussed multiple sedating medications as risky.  He is on lower dose Ambien.  He is very afraid to stop the phenobarbital    Patient Instructions  Plan:  Will reduce metoprolol dose to 25 mg twice daily for a week, then come see me   Will get some heart tests, and will follow up with Dr Mauricio Po      Visit summary with medication list and pertinent instructions was printed for patient to review. All questions at time of visit were answered - patient instructed to contact office with any additional concerns. ER/RTC precautions were reviewed with the patient.   Follow-up plan: Return in about 1 week (around 09/26/2017) for recheck blood pressure .  Note: Total time spent 40 minutes, greater than 50% of the visit was spent face-to-face counseling and coordinating care for the following: The primary encounter diagnosis was Postural dizziness with presyncope. Diagnoses of Bilateral carotid artery stenosis, Essential hypertension, benign, Labile blood pressure, Sinus bradycardia by electrocardiogram, and Fascicular block were also pertinent to this visit.[name]  Please note: voice recognition software was used to produce this document, and typos may escape review. Please contact Dr. Sheppard Coil for any needed clarifications.

## 2017-09-21 LAB — URINALYSIS, ROUTINE W REFLEX MICROSCOPIC
BILIRUBIN URINE: NEGATIVE
Bacteria, UA: NONE SEEN /HPF
GLUCOSE, UA: NEGATIVE
HGB URINE DIPSTICK: NEGATIVE
Hyaline Cast: NONE SEEN /LPF
Leukocytes, UA: NEGATIVE
NITRITE: NEGATIVE
PH: 6.5 (ref 5.0–8.0)
RBC / HPF: NONE SEEN /HPF (ref 0–2)
Specific Gravity, Urine: 1.02 (ref 1.001–1.03)
Squamous Epithelial / LPF: NONE SEEN /HPF (ref <=5)
WBC UA: NONE SEEN /HPF (ref 0–5)

## 2017-09-21 LAB — COMPLETE METABOLIC PANEL WITH GFR
AG Ratio: 1.6 (calc) (ref 1.0–2.5)
ALT: 12 U/L (ref 9–46)
AST: 18 U/L (ref 10–35)
Albumin: 4.4 g/dL (ref 3.6–5.1)
Alkaline phosphatase (APISO): 90 U/L (ref 40–115)
BUN/Creatinine Ratio: 16 (calc) (ref 6–22)
BUN: 23 mg/dL (ref 7–25)
CALCIUM: 9.9 mg/dL (ref 8.6–10.3)
CO2: 31 mmol/L (ref 20–32)
CREATININE: 1.47 mg/dL — AB (ref 0.70–1.11)
Chloride: 104 mmol/L (ref 98–110)
GFR, EST NON AFRICAN AMERICAN: 42 mL/min/{1.73_m2} — AB (ref >=60)
GFR, Est African American: 49 mL/min/{1.73_m2} — ABNORMAL LOW (ref >=60)
GLUCOSE: 92 mg/dL (ref 65–99)
Globulin: 2.8 g/dL (calc) (ref 1.9–3.7)
Potassium: 4.4 mmol/L (ref 3.5–5.3)
Sodium: 142 mmol/L (ref 135–146)
Total Bilirubin: 0.3 mg/dL (ref 0.2–1.2)
Total Protein: 7.2 g/dL (ref 6.1–8.1)

## 2017-09-21 LAB — CBC
HCT: 42 % (ref 38.5–50.0)
Hemoglobin: 14.4 g/dL (ref 13.2–17.1)
MCH: 32.4 pg (ref 27.0–33.0)
MCHC: 34.3 g/dL (ref 32.0–36.0)
MCV: 94.6 fL (ref 80.0–100.0)
MPV: 9.3 fL (ref 7.5–12.5)
PLATELETS: 300 10*3/uL (ref 140–400)
RBC: 4.44 10*6/uL (ref 4.20–5.80)
RDW: 12.3 % (ref 11.0–15.0)
WBC: 9.2 10*3/uL (ref 3.8–10.8)

## 2017-09-21 LAB — PHENOBARBITAL LEVEL: Phenobarbital, Serum: 10.1 mg/L — ABNORMAL LOW (ref 15.0–40.0)

## 2017-09-21 LAB — TSH: TSH: 2.18 m[IU]/L (ref 0.40–4.50)

## 2017-09-23 DIAGNOSIS — I2510 Atherosclerotic heart disease of native coronary artery without angina pectoris: Secondary | ICD-10-CM | POA: Diagnosis not present

## 2017-09-23 DIAGNOSIS — I10 Essential (primary) hypertension: Secondary | ICD-10-CM | POA: Diagnosis not present

## 2017-09-23 DIAGNOSIS — Z955 Presence of coronary angioplasty implant and graft: Secondary | ICD-10-CM | POA: Diagnosis not present

## 2017-09-23 DIAGNOSIS — R42 Dizziness and giddiness: Secondary | ICD-10-CM | POA: Diagnosis not present

## 2017-09-23 DIAGNOSIS — R55 Syncope and collapse: Secondary | ICD-10-CM | POA: Diagnosis not present

## 2017-09-24 ENCOUNTER — Ambulatory Visit: Payer: Medicare HMO | Admitting: Osteopathic Medicine

## 2017-09-26 ENCOUNTER — Encounter: Payer: Self-pay | Admitting: Osteopathic Medicine

## 2017-09-26 ENCOUNTER — Ambulatory Visit (INDEPENDENT_AMBULATORY_CARE_PROVIDER_SITE_OTHER): Payer: Medicare HMO | Admitting: Osteopathic Medicine

## 2017-09-26 VITALS — BP 128/62 | HR 65 | Wt 186.1 lb

## 2017-09-26 DIAGNOSIS — R55 Syncope and collapse: Secondary | ICD-10-CM

## 2017-09-26 DIAGNOSIS — I10 Essential (primary) hypertension: Secondary | ICD-10-CM | POA: Diagnosis not present

## 2017-09-26 DIAGNOSIS — R42 Dizziness and giddiness: Secondary | ICD-10-CM

## 2017-09-26 NOTE — Progress Notes (Signed)
HPI: Brad James is a 84 y.o. male who  has a past medical history of CAD (coronary artery disease) (01/04/2014), Cancer [facility]), Carotid artery stenosis (01/04/2014), Dizzy spells, Essential hypertension, benign (01/04/2014), Glaucoma, Hyperlipidemia (01/04/2014), Hypertension, and Insomnia (01/04/2014).  he presents to [facility] today, 09/26/17,  for chief complaint of:  Chief Complaint  Patient presents with  . Hypertension    Cardiology notes reviewed: concerned that dizziness may be due to uncontrolled hypertension. Pt states BP is higher at night, AM typically ok. No additional presyncopal episodes.      Past medical, surgical, social and family history reviewed:  Patient Active Problem List   Diagnosis Date Noted  . Primary osteoarthritis of both ankles 05/21/2017  . Abnormal skin growth 08/07/2016  . History of seizure 07/09/2016  . Low back pain 07/02/2016  . Primary open angle glaucoma 03/19/2016  . Carotid stenosis 03/12/2016  . Pulmonary nodules 06/08/2015  . Gout 04/18/2015  . Skin cancer 07/22/2014  . Nodular basal cell carcinoma 06/10/2014  . Squamous cell carcinoma in situ 06/10/2014  . Renal artery stenosis ([place]) 02/22/2014  . Chronic pancreatitis ([place]) 02/22/2014  . SMA stenosis ([place]) 02/22/2014  . Primary open angle glaucoma of both eyes 02/22/2014  . History of basal cell carcinoma 02/22/2014  . CAD (coronary artery disease) 01/04/2014  . Essential hypertension, benign 01/04/2014  . Hyperlipidemia 01/04/2014  . Insomnia 01/04/2014  . Carotid artery stenosis 01/04/2014  . Epilepsy ([place]) 01/04/2014    Past Surgical History:  Procedure Laterality Date  . COLONOSCOPY    . CORONARY ANGIOPLASTY WITH STENT PLACEMENT     stated 3-4 over past 10 yrs.   [name] ENDARTERECTOMY Left 04/18/2016   Procedure: LEFT CAROTID ENDARTERECTOMY;  Surgeon: Serafina Mitchell, MD;  Location: [place];  Service: Vascular;  Laterality: Left;  [name] MELANOMA  EXCISION    . PATCH ANGIOPLASTY Left 04/18/2016   Procedure: PATCH ANGIOPLASTY USING Rueben Bash BIOLOGIC PATCH;  Surgeon: Serafina Mitchell, MD;  Location: [facility] OR;  Service: Vascular;  Laterality: Left;    Social History   Tobacco Use  . Smoking status: Former Smoker    Last attempt to quit: 01/04/1969    Years since quitting: 48.7  . Smokeless tobacco: Never Used  Substance Use Topics  . Alcohol use: No    Family History  Problem Relation Age of Onset  . Heart disease Mother   . Cancer Father 44       oral  . Stroke Brother 58     Current medication list and allergy/intolerance information reviewed:    Current Outpatient Medications  Medication Sig Dispense Refill  . amLODipine (NORVASC) 10 MG tablet Take 1 tablet (10 mg total) by mouth daily. 30 tablet 11  . aspirin EC 81 MG EC tablet Take 1 tablet (81 mg total) by mouth daily. 90 tablet 3  . aspirin EC 81 MG tablet Take by mouth.    [name] atorvastatin (LIPITOR) 80 MG tablet Take 1 tablet (80 mg total) by mouth daily. 90 tablet 3  . brinzolamide (AZOPT) 1 % ophthalmic suspension Place 1 drop into both eyes 2 (two) times daily. 10 mL 1  . ferrous sulfate 325 (65 FE) MG EC tablet Take 325 mg by mouth daily with breakfast.    . lisinopril (PRINIVIL,ZESTRIL) 20 MG tablet Take 1 tablet (20 mg total) by mouth 2 (two) times daily. 180 tablet 1  . meloxicam (MOBIC) 15 MG tablet One tab PO qAM with breakfast  for 2 weeks, then daily prn pain. 30 tablet 3  . metoprolol succinate (TOPROL-XL) 25 MG 24 hr tablet Take 1 tablet (25 mg total) by mouth 2 (two) times daily. 60 tablet 1  . Multiple Vitamin (MULTIVITAMIN) tablet Take 1 tablet by mouth daily.    [name] omeprazole (PRILOSEC) 20 MG capsule Take 1 capsule (20 mg total) by mouth daily. 90 capsule 3  . PHENobarbital (LUMINAL) 32.4 MG tablet Take 2 tablets (64.8 mg total) by mouth at bedtime. 60 tablet 5  . Probiotic Product (PROBIOTIC PO) Take 1 tablet by mouth daily.    [name] zolpidem (AMBIEN) 5 MG  tablet TAKE 1/2 TABLET BY MOUTH ONCE DAILY AS NEEDED FOR SLEEP 90 tablet 0  . dorzolamide (TRUSOPT) 2 % ophthalmic solution INSTILL 1 DROP INTO EACH EYE 3 TIMES A DAY  6  . latanoprost (XALATAN) 0.005 % ophthalmic solution     . OMEPRAZOLE PO Take by mouth.     No current facility-administered medications for this visit.     No Known Allergies    Review of Systems:  Constitutional:  No  fever, no chills,  Cardiac: No  chest pain, No  pressure, No palpitations, No  Orthopnea  Respiratory:  No  shortness of breath. No  Cough  Neurologic: No  weakness, +occasional dizziness, No  slurred speech/focal weakness/facial droop    Exam:  BP 128/62   Pulse 65   Wt 186 lb 1.3 oz (84.4 kg)   BMI 25.24 kg/m   Constitutional: VS see above. General Appearance: alert, well-developed, well-nourished, NAD  Neck: No masses, trachea midline.   Respiratory: Normal respiratory effort. no wheeze, no rhonchi, no rales  Cardiovascular: S1/S2 normal, no murmur, no rub/gallop auscultated. RRR. No lower extremity edema.   Neurological: Normal balance/coordination. No tremor.     Psychiatric: Normal judgment/insight. Normal mood and affect. Oriented x3.    ASSESSMENT/PLAN:   Essential hypertension, benign - Following with cardiology, defer to their management.  Postural dizziness with presyncope - No additional episodes at this point, monitor closely      Visit summary with medication list and pertinent instructions was printed for patient to review. All questions at time of visit were answered - patient instructed to contact office with any additional concerns. ER/RTC precautions were reviewed with the patient.   Follow-up plan: Return in about 6 months (around 03/26/2018) for BP check and routine monitoring .  Note: Total time spent 15 minutes, greater than 50% of the visit was spent face-to-face counseling and coordinating care for the following: The primary encounter diagnosis was  Essential hypertension, benign. A diagnosis of Postural dizziness with presyncope was also pertinent to this visit.[name]  Please note: voice recognition software was used to produce this document, and typos may escape review. Please contact Dr. Sheppard Coil for any needed clarifications.

## 2017-10-04 ENCOUNTER — Telehealth: Payer: Self-pay

## 2017-10-04 NOTE — Telephone Encounter (Signed)
Pt called stating that you no longer wanted him to complete a referral you initiated on his last visit, since pt is due to have testing completed w/ Dr Mauricio Po. Pt continues to get calls to schedule an appt. Pls advise, thanks.

## 2017-10-07 NOTE — Telephone Encounter (Signed)
Can let him Know I Canceled the Orders. He Should Hopefully Not Get Anymore Calls.

## 2017-10-07 NOTE — Telephone Encounter (Signed)
Pt has been updated of provider's note.  

## 2017-10-08 ENCOUNTER — Ambulatory Visit (HOSPITAL_BASED_OUTPATIENT_CLINIC_OR_DEPARTMENT_OTHER): Payer: Medicare HMO

## 2017-10-18 ENCOUNTER — Encounter: Payer: Self-pay | Admitting: Osteopathic Medicine

## 2017-10-18 DIAGNOSIS — I083 Combined rheumatic disorders of mitral, aortic and tricuspid valves: Secondary | ICD-10-CM | POA: Diagnosis not present

## 2017-10-18 DIAGNOSIS — R42 Dizziness and giddiness: Secondary | ICD-10-CM | POA: Diagnosis not present

## 2017-10-18 DIAGNOSIS — R55 Syncope and collapse: Secondary | ICD-10-CM | POA: Diagnosis not present

## 2017-10-18 DIAGNOSIS — I517 Cardiomegaly: Secondary | ICD-10-CM | POA: Diagnosis not present

## 2017-10-18 DIAGNOSIS — I10 Essential (primary) hypertension: Secondary | ICD-10-CM | POA: Diagnosis not present

## 2017-10-24 DIAGNOSIS — R55 Syncope and collapse: Secondary | ICD-10-CM | POA: Diagnosis not present

## 2017-10-24 DIAGNOSIS — I10 Essential (primary) hypertension: Secondary | ICD-10-CM | POA: Diagnosis not present

## 2017-10-24 DIAGNOSIS — I2510 Atherosclerotic heart disease of native coronary artery without angina pectoris: Secondary | ICD-10-CM | POA: Diagnosis not present

## 2017-10-24 DIAGNOSIS — R42 Dizziness and giddiness: Secondary | ICD-10-CM | POA: Diagnosis not present

## 2017-10-24 DIAGNOSIS — I739 Peripheral vascular disease, unspecified: Secondary | ICD-10-CM | POA: Diagnosis not present

## 2017-10-24 DIAGNOSIS — I340 Nonrheumatic mitral (valve) insufficiency: Secondary | ICD-10-CM | POA: Diagnosis not present

## 2017-10-24 DIAGNOSIS — Z955 Presence of coronary angioplasty implant and graft: Secondary | ICD-10-CM | POA: Diagnosis not present

## 2017-10-24 DIAGNOSIS — I701 Atherosclerosis of renal artery: Secondary | ICD-10-CM | POA: Diagnosis not present

## 2017-11-08 ENCOUNTER — Ambulatory Visit (INDEPENDENT_AMBULATORY_CARE_PROVIDER_SITE_OTHER): Payer: Medicare HMO

## 2017-11-08 ENCOUNTER — Encounter: Payer: Self-pay | Admitting: Sports Medicine

## 2017-11-08 ENCOUNTER — Ambulatory Visit: Payer: Medicare HMO | Admitting: Sports Medicine

## 2017-11-08 DIAGNOSIS — R109 Unspecified abdominal pain: Secondary | ICD-10-CM | POA: Diagnosis not present

## 2017-11-08 DIAGNOSIS — R10A1 Flank pain, right side: Secondary | ICD-10-CM | POA: Insufficient documentation

## 2017-11-08 DIAGNOSIS — N2889 Other specified disorders of kidney and ureter: Secondary | ICD-10-CM | POA: Diagnosis not present

## 2017-11-08 MED ORDER — CIPROFLOXACIN HCL 750 MG PO TABS: 750.0000 mg | ORAL_TABLET | Freq: Two times a day (BID) | ORAL | 0 refills | Status: AC

## 2017-11-08 NOTE — Addendum Note (Signed)
Addended by: Silverio Decamp on: 11/08/2017 04:50 PM   Modules accepted: Orders

## 2017-11-08 NOTE — Progress Notes (Addendum)
Subjective:    I'm seeing this patient as a consultation for: Dr. Emeterio [name]  CC: Back pain  HPI: For the past several days this pleasant 84 year old male has had severe, worsening pain in his right low back, at the right costovertebral angle with radiation to the right lateral flank, abdomen but not quite to the groin.  Has not noted any constitutional symptoms, no bowel or bladder dysfunction, no hematuria.  I reviewed the past medical history, family history, social history, surgical history, and allergies today and no changes were needed.  Please see the problem list section below in epic for further details.  Past Medical History: Past Medical History:  Diagnosis Date  . CAD (coronary artery disease) 01/04/2014  . Cancer ([place])    skin  . Carotid artery stenosis 01/04/2014  . Dizzy spells    At times per pt  . Essential hypertension, benign 01/04/2014  . Glaucoma    Bilateral  . Hyperlipidemia 01/04/2014  . Hypertension   . Insomnia 01/04/2014   Past Surgical History: Past Surgical History:  Procedure Laterality Date  . COLONOSCOPY    . CORONARY ANGIOPLASTY WITH STENT PLACEMENT     stated 3-4 over past 10 yrs.   [name] ENDARTERECTOMY Left 04/18/2016   Procedure: LEFT CAROTID ENDARTERECTOMY;  Surgeon: Serafina Mitchell, MD;  Location: [place];  Service: Vascular;  Laterality: Left;  [name] MELANOMA EXCISION    . PATCH ANGIOPLASTY Left 04/18/2016   Procedure: PATCH ANGIOPLASTY USING Rueben Bash BIOLOGIC PATCH;  Surgeon: Serafina Mitchell, MD;  Location: [facility] OR;  Service: Vascular;  Laterality: Left;   Social History: Social History   Socioeconomic History  . Marital status: Married    Spouse name: None  . Number of children: None  . Years of education: None  . Highest education level: None  Social Needs  . Financial resource strain: None  . Food insecurity - worry: None  . Food insecurity - inability: None  . Transportation needs - medical: None  . Transportation needs -  non-medical: None  Occupational History  . None  Tobacco Use  . Smoking status: Former Smoker    Last attempt to quit: 01/04/1969    Years since quitting: 48.8  . Smokeless tobacco: Never Used  Substance and Sexual Activity  . Alcohol use: No  . Drug use: No  . Sexual activity: Not Currently    Partners: Female  Other Topics Concern  . None  Social History Narrative  . None   Family History: Family History  Problem Relation Age of Onset  . Heart disease Mother   . Cancer Father 1       oral  . Stroke Brother 51   Allergies: No Known Allergies Medications: See med rec.  Review of Systems: No headache, visual changes, nausea, vomiting, diarrhea, constipation, dizziness, abdominal pain, skin rash, fevers, chills, night sweats, weight loss, swollen lymph nodes, body aches, joint swelling, muscle aches, chest pain, shortness of breath, mood changes, visual or auditory hallucinations.   Objective:   General: Well Developed, well nourished, and in no acute distress.  Neuro:  Extra-ocular muscles intact, able to move all 4 extremities, sensation grossly intact.  Deep tendon reflexes tested were normal. Psych: Alert and oriented, mood congruent with affect. ENT:  Ears and nose appear unremarkable.  Hearing grossly normal. Neck: Unremarkable overall appearance, trachea midline.  No visible thyroid enlargement. Eyes: Conjunctivae and lids appear unremarkable.  Pupils equal and round. Skin: Warm and dry, no rashes noted.  Cardiovascular: Pulses palpable, no extremity edema. Abdomen: Soft, tender to palpation over the right costovertebral angle, nondistended, no palpable masses, no guarding, rigidity, rebound tenderness.  Impression and Recommendations:   This case required medical decision making of moderate complexity.  Right flank pain Suspect nephrolithiasis. At this point unable to obtain a urinalysis, we have given several cups to drink, still has no urine. He voided right  before he came. Adding a urinary ultrasound, will add Flomax and ciprofloxacin if positive for nephrolithiasis.  Adding ciprofloxacin, some routine labs.  ___________________________________________ Gwen Her. Dianah Field, M.D., ABFM., CAQSM. Primary Care and [place] Instructor of [place] of [facility] [employer]

## 2017-11-08 NOTE — Assessment & Plan Note (Addendum)
Suspect nephrolithiasis. At this point unable to obtain a urinalysis, we have given several cups to drink, still has no urine. He voided right before he came. Adding a urinary ultrasound, will add Flomax and ciprofloxacin if positive for nephrolithiasis.  Adding ciprofloxacin, some routine labs.

## 2017-11-09 LAB — URINALYSIS
Bilirubin Urine: NEGATIVE
Glucose, UA: NEGATIVE
Hgb urine dipstick: NEGATIVE
Leukocytes, UA: NEGATIVE
Nitrite: NEGATIVE
Specific Gravity, Urine: 1.022 (ref 1.001–1.03)
pH: 5.5 (ref 5.0–8.0)

## 2017-11-09 LAB — URINE CULTURE
MICRO NUMBER:: 90237534
Result:: NO GROWTH
SPECIMEN QUALITY:: ADEQUATE

## 2017-11-14 DIAGNOSIS — I10 Essential (primary) hypertension: Secondary | ICD-10-CM | POA: Diagnosis not present

## 2017-11-14 DIAGNOSIS — I701 Atherosclerosis of renal artery: Secondary | ICD-10-CM | POA: Diagnosis not present

## 2017-11-15 ENCOUNTER — Ambulatory Visit: Payer: Medicare HMO | Admitting: Sports Medicine

## 2017-11-15 ENCOUNTER — Encounter: Payer: Self-pay | Admitting: Sports Medicine

## 2017-11-15 DIAGNOSIS — R109 Unspecified abdominal pain: Secondary | ICD-10-CM

## 2017-11-15 DIAGNOSIS — I150 Renovascular hypertension: Secondary | ICD-10-CM

## 2017-11-15 DIAGNOSIS — R10A1 Flank pain, right side: Secondary | ICD-10-CM

## 2017-11-15 MED ORDER — HYDRALAZINE HCL 25 MG PO TABS
25.0000 mg | ORAL_TABLET | Freq: Three times a day (TID) | ORAL | 0 refills | Status: DC
Start: 2017-11-15 — End: 2017-12-19

## 2017-11-15 NOTE — Assessment & Plan Note (Signed)
Toprol-XL 25 twice a day, lisinopril 20 twice a day. Amlodipine max dose. Avoiding diuretics due to low GFR renal function. Adding hydralazine 25, 3 times a day. He was scheduled for renal artery stenting, but blood pressure was in the 048G systolic so this was canceled. Follow-up blood pressure in 2 weeks either with myself, or his cardiologist.

## 2017-11-15 NOTE — Progress Notes (Signed)
Subjective:    CC: Follow-up  HPI: Right flank pain: Suspected pyelonephritis versus nephrolithiasis, ultrasound was essentially negative with the exception of some bladder debris, urinalysis and urine culture were negative, we did add some Cipro at the time of the initial visit which he is finishing up now, pain is gone.  He did develop some different back pain, worse with sitting, flexion while waiting in the exam room, otherwise he tells me his flank pain is completely resolved.  Renovascular hypertension: With renal artery stenosis, he was scheduled for renal artery stenting, but at the day of the visit his blood pressure was down to 470 systolic.  It was recommended that he try further medical management including  I reviewed the past medical history, family history, social history, surgical history, and allergies today and no changes were needed.  Please see the problem list section below in epic for further details.  Past Medical History: Past Medical History:  Diagnosis Date  . CAD (coronary artery disease) 01/04/2014  . Cancer ([place])    skin  . Carotid artery stenosis 01/04/2014  . Dizzy spells    At times per pt  . Essential hypertension, benign 01/04/2014  . Glaucoma    Bilateral  . Hyperlipidemia 01/04/2014  . Hypertension   . Insomnia 01/04/2014   Past Surgical History: Past Surgical History:  Procedure Laterality Date  . COLONOSCOPY    . CORONARY ANGIOPLASTY WITH STENT PLACEMENT     stated 3-4 over past 10 yrs.   [name] ENDARTERECTOMY Left 04/18/2016   Procedure: LEFT CAROTID ENDARTERECTOMY;  Surgeon: Serafina Mitchell, MD;  Location: [place];  Service: Vascular;  Laterality: Left;  [name] MELANOMA EXCISION    . PATCH ANGIOPLASTY Left 04/18/2016   Procedure: PATCH ANGIOPLASTY USING Rueben Bash BIOLOGIC PATCH;  Surgeon: Serafina Mitchell, MD;  Location: [facility] OR;  Service: Vascular;  Laterality: Left;   Social History: Social History   Socioeconomic History  . Marital status: Married   Spouse name: None  . Number of children: None  . Years of education: None  . Highest education level: None  Social Needs  . Financial resource strain: None  . Food insecurity - worry: None  . Food insecurity - inability: None  . Transportation needs - medical: None  . Transportation needs - non-medical: None  Occupational History  . None  Tobacco Use  . Smoking status: Former Smoker    Last attempt to quit: 01/04/1969    Years since quitting: 48.8  . Smokeless tobacco: Never Used  Substance and Sexual Activity  . Alcohol use: No  . Drug use: No  . Sexual activity: Not Currently    Partners: Female  Other Topics Concern  . None  Social History Narrative  . None   Family History: Family History  Problem Relation Age of Onset  . Heart disease Mother   . Cancer Father 24       oral  . Stroke Brother 59   Allergies: No Known Allergies Medications: See med rec.  Review of Systems: No fevers, chills, night sweats, weight loss, chest pain, or shortness of breath.   Objective:    General: Well Developed, well nourished, and in no acute distress.  Neuro: Alert and oriented x3, extra-ocular muscles intact, sensation grossly intact.  HEENT: Normocephalic, atraumatic, pupils equal round reactive to light, neck supple, no masses, no lymphadenopathy, thyroid nonpalpable.  Skin: Warm and dry, no rashes. Cardiac: Regular rate and rhythm, no murmurs rubs or gallops, no lower extremity  edema.  Respiratory: Clear to auscultation bilaterally. Not using accessory muscles, speaking in full sentences. Back Exam:  Inspection: Unremarkable  Motion: Flexion 45 deg, Extension 45 deg, Side Bending to 45 deg bilaterally,  Rotation to 45 deg bilaterally  SLR laying: Negative  XSLR laying: Negative  Palpable tenderness: None. FABER: negative. Sensory change: Gross sensation intact to all lumbar and sacral dermatomes.  Reflexes: 2+ at both patellar tendons, 2+ at achilles tendons, Babinski's  downgoing.  Strength at foot  Plantar-flexion: 5/5 Dorsi-flexion: 5/5 Eversion: 5/5 Inversion: 5/5  Leg strength  Quad: 5/5 Hamstring: 5/5 Hip flexor: 5/5 Hip abductors: 5/5  Gait unremarkable.  Impression and Recommendations:    Right flank pain Resolved with ciprofloxacin, no change in plan. He is having a new type of back pain that only occurred when sitting outside in her chairs, this sounds more axial and discogenic, adding some rehab exercises. Return as needed for this.  Secondary renovascular hypertension Toprol-XL 25 twice a day, lisinopril 20 twice a day. Amlodipine max dose. Avoiding diuretics due to low GFR renal function. Adding hydralazine 25, 3 times a day. He was scheduled for renal artery stenting, but blood pressure was in the 093G systolic so this was canceled. Follow-up blood pressure in 2 weeks either with myself, or his cardiologist. ___________________________________________ Gwen Her. Dianah Field, M.D., ABFM., CAQSM. Primary Care and [place] Instructor of [place] of [facility] [employer]

## 2017-11-15 NOTE — Assessment & Plan Note (Signed)
Resolved with ciprofloxacin, no change in plan. He is having a new type of back pain that only occurred when sitting outside in her chairs, this sounds more axial and discogenic, adding some rehab exercises. Return as needed for this.

## 2017-11-16 ENCOUNTER — Other Ambulatory Visit: Payer: Self-pay | Admitting: Osteopathic Medicine

## 2017-12-05 DIAGNOSIS — Z955 Presence of coronary angioplasty implant and graft: Secondary | ICD-10-CM | POA: Diagnosis not present

## 2017-12-05 DIAGNOSIS — I701 Atherosclerosis of renal artery: Secondary | ICD-10-CM | POA: Diagnosis not present

## 2017-12-05 DIAGNOSIS — I10 Essential (primary) hypertension: Secondary | ICD-10-CM | POA: Diagnosis not present

## 2017-12-05 DIAGNOSIS — R55 Syncope and collapse: Secondary | ICD-10-CM | POA: Diagnosis not present

## 2017-12-05 DIAGNOSIS — R42 Dizziness and giddiness: Secondary | ICD-10-CM | POA: Diagnosis not present

## 2017-12-05 DIAGNOSIS — I2510 Atherosclerotic heart disease of native coronary artery without angina pectoris: Secondary | ICD-10-CM | POA: Diagnosis not present

## 2017-12-19 ENCOUNTER — Encounter: Payer: Self-pay | Admitting: Physician Assistant

## 2017-12-19 ENCOUNTER — Ambulatory Visit: Payer: Medicare HMO | Admitting: Physician Assistant

## 2017-12-19 VITALS — BP 168/68 | HR 66 | Ht 72.0 in | Wt 188.0 lb

## 2017-12-19 DIAGNOSIS — I701 Atherosclerosis of renal artery: Secondary | ICD-10-CM | POA: Diagnosis not present

## 2017-12-19 DIAGNOSIS — I150 Renovascular hypertension: Secondary | ICD-10-CM

## 2017-12-19 MED ORDER — HYDRALAZINE HCL 25 MG PO TABS
25.0000 mg | ORAL_TABLET | Freq: Two times a day (BID) | ORAL | 1 refills | Status: DC
Start: 2017-12-19 — End: 2018-01-02

## 2017-12-19 NOTE — Patient Instructions (Addendum)
Take out one of hydralazine in the morning but continue to take lunch and before dinner.  Metoprolol one in am and one before dinner.  Lisinopril before dinner both tablets.  norvasc in morning.   mychart next Wednesday for adjustments.  Follow up PCP in 2 weeks.

## 2017-12-19 NOTE — Progress Notes (Signed)
Subjective:    Patient ID: Arvo Ealy, male    DOB: 02-07-1927, 84 y.o.   MRN: [id number]  HPI  Pt is a 84 yo male with HTN, renal artery stenosis who sees Dr. Mauricio Po who is having problems regulating blood pressure. He has really low readings in the am and really high readings at night. This has been going on for a while and multiple adjustments to medication the last by cardiology being to stop taking lisinopril in am and only take at night. He denies any CP, palpitations, headaches. He does feel very dizziness and lightheaded at time in the morning when BP drops. He checks BP at home and in AM can get as low as 90/60 at night can get as high as 170's over 100's.   .. Active Ambulatory Problems    Diagnosis Date Noted  . CAD (coronary artery disease) 01/04/2014  . Secondary renovascular hypertension 01/04/2014  . Hyperlipidemia 01/04/2014  . Insomnia 01/04/2014  . Carotid artery stenosis 01/04/2014  . Epilepsy ([place]) 01/04/2014  . Renal artery stenosis ([employer]) 02/22/2014  . Chronic pancreatitis ([place]) 02/22/2014  . 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 Primary open angle glaucoma of both eyes 02/22/2014  . History of basal cell carcinoma 02/22/2014  . Nodular basal cell carcinoma 06/10/2014  . Squamous cell carcinoma in situ 06/10/2014  . Skin cancer 07/22/2014  . Gout 04/18/2015  . Pulmonary nodules 06/08/2015  . Carotid stenosis 03/12/2016  . Primary open angle glaucoma 03/19/2016  . Low back pain 07/02/2016  . History of seizure 07/09/2016  . Abnormal skin growth 08/07/2016  . Primary osteoarthritis of both ankles 05/21/2017  . Right flank pain 11/08/2017   Resolved Ambulatory Problems    Diagnosis Date Noted  . No Resolved Ambulatory Problems   Past Medical History:  Diagnosis Date  . CAD (coronary artery disease) 01/04/2014  . Cancer ([place])   . Carotid artery stenosis 01/04/2014  . Dizzy spells   . Essential hypertension, benign 01/04/2014  . Glaucoma   . Hyperlipidemia  01/04/2014  . Hypertension   . Insomnia 01/04/2014        Review of Systems    see HPI.  Objective:   Physical Exam  Constitutional: He is oriented to person, place, and time. He appears well-developed and well-nourished.  HENT:  Head: Normocephalic and atraumatic.  Cardiovascular: Normal rate, regular rhythm and normal heart sounds.  Pulmonary/Chest: Effort normal and breath sounds normal. He has no wheezes. He has no rales.  Neurological: He is alert and oriented to person, place, and time.  Skin:  Scant edema around left ankle.   Psychiatric: He has a normal mood and affect. His behavior is normal.          Assessment & Plan:   Marland KitchenMarland KitchenDiagnoses and all orders for this visit:  Renal artery stenosis ([place])  Secondary renovascular hypertension -     hydrALAZINE (APRESOLINE) 25 MG tablet; Take 1 tablet (25 mg total) by mouth 2 (two) times daily.   BP not controlled today but this is an afternoon appt. He was seen last week by cardiology and can see BP was 112's over 70's.   Today I decided to switch up timing medications a bit.   Take metoprolol in am and one before dinner.  Take both lisinopril tablets before dinner.  Drop hydralazine in am but continue lunch and dinner dose.  Continue to take norvasc in the am.   Discussed hydration in the morning. He doesn't drink anything  other than coffee in the am.   If still not where he needs to be need to consider splitting norvasc dose.  Send 1 week report via mychart. Follow up with PCP in 2 weeks.   [name].Spent 30 minutes with patient and greater than 50 percent of visit spent counseling patient regarding treatment plan.

## 2017-12-25 ENCOUNTER — Encounter: Payer: Self-pay | Admitting: Physician Assistant

## 2018-01-01 ENCOUNTER — Other Ambulatory Visit: Payer: Self-pay | Admitting: Osteopathic Medicine

## 2018-01-01 NOTE — Telephone Encounter (Signed)
[facility] requesting med refill for Phenobarbital. If appropriate, pls send refill to [facility]. Thanks

## 2018-01-02 ENCOUNTER — Encounter: Payer: Self-pay | Admitting: Osteopathic Medicine

## 2018-01-02 ENCOUNTER — Ambulatory Visit: Payer: Medicare HMO | Admitting: Osteopathic Medicine

## 2018-01-02 VITALS — BP 154/75 | HR 66 | Temp 98.0°F | Wt 197.6 lb

## 2018-01-02 DIAGNOSIS — I150 Renovascular hypertension: Secondary | ICD-10-CM

## 2018-01-02 DIAGNOSIS — I10 Essential (primary) hypertension: Secondary | ICD-10-CM | POA: Diagnosis not present

## 2018-01-02 MED ORDER — HYDRALAZINE HCL 50 MG PO TABS: 50.0000 mg | ORAL_TABLET | Freq: Two times a day (BID) | ORAL | Status: DC

## 2018-01-02 NOTE — Progress Notes (Signed)
HPI: Brad James is a 84 y.o. male who  has a past medical history of CAD (coronary artery disease) (01/04/2014), Cancer [facility]), Carotid artery stenosis (01/04/2014), Dizzy spells, Essential hypertension, benign (01/04/2014), Glaucoma, Hyperlipidemia (01/04/2014), Hypertension, and Insomnia (01/04/2014).  he presents to [facility] [place] today, 01/02/18,  for chief complaint of:  BP check up  Seen 2 weeks ago by a colleague while I was out. She adjusted BP med regimen a bit to hopefully help with labile BP. Advised "Take metoprolol in am and one before dinner.  Take both lisinopril tablets before dinner. Drop hydralazine in am but continue lunch and dinner dose. Continue to take norvasc in the am." He has done a bit better on this regimen but still reports higher BP in the evenings an dlow in AM.   Reports good BP at home this morning, 130s/60s at home but also up into 170s, occasionally over 200.  Np CP/SOB, no HA/VC. Frequent orthostatic dizziness.   Patient is accompanied by daughter who assists with history-taking.     Past medical history, surgical history, social history and family history reviewed. No updates needed.   Current medication list and allergy/intolerance information reviewed.    Current Outpatient Medications on File Prior to Visit  Medication Sig Dispense Refill  . amLODipine (NORVASC) 10 MG tablet Take 1 tablet (10 mg total) by mouth daily. 30 tablet 11  . aspirin EC 81 MG EC tablet Take 1 tablet (81 mg total) by mouth daily. 90 tablet 3  . atorvastatin (LIPITOR) 80 MG tablet Take 1 tablet (80 mg total) by mouth daily. 90 tablet 3  . brinzolamide (AZOPT) 1 % ophthalmic suspension Place 1 drop into both eyes 2 (two) times daily. 10 mL 1  . dorzolamide (TRUSOPT) 2 % ophthalmic solution INSTILL 1 DROP INTO EACH EYE 3 TIMES A DAY  6  . hydrALAZINE (APRESOLINE) 25 MG tablet Take 1 tablet (25 mg total) by mouth 2 (two) times daily. 60 tablet 1  .  latanoprost (XALATAN) 0.005 % ophthalmic solution     . lisinopril (PRINIVIL,ZESTRIL) 20 MG tablet Take 1 tablet (20 mg total) by mouth 2 (two) times daily. 180 tablet 1  . meloxicam (MOBIC) 15 MG tablet One tab PO qAM with breakfast for 2 weeks, then daily prn pain. 30 tablet 3  . metoprolol succinate (TOPROL-XL) 25 MG 24 hr tablet TAKE 1 TABLET BY MOUTH TWICE DAILY 60 tablet 2  . Multiple Vitamin (MULTIVITAMIN) tablet Take 1 tablet by mouth daily.    [name] PHENobarbital (LUMINAL) 32.4 MG tablet TAKE 2 TABLETS BY MOUTH AT BEDTIME 60 tablet 5  . Probiotic Product (PROBIOTIC PO) Take 1 tablet by mouth daily.    [name] zolpidem (AMBIEN) 5 MG tablet TAKE 1/2 TABLET BY MOUTH ONCE DAILY AS NEEDED FOR SLEEP 90 tablet 0  . ferrous sulfate 325 (65 FE) MG EC tablet Take 325 mg by mouth daily with breakfast.    . omeprazole (PRILOSEC) 20 MG capsule Take 1 capsule (20 mg total) by mouth daily. (Patient not taking: Reported on 01/02/2018) 90 capsule 3  . OMEPRAZOLE PO Take by mouth.     No current facility-administered medications on file prior to visit.    No Known Allergies    Review of Systems:  Constitutional: No recent illness  HEENT: No  headache, no vision change  Cardiac: No  chest pain, No  pressure, No palpitations  Respiratory:  No  shortness of breath. No  Cough  Musculoskeletal:  No new myalgia/arthralgia  Neurologic: No  weakness, +Dizziness   Exam:  BP (!) 154/75 (BP Location: Left Arm, Patient Position: Sitting, Cuff Size: Normal)   Pulse 66   Temp 98 F (36.7 C) (Oral)   Wt 197 lb 9.6 oz (89.6 kg)   BMI 26.80 kg/m   Constitutional: VS see above. General Appearance: alert, well-developed, well-nourished, NAD  Eyes: Normal lids and conjunctive, non-icteric sclera  Ears, Nose, Mouth, Throat: MMM, Normal external inspection ears/nares/mouth/lips/gums.  Neck: No masses, trachea midline.   Respiratory: Normal respiratory effort. no wheeze, no rhonchi, no rales  Cardiovascular:  S1/S2 normal, no murmur, no rub/gallop auscultated. RRR.   Musculoskeletal: Gait normal. Symmetric and independent movement of all extremities  Neurological: Normal balance/coordination. No tremor.  Skin: warm, dry, intact.   Psychiatric: Normal judgment/insight. Normal mood and affect. Oriented x3.     ASSESSMENT/PLAN:   Essential hypertension, benign  Secondary renovascular hypertension - Plan: hydrALAZINE (APRESOLINE) 50 MG tablet   Meds ordered this encounter  Medications  . hydrALAZINE (APRESOLINE) 50 MG tablet    Sig: Take 1 tablet (50 mg total) by mouth 2 (two) times daily. Once around noon and once around bedtime    Patient Instructions  Plan:  Hydralazine - keep timing the same, let's increase dose to improve PM blood pressure   Bring home BP monitor to next visit  Tylenol up to 1000 mg four times per day but no higher than that      Follow-up plan: Return in about 1 week (around 01/09/2018) for recheck BP and confirm home monitor .  Visit summary with medication list and pertinent instructions was printed for patient to review, alert [place] if any changes needed. All questions at time of visit were answered - patient instructed to contact office with any additional concerns. ER/RTC precautions were reviewed with the patient and understanding verbalized.    Please note: voice recognition software was used to produce this document, and typos may escape review. Please contact Dr. Sheppard Coil for any needed clarifications.

## 2018-01-02 NOTE — Patient Instructions (Addendum)
Plan:  Hydralazine - keep timing the same, let's increase dose to improve PM blood pressure   Bring home BP monitor to next visit  Tylenol up to 1000 mg four times per day but no higher than that

## 2018-01-04 ENCOUNTER — Encounter: Payer: Self-pay | Admitting: Osteopathic Medicine

## 2018-01-07 NOTE — Telephone Encounter (Signed)
Pt has been updated.  

## 2018-01-09 ENCOUNTER — Ambulatory Visit (INDEPENDENT_AMBULATORY_CARE_PROVIDER_SITE_OTHER): Payer: Medicare HMO

## 2018-01-09 ENCOUNTER — Encounter: Payer: Self-pay | Admitting: Osteopathic Medicine

## 2018-01-09 ENCOUNTER — Ambulatory Visit: Payer: Medicare HMO | Admitting: Osteopathic Medicine

## 2018-01-09 VITALS — BP 154/74 | HR 57 | Temp 97.3°F | Wt 187.2 lb

## 2018-01-09 DIAGNOSIS — I10 Essential (primary) hypertension: Secondary | ICD-10-CM

## 2018-01-09 DIAGNOSIS — M79645 Pain in left finger(s): Secondary | ICD-10-CM | POA: Diagnosis not present

## 2018-01-09 DIAGNOSIS — I150 Renovascular hypertension: Secondary | ICD-10-CM

## 2018-01-09 DIAGNOSIS — M19042 Primary osteoarthritis, left hand: Secondary | ICD-10-CM

## 2018-01-09 LAB — URIC ACID: Uric Acid, Serum: 8.9 mg/dL — ABNORMAL HIGH (ref 4.0–8.0)

## 2018-01-09 MED ORDER — DICLOFENAC SODIUM 1 % TD GEL: 2.0000 g | Freq: Four times a day (QID) | TRANSDERMAL | 11 refills | Status: AC

## 2018-01-09 MED ORDER — HYDRALAZINE HCL 50 MG PO TABS: 50.0000 mg | ORAL_TABLET | Freq: Two times a day (BID) | ORAL | 1 refills | Status: DC

## 2018-01-09 NOTE — Progress Notes (Signed)
HPI: Brad James is a 84 y.o. male who  has a past medical history of CAD (coronary artery disease) (01/04/2014), Cancer [facility]), Carotid artery stenosis (01/04/2014), Dizzy spells, Essential hypertension, benign (01/04/2014), Glaucoma, Hyperlipidemia (01/04/2014), Hypertension, and Insomnia (01/04/2014).  [facility] 01[facility]09/18,  for chief complaint of:  BP follow-up Finger concern   HTN: Labile BP, higher in the evenings. When it's low he has orthostatic symptoms. We've been struggling to get him on a good BP regimen that keeps pressure relatively stable w/o adverse effects.   Last visit, we decided to increase afternoon/PM dosing Hydralazine from 25 to 50 - he's been taking 50 mg at lunch time and bed time. He's here to follow up on this and to confirm home BP monitor accuracy.   Monitor comparison: Ours: 168/75 His:183/85  Hydralazine: takes at lunch and dinner Amlodipine: takes at lunch Metoprolol: takes at lunch and dinner Lisinopril: takes at lunch and dinner  Finger pain: known arthritis, hx gout. L pinky finger swollen and painful past few days.    Patient is accompanied by daughter who assists with history-taking.   Past medical history, surgical history, and family history reviewed.  Current medication list and allergy/intolerance information reviewed.   (See remainder of HPI, as well as ROS, PE below)    ASSESSMENT/PLAN:   Essential hypertension, benign  Secondary renovascular hypertension - Plan: hydrALAZINE (APRESOLINE) 50 MG tablet  Finger pain, left - Plan: DG Hand Complete Left, Uric acid   Meds ordered this encounter  Medications  . hydrALAZINE (APRESOLINE) 50 MG tablet    Sig: Take 1 tablet (50 mg total) by mouth 2 (two) times daily. Lunch and dinner    Dispense:  180 tablet    Refill:  1  . diclofenac sodium (VOLTAREN) 1 % GEL    Sig: Apply 2 g topically 4 (four) times daily. To affected joint.   Dispense:  100 g    Refill:  11   I think we can leave meds as-is for now Home monitor reads high, his moe readings are probably better than they look, and low in the AM.   Orders Placed This Encounter  Procedures  . DG Hand Complete Left  . Uric acid     Follow-up plan: Return in about 3 months (around 04/10/2018) for follow-up blood pressure, sooner if needed / sports med if finger no better .     ^^^^^^^^^^^^^^^^^^^^^^^^^^^^^^^^^^  Outpatient Encounter Medications as of 01/09/2018  Medication Sig Note  . amLODipine (NORVASC) 10 MG tablet Take 1 tablet (10 mg total) by mouth daily.   [name] aspirin EC 81 MG EC tablet Take 1 tablet (81 mg total) by mouth daily.   [name] atorvastatin (LIPITOR) 80 MG tablet Take 1 tablet (80 mg total) by mouth daily.   . brinzolamide (AZOPT) 1 % ophthalmic suspension Place 1 drop into both eyes 2 (two) times daily.   . dorzolamide (TRUSOPT) 2 % ophthalmic solution INSTILL 1 DROP INTO EACH EYE 3 TIMES A DAY   . ferrous sulfate 325 (65 FE) MG EC tablet Take 325 mg by mouth daily with breakfast. 09/19/2017: PRN  . hydrALAZINE (APRESOLINE) 50 MG tablet Take 1 tablet (50 mg total) by mouth 2 (two) times daily. Once around noon and once around bedtime   . latanoprost (XALATAN) 0.005 % ophthalmic solution    . lisinopril (PRINIVIL,ZESTRIL) 20 MG tablet Take 1 tablet (20 mg total) by mouth 2 (two) times daily.   [name]  meloxicam (MOBIC) 15 MG tablet One tab PO qAM with breakfast for 2 weeks, then daily prn pain. 09/19/2017: PRN  . metoprolol succinate (TOPROL-XL) 25 MG 24 hr tablet TAKE 1 TABLET BY MOUTH TWICE DAILY   . Multiple Vitamin (MULTIVITAMIN) tablet Take 1 tablet by mouth daily.   [name] omeprazole (PRILOSEC) 20 MG capsule Take 1 capsule (20 mg total) by mouth daily. (Patient not taking: Reported on 01/02/2018)   . OMEPRAZOLE PO Take by mouth.   [name] PHENobarbital (LUMINAL) 32.4 MG tablet TAKE 2 TABLETS BY MOUTH AT BEDTIME   . Probiotic Product (PROBIOTIC PO) Take 1 tablet by  mouth daily.   [name] zolpidem (AMBIEN) 5 MG tablet TAKE 1/2 TABLET BY MOUTH ONCE DAILY AS NEEDED FOR SLEEP    No facility-administered encounter medications on file as of 01/09/2018.    No Known Allergies    Review of Systems:  Constitutional: No recent illness  HEENT: No  headache, no vision change  Cardiac: No  chest pain, No  pressure, No palpitations  Respiratory:  No  Shortness of breath. No  Cough  Gastrointestinal: No  abdominal pain, no change on bowel habits  Musculoskeletal: +new myalgia/arthralgia  Skin: No  Rash  Hem/Onc: No  easy bruising/bleeding, No  abnormal lumps/bumps  Neurologic: No  weakness, No  Dizziness  Psychiatric: No  concerns with depression, No  concerns with anxiety  Exam:  BP (!) 154/74 (BP Location: Right Arm, Patient Position: Sitting, Cuff Size: Normal)   Pulse (!) 57   Temp (!) 97.3 F (36.3 C) (Oral)   Wt 187 lb 3.2 oz (84.9 kg)   BMI 25.39 kg/m   Constitutional: VS see above. General Appearance: alert, well-developed, well-nourished, NAD  Eyes: Normal lids and conjunctive, non-icteric sclera  Ears, Nose, Mouth, Throat: MMM, Normal external inspection ears/nares/mouth/lips/gums.  Neck: No masses, trachea midline.   Respiratory: Normal respiratory effort. no wheeze, no rhonchi, no rales  Cardiovascular: S1/S2 normal, no murmur, no rub/gallop auscultated. RRR.   Musculoskeletal: Gait normal. Symmetric and independent movement of all extremities. (+)L finger 5th digit DIP nodes, no effusion, mild erythema  Neurological: Normal balance/coordination. No tremor.  Skin: warm, dry, intact.   Psychiatric: Normal judgment/insight. Normal mood and affect. Oriented x3.   Visit summary with medication list and pertinent instructions was printed for patient to review, alert [place] if any changes needed. All questions at time of visit were answered - patient instructed to contact office with any additional concerns. ER/RTC precautions were  reviewed with the patient and understanding verbalized.   Follow-up plan: Return in about 3 months (around 04/10/2018) for follow-up blood pressure, sooner if needed / sports med if finger no better .    Please note: voice recognition software was used to produce this document, and typos may escape review. Please contact Dr. Sheppard Coil for any needed clarifications.

## 2018-01-10 MED ORDER — INDOMETHACIN 50 MG PO CAPS: ORAL_CAPSULE | ORAL | 1 refills | Status: DC

## 2018-01-10 NOTE — Addendum Note (Signed)
Addended by: Maryla Morrow on: 01/10/2018 07:56 AM   Modules accepted: Orders

## 2018-01-13 ENCOUNTER — Telehealth: Payer: Self-pay | Admitting: Osteopathic Medicine

## 2018-01-13 ENCOUNTER — Ambulatory Visit: Payer: Medicare HMO | Admitting: Family

## 2018-01-13 ENCOUNTER — Encounter (HOSPITAL_COMMUNITY): Payer: Medicare HMO

## 2018-01-13 NOTE — Telephone Encounter (Signed)
Received fax from [employer] that Indomethacin requires a PA. Information has been sent to the insurance company. Awaiting determination.

## 2018-01-13 NOTE — Telephone Encounter (Signed)
Received fax from [place] that [place] was approved from 09/16/2017 through 09/16/2018.  Pharmacy notified and forms sent to scan.   Reference ID: [id number].

## 2018-01-14 ENCOUNTER — Other Ambulatory Visit: Payer: Self-pay | Admitting: Osteopathic Medicine

## 2018-01-14 NOTE — Telephone Encounter (Signed)
[facility] requesting RF on pt's Zolpidem 10mg .  Pharmacy requesting RX for 1 whole tab QD PRN Sleep, but med list shows it was last written for 1/2 tab QD PRN Sleep  Please advise on which of these is correct.   RX pended, with directions for 1 whole QD

## 2018-03-13 ENCOUNTER — Encounter: Payer: Self-pay | Admitting: Osteopathic Medicine

## 2018-03-13 ENCOUNTER — Ambulatory Visit: Payer: Medicare HMO | Admitting: Osteopathic Medicine

## 2018-03-13 VITALS — BP 127/57 | HR 64 | Temp 98.2°F | Wt 189.0 lb

## 2018-03-13 DIAGNOSIS — L578 Other skin changes due to chronic exposure to nonionizing radiation: Secondary | ICD-10-CM

## 2018-03-13 DIAGNOSIS — I10 Essential (primary) hypertension: Secondary | ICD-10-CM | POA: Diagnosis not present

## 2018-03-13 DIAGNOSIS — R21 Rash and other nonspecific skin eruption: Secondary | ICD-10-CM

## 2018-03-13 DIAGNOSIS — B353 Tinea pedis: Secondary | ICD-10-CM

## 2018-03-13 MED ORDER — METHYLPREDNISOLONE SODIUM SUCC 125 MG IJ SOLR
125.0000 mg | Freq: Once | INTRAMUSCULAR | Status: AC
  Administered 2018-03-13: 125 mg via INTRAMUSCULAR

## 2018-03-13 MED ORDER — TERBINAFINE HCL 250 MG PO TABS: 250.0000 mg | ORAL_TABLET | Freq: Every day | ORAL | 0 refills | Status: AC

## 2018-03-13 NOTE — Patient Instructions (Signed)
Will call dermatology and see if we can get you seen sooner Will treat now for itching w/ steroids, I sent medicines for possible fungal infection of the foot  Any worse, let me know!

## 2018-03-13 NOTE — Progress Notes (Signed)
HPI: Brad James is a 84 y.o. male who  has a past medical history of CAD (coronary artery disease) (01/04/2014), Cancer [facility]), Carotid artery stenosis (01/04/2014), Dizzy spells, Essential hypertension, benign (01/04/2014), Glaucoma, Hyperlipidemia (01/04/2014), Hypertension, and Insomnia (01/04/2014).  he presents to [facility] today, 03/13/18,  for chief complaint of:  Skin concerns  Has rash all over the body, super itchy, with concern for a sore on his neck behind right ear.. Also on the feet skin is peeling a bit, he states that often looks like this he has picked it off a bit worse today, it does not really hurt or bother him.  He was unable to get in with his dermatologist for this acute issue.  Itching has been ongoing for about a week.   Past medical history, surgical history, and family history reviewed.  Current medication list and allergy/intolerance information reviewed.   (See remainder of HPI, ROS, Phys Exam below)    ASSESSMENT/PLAN:   Rash and other nonspecific skin eruption - Plan: methylPREDNISolone sodium succinate (SOLU-MEDROL) 125 mg/2 mL injection 125 mg  Tinea pedis of both feet - Plan: terbinafine (LAMISIL) 250 MG tablet  Sun-damaged skin  Essential hypertension, benign   Meds ordered this encounter  Medications  . terbinafine (LAMISIL) 250 MG tablet    Sig: Take 1 tablet (250 mg total) by mouth daily for 14 days.    Dispense:  14 tablet    Refill:  0  . methylPREDNISolone sodium succinate (SOLU-MEDROL) 125 mg/2 mL injection 125 mg    Patient Instructions  Will call dermatology and see if we can get you seen sooner Will treat now for itching w/ steroids, I sent medicines for possible fungal infection of the foot  Any worse, let me know!    Follow-up plan: Return if symptoms worsen or fail to  improve.     ############################################ ############################################ ############################################ ############################################    Outpatient Encounter Medications as of 03/13/2018  Medication Sig Note  . amLODipine (NORVASC) 10 MG tablet Take 1 tablet (10 mg total) by mouth daily.   [name] aspirin EC 81 MG EC tablet Take 1 tablet (81 mg total) by mouth daily.   [name] atorvastatin (LIPITOR) 80 MG tablet Take 1 tablet (80 mg total) by mouth daily.   . brinzolamide (AZOPT) 1 % ophthalmic suspension Place 1 drop into both eyes 2 (two) times daily.   . diclofenac sodium (VOLTAREN) 1 % GEL Apply 2 g topically 4 (four) times daily. To affected joint.   . dorzolamide (TRUSOPT) 2 % ophthalmic solution INSTILL 1 DROP INTO EACH EYE 3 TIMES A DAY   . ferrous sulfate 325 (65 FE) MG EC tablet Take 325 mg by mouth daily with breakfast. 09/19/2017: PRN  . hydrALAZINE (APRESOLINE) 50 MG tablet Take 1 tablet (50 mg total) by mouth 2 (two) times daily. Lunch and dinner   . indomethacin (INDOCIN) 50 MG capsule GOUT FLARE: Take 1 capsule (50 mg) po tid until pain improves, then decrease to 1 capsule daily or bid until pain resolves   . latanoprost (XALATAN) 0.005 % ophthalmic solution    . lisinopril (PRINIVIL,ZESTRIL) 20 MG tablet Take 1 tablet (20 mg total) by mouth 2 (two) times daily.   . meloxicam (MOBIC) 15 MG tablet One tab PO qAM with breakfast for 2 weeks, then daily prn pain. 09/19/2017: PRN  . metoprolol succinate (TOPROL-XL) 25 MG 24 hr tablet TAKE 1 TABLET BY MOUTH TWICE DAILY   . Multiple Vitamin (MULTIVITAMIN)  tablet Take 1 tablet by mouth daily.   [name] omeprazole (PRILOSEC) 20 MG capsule Take 1 capsule (20 mg total) by mouth daily.   [name] OMEPRAZOLE PO Take by mouth.   [name] PHENobarbital (LUMINAL) 32.4 MG tablet TAKE 2 TABLETS BY MOUTH AT BEDTIME   . Probiotic Product (PROBIOTIC PO) Take 1 tablet by mouth daily.   [name] zolpidem (AMBIEN) 10 MG tablet  Take 0.5-1 tablets (5-10 mg total) by mouth at bedtime as needed for sleep.   [name] zolpidem (AMBIEN) 5 MG tablet TAKE 1/2 TABLET BY MOUTH ONCE DAILY AS NEEDED FOR SLEEP    No facility-administered encounter medications on file as of 03/13/2018.    No Known Allergies    Review of Systems:  Constitutional: No recent illness  HEENT: No  headache, no vision change  Cardiac: No  chest pain, No  pressure, No palpitations  Respiratory:  No  shortness of breath.   Musculoskeletal: No new myalgia/arthralgia  Skin: +Rash  Hem/Onc: No  easy bruising/bleeding, No  abnormal lumps/bumps  Neurologic: No  weakness, No  Dizziness   Exam:  BP (!) 127/57   Pulse 64   Temp 98.2 F (36.8 C) (Oral)   Wt 189 lb (85.7 kg)   BMI 25.63 kg/m   Constitutional: VS see above. General Appearance: alert, well-developed, well-nourished, NAD  Eyes: Normal lids and conjunctive, non-icteric sclera  Ears, Nose, Mouth, Throat: MMM, Normal external inspection ears/nares/mouth/lips/gums.  Neck: No masses, trachea midline.   Respiratory: Normal respiratory effort. no wheeze, no rhonchi, no rales  Cardiovascular: S1/S2 normal, no murmur, no rub/gallop auscultated. RRR.   Musculoskeletal: Gait normal. Symmetric and independent movement of all extremities  Neurological: Normal balance/coordination. No tremor.  Skin: warm, dry.  Significant actinic damage and what appear to be benign lesions throughout skin consistent with sun damage, some peeling and flaking particularly on the feet, concern for possible tinea pedis.  See photographs  Psychiatric: Normal judgment/insight. Normal mood and affect. Oriented x3.         Visit summary with medication list and pertinent instructions was printed for patient to review, advised to alert [place] if any changes needed. All questions at time of visit were answered - patient instructed to contact office with any additional concerns. ER/RTC precautions were reviewed with  the patient and understanding verbalized.   Follow-up plan: Return if symptoms worsen or fail to improve.    Please note: voice recognition software was used to produce this document, and typos may escape review. Please contact Dr. Sheppard Coil for any needed clarifications.

## 2018-03-19 ENCOUNTER — Ambulatory Visit: Payer: Medicare HMO | Admitting: Physician Assistant

## 2018-03-19 ENCOUNTER — Encounter: Payer: Self-pay | Admitting: Physician Assistant

## 2018-03-19 VITALS — BP 169/73 | HR 60 | Temp 98.1°F | Wt 189.0 lb

## 2018-03-19 DIAGNOSIS — M109 Gout, unspecified: Secondary | ICD-10-CM | POA: Diagnosis not present

## 2018-03-19 MED ORDER — PREDNISONE 20 MG PO TABS: 40.0000 mg | ORAL_TABLET | Freq: Every day | ORAL | 0 refills | Status: DC

## 2018-03-19 NOTE — Patient Instructions (Signed)

## 2018-03-19 NOTE — Progress Notes (Signed)
HPI:                                                                Brad James is a 84 y.o. male who presents to [place] [employer] today for finger pain/swelling  This is a pleasant 84 yo M with PMH of gout, hyperuricemia, CAD, renal artery stenosis, chronic pancreatitis, and OA who presents with 1 week of swelling, redness and tenderness in his left little finger. Symptoms have been gradually worsening. He has been using Diclofenac gel. He is prescribed Indocin, but he forgot he had this prescription and has not used it. Denies fever, malaise. X-ray of the left hand on 01/09/18 showed severe OA of the 2nd-5th DIP joints as well as soft tissue swelling and lucency of the 5th DIP.   Past Medical History:  Diagnosis Date  . CAD (coronary artery disease) 01/04/2014  . Cancer ([place])    skin  . Carotid artery stenosis 01/04/2014  . Dizzy spells    At times per pt  . Essential hypertension, benign 01/04/2014  . Glaucoma    Bilateral  . Hyperlipidemia 01/04/2014  . Hypertension   . Insomnia 01/04/2014   Past Surgical History:  Procedure Laterality Date  . COLONOSCOPY    . CORONARY ANGIOPLASTY WITH STENT PLACEMENT     stated 3-4 over past 10 yrs.   [name] ENDARTERECTOMY Left 04/18/2016   Procedure: LEFT CAROTID ENDARTERECTOMY;  Surgeon: Serafina Mitchell, MD;  Location: [facility];  Service: Vascular;  Laterality: Left;  [name] MELANOMA EXCISION    . PATCH ANGIOPLASTY Left 04/18/2016   Procedure: PATCH ANGIOPLASTY USING Rueben Bash BIOLOGIC PATCH;  Surgeon: Serafina Mitchell, MD;  Location: [facility];  Service: Vascular;  Laterality: Left;   Social History   Tobacco Use  . Smoking status: Former Smoker    Last attempt to quit: 01/04/1969    Years since quitting: 49.2  . Smokeless tobacco: Never Used  Substance Use Topics  . Alcohol use: No   family history includes Cancer (age of onset: 89) in his father; Heart disease in his mother; Stroke (age of onset: 69) in his  brother.    ROS: negative except as noted in the HPI  Medications: Current Outpatient Medications  Medication Sig Dispense Refill  . amLODipine (NORVASC) 10 MG tablet Take 1 tablet (10 mg total) by mouth daily. 30 tablet 11  . aspirin EC 81 MG EC tablet Take 1 tablet (81 mg total) by mouth daily. 90 tablet 3  . atorvastatin (LIPITOR) 80 MG tablet Take 1 tablet (80 mg total) by mouth daily. 90 tablet 3  . brinzolamide (AZOPT) 1 % ophthalmic suspension Place 1 drop into both eyes 2 (two) times daily. 10 mL 1  . diclofenac sodium (VOLTAREN) 1 % GEL Apply 2 g topically 4 (four) times daily. To affected joint. 100 g 11  . dorzolamide (TRUSOPT) 2 % ophthalmic solution INSTILL 1 DROP INTO EACH EYE 3 TIMES A DAY  6  . ferrous sulfate 325 (65 FE) MG EC tablet Take 325 mg by mouth daily with breakfast.    . hydrALAZINE (APRESOLINE) 50 MG tablet Take 1 tablet (50 mg total) by mouth 2 (two) times daily. Lunch and dinner 180 tablet 1  .  indomethacin (INDOCIN) 50 MG capsule GOUT FLARE: Take 1 capsule (50 mg) po tid until pain improves, then decrease to 1 capsule daily or bid until pain resolves 30 capsule 1  . latanoprost (XALATAN) 0.005 % ophthalmic solution     . lisinopril (PRINIVIL,ZESTRIL) 20 MG tablet Take 1 tablet (20 mg total) by mouth 2 (two) times daily. 180 tablet 1  . meloxicam (MOBIC) 15 MG tablet One tab PO qAM with breakfast for 2 weeks, then daily prn pain. 30 tablet 3  . metoprolol succinate (TOPROL-XL) 25 MG 24 hr tablet TAKE 1 TABLET BY MOUTH TWICE DAILY 60 tablet 2  . Multiple Vitamin (MULTIVITAMIN) tablet Take 1 tablet by mouth daily.    [name] omeprazole (PRILOSEC) 20 MG capsule Take 1 capsule (20 mg total) by mouth daily. 90 capsule 3  . OMEPRAZOLE PO Take by mouth.    [name] PHENobarbital (LUMINAL) 32.4 MG tablet TAKE 2 TABLETS BY MOUTH AT BEDTIME 60 tablet 5  . Probiotic Product (PROBIOTIC PO) Take 1 tablet by mouth daily.    [name] terbinafine (LAMISIL) 250 MG tablet Take 1 tablet (250 mg  total) by mouth daily for 14 days. 14 tablet 0  . zolpidem (AMBIEN) 10 MG tablet Take 0.5-1 tablets (5-10 mg total) by mouth at bedtime as needed for sleep. 30 tablet 2  . zolpidem (AMBIEN) 5 MG tablet TAKE 1/2 TABLET BY MOUTH ONCE DAILY AS NEEDED FOR SLEEP 90 tablet 0   No current facility-administered medications for this visit.    No Known Allergies     Objective:  BP (!) 169/73   Pulse 60   Temp 98.1 F (36.7 C) (Oral)   Wt 189 lb (85.7 kg)   BMI 25.63 kg/m  Gen:  alert, not ill-appearing, no distress, appropriate for age 34: head normocephalic without obvious abnormality, conjunctiva and cornea clear, ectropion of left lower eyelid, trachea midline Pulm: Normal work of breathing, normal phonation Neuro: alert and oriented x 3, no tremor MSK: left fifth DIP tender, swollen and erythematous Skin: intact, no rashes on exposed skin   Study Result   CLINICAL DATA:  Left little finger pain for several months. No injury.  EXAM: LEFT HAND - COMPLETE 3+ VIEW  COMPARISON:  Left hand x-rays dated April 07, 2015.  FINDINGS: No acute fracture or dislocation. Severe osteoarthritis of the second through fifth DIP joints is again noted. New soft tissue swelling and faint hyperdensity surrounding the little finger DIP joint with increasing lucency of the middle phalanx head. Bone mineralization is normal.  IMPRESSION: 1. New soft tissue swelling and faint hyperdensity surrounding the little finger DIP joint with increasing lucency of the middle phalanx head. Findings are nonspecific but could reflect underlying gout. 2. Unchanged severe osteoarthritis of the second through fifth DIP joints.   Electronically Signed   By: Titus Dubin M.D.   On: 01/09/2018 16:11   Lab Results  Component Value Date   LABURIC 8.9 (H) 01/09/2018     No results found for this or any previous visit (from the past 72 hour(s)). No results found.    Assessment and Plan: 84  y.o. male with   Gout of left hand, unspecified cause, unspecified chronicity - Plan: predniSONE (DELTASONE) 20 MG tablet - steroid burst 40 mg x 5 days. If no improvement in 3 days, follow-up with Sports Medicine for intraarticular injection  Patient education and anticipatory guidance given Patient agrees with treatment plan Follow-up as needed if symptoms worsen or fail to improve  Darlyne Russian PA-C

## 2018-03-21 ENCOUNTER — Ambulatory Visit (INDEPENDENT_AMBULATORY_CARE_PROVIDER_SITE_OTHER): Payer: Medicare HMO | Admitting: Sports Medicine

## 2018-03-21 ENCOUNTER — Telehealth: Payer: Self-pay

## 2018-03-21 ENCOUNTER — Encounter: Payer: Self-pay | Admitting: Sports Medicine

## 2018-03-21 DIAGNOSIS — M10042 Idiopathic gout, left hand: Secondary | ICD-10-CM | POA: Diagnosis not present

## 2018-03-21 MED ORDER — ALLOPURINOL 300 MG PO TABS: 300.0000 mg | ORAL_TABLET | Freq: Every day | ORAL | 3 refills | Status: DC

## 2018-03-21 MED ORDER — COLCHICINE 0.6 MG PO TABS
ORAL_TABLET | ORAL | 2 refills | Status: DC
Start: 2018-03-21 — End: 2018-05-15

## 2018-03-21 MED ORDER — INDOMETHACIN 50 MG PO CAPS: 50.0000 mg | ORAL_CAPSULE | Freq: Two times a day (BID) | ORAL | 0 refills | Status: AC

## 2018-03-21 NOTE — Telephone Encounter (Signed)
I called office and had to leave a message for Dr Thompson Caul nurse. Per scheduler, he is booking late October/early November.  Left message requesting pt be seen sooner, left callback information.

## 2018-03-21 NOTE — Progress Notes (Signed)
Subjective:    CC: Hand pain  HPI: This is a pleasant 84 year old male, for the past few days has had worsening swelling in his left fifth DIP, at this point the pain is severe.  He has had a uric acid level checked in the past recently that was elevated.  Prednisone has not been effective to this point.  Pain is localized without radiation.  No fevers or chills, no trauma.  I reviewed the past medical history, family history, social history, surgical history, and allergies today and no changes were needed.  Please see the problem list section below in epic for further details.  Past Medical History: Past Medical History:  Diagnosis Date  . CAD (coronary artery disease) 01/04/2014  . Cancer ([place])    skin  . Carotid artery stenosis 01/04/2014  . Dizzy spells    At times per pt  . Essential hypertension, benign 01/04/2014  . Glaucoma    Bilateral  . Hyperlipidemia 01/04/2014  . Hypertension   . Insomnia 01/04/2014   Past Surgical History: Past Surgical History:  Procedure Laterality Date  . COLONOSCOPY    . CORONARY ANGIOPLASTY WITH STENT PLACEMENT     stated 3-4 over past 10 yrs.   [name] ENDARTERECTOMY Left 04/18/2016   Procedure: LEFT CAROTID ENDARTERECTOMY;  Surgeon: Serafina Mitchell, MD;  Location: [place];  Service: Vascular;  Laterality: Left;  [name] MELANOMA EXCISION    . PATCH ANGIOPLASTY Left 04/18/2016   Procedure: PATCH ANGIOPLASTY USING Rueben Bash BIOLOGIC PATCH;  Surgeon: Serafina Mitchell, MD;  Location[facility];  Service: Vascular;  Laterality: Left;   Social History: Social History   Socioeconomic History  . Marital status: Married    Spouse name: Not on file  . Number of children: Not on file  . Years of education: Not on file  . Highest education level: Not on file  Occupational History  . Not on file  Social Needs  . Financial resource strain: Not on file  . Food insecurity:    Worry: Not on file    Inability: Not on file  . Transportation needs:    Medical: Not on file     Non-medical: Not on file  Tobacco Use  . Smoking status: Former Smoker    Last attempt to quit: 01/04/1969    Years since quitting: 49.2  . Smokeless tobacco: Never Used  Substance and Sexual Activity  . Alcohol use: No  . Drug use: No  . Sexual activity: Not Currently    Partners: Female  Lifestyle  . Physical activity:    Days per week: Not on file    Minutes per session: Not on file  . Stress: Not on file  Relationships  . Social connections:    Talks on phone: Not on file    Gets together: Not on file    Attends religious service: Not on file    Active member of club or organization: Not on file    Attends meetings of clubs or organizations: Not on file    Relationship status: Not on file  Other Topics Concern  . Not on file  Social History Narrative  . Not on file   Family History: Family History  Problem Relation Age of Onset  . Heart disease Mother   . Cancer Father 62       oral  . Stroke Brother 45   Allergies: No Known Allergies Medications: See med rec.  Review of Systems: No fevers, chills, night sweats, weight  loss, chest pain, or shortness of breath.   Objective:    General: Well Developed, well nourished, and in no acute distress.  Neuro: Alert and oriented x3, extra-ocular muscles intact, sensation grossly intact.  HEENT: Normocephalic, atraumatic, pupils equal round reactive to light, neck supple, no masses, no lymphadenopathy, thyroid nonpalpable.  Skin: Warm and dry, no rashes. Cardiac: Regular rate and rhythm, no murmurs rubs or gallops, no lower extremity edema.  Respiratory: Clear to auscultation bilaterally. Not using accessory muscles, speaking in full sentences. Left hand: Left fifth DIP is erythematous, swollen.  Palpable synovitis.  Procedure: Real-time Ultrasound Guided Injection of left fifth DIP Device: GE Logiq E  Verbal informed consent obtained.  Time-out conducted.  Noted no overlying erythema, induration, or other signs of  local infection.  Skin prepped in a sterile fashion.  Local anesthesia: Topical Ethyl chloride.  With sterile technique and under real time ultrasound guidance: I did not see any effusion that would be amenable to drainage, simply severe synovitis, 25-gauge needle advanced into the joint, injected 1/2 cc kenalog 40, 1/2 cc lidocaine Completed without difficulty  Advised to call if fevers/chills, erythema, induration, drainage, or persistent bleeding.  Images permanently stored and available for review in the ultrasound unit.  Impression: Technically successful ultrasound guided injection.  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 Uric acid levels have been as high as 8.9 in the recent past. Injection, no arthrocentesis possible, mostly synovitis without effusion. Adding colchicine, indomethacin, as well as allopurinol, recheck uric acid levels in 1 month, goal is less than 5. ___________________________________________ Gwen Her. Dianah Field, M.D., ABFM., CAQSM. Primary Care and [place] Instructor of [place] [facility]

## 2018-03-21 NOTE — Assessment & Plan Note (Signed)
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 Uric acid levels have been as high as 8.9 in the recent past. Injection, no arthrocentesis possible, mostly synovitis without effusion. Adding colchicine, indomethacin, as well as allopurinol, recheck uric acid levels in 1 month, goal is less than 5.

## 2018-03-21 NOTE — Telephone Encounter (Signed)
-----   Message from Emeterio [name], DO sent at 03/13/2018  5:46 PM EDT ----- Regarding: move up dermatology appointment if possible Patient seen yesterday for rash and widespread itching.  States he was unable to get into his regular dermatologist until October.  Can we call them and see if they may be able to see him in the next couple of weeks just to recheck everything?  Sees Dr. Orbie Hurst at [facility] dermatology [phone]

## 2018-03-24 NOTE — Telephone Encounter (Signed)
Received call from brittany at [facility] and pt has been scheduled for 03-25-18 at 9"30AM with Dr Susie Cassette.

## 2018-03-25 DIAGNOSIS — Z85828 Personal history of other malignant neoplasm of skin: Secondary | ICD-10-CM | POA: Diagnosis not present

## 2018-03-25 DIAGNOSIS — Z08 Encounter for follow-up examination after completed treatment for malignant neoplasm: Secondary | ICD-10-CM | POA: Diagnosis not present

## 2018-03-25 DIAGNOSIS — C4441 Basal cell carcinoma of skin of scalp and neck: Secondary | ICD-10-CM | POA: Diagnosis not present

## 2018-03-25 DIAGNOSIS — Z85820 Personal history of malignant melanoma of skin: Secondary | ICD-10-CM | POA: Diagnosis not present

## 2018-03-25 DIAGNOSIS — D485 Neoplasm of uncertain behavior of skin: Secondary | ICD-10-CM | POA: Diagnosis not present

## 2018-03-25 DIAGNOSIS — D0422 Carcinoma in situ of skin of left ear and external auricular canal: Secondary | ICD-10-CM | POA: Diagnosis not present

## 2018-03-25 DIAGNOSIS — B353 Tinea pedis: Secondary | ICD-10-CM | POA: Diagnosis not present

## 2018-03-26 NOTE — Telephone Encounter (Signed)
Noted - thanks for working on this!

## 2018-04-10 ENCOUNTER — Other Ambulatory Visit: Payer: Self-pay | Admitting: Osteopathic Medicine

## 2018-04-21 ENCOUNTER — Ambulatory Visit: Payer: Medicare HMO | Admitting: Sports Medicine

## 2018-04-23 IMAGING — NM NM MISC PROCEDURE
6 series · 36 of 36 positions shown · non-contrast
Comparison: none

[Series 1: rest · 6.40mm/px · 6 of 64 frames shown]
[frame 6/64]
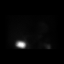
[frame 16/64]
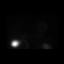
[frame 27/64]
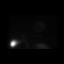
[frame 38/64]
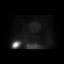
[frame 48/64]
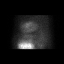
[frame 59/64]
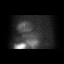

[Series 1: stress-gsp · 6.40mm/px · 6 of 512 frames shown]
[frame 43/512]
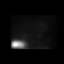
[frame 128/512]
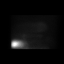
[frame 214/512]
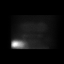
[frame 299/512]
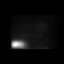
[frame 384/512]
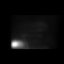
[frame 470/512]
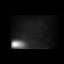

[Series 1: wbr_s-proj_st stress-sum-em · 6.40mm/px · 6 of 64 frames shown]
[frame 6/64]
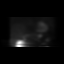
[frame 16/64]
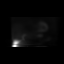
[frame 27/64]
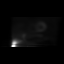
[frame 38/64]
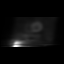
[frame 48/64]
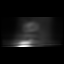
[frame 59/64]
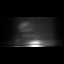

[Series 1: wbr_s-proj_st stress-gsp · 6.40mm/px · 6 of 512 frames shown]
[frame 43/512]
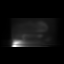
[frame 128/512]
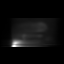
[frame 214/512]
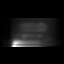
[frame 299/512]
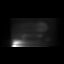
[frame 384/512]
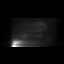
[frame 470/512]
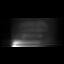

[Series 1: stress-sum-em · 6.40mm/px · 6 of 64 frames shown]
[frame 6/64]
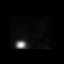
[frame 16/64]
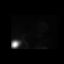
[frame 27/64]
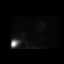
[frame 38/64]
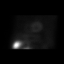
[frame 48/64]
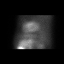
[frame 59/64]
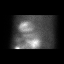

[Series 1: wbr_r-proj_st rest · 6.40mm/px · 6 of 64 frames shown]
[frame 6/64]
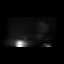
[frame 16/64]
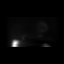
[frame 27/64]
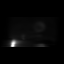
[frame 38/64]
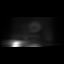
[frame 48/64]
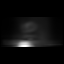
[frame 59/64]
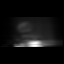

[36 of 36 positions shown; findings below may reference images not displayed]

Canned report from images found in remote index.

Refer to host system for actual result text.

## 2018-05-14 ENCOUNTER — Other Ambulatory Visit: Payer: Self-pay | Admitting: Family Medicine

## 2018-05-15 ENCOUNTER — Ambulatory Visit (INDEPENDENT_AMBULATORY_CARE_PROVIDER_SITE_OTHER): Payer: Medicare HMO | Admitting: Family Medicine

## 2018-05-15 ENCOUNTER — Encounter: Payer: Self-pay | Admitting: Family Medicine

## 2018-05-15 VITALS — BP 169/69 | HR 57 | Ht 72.0 in | Wt 193.0 lb

## 2018-05-15 DIAGNOSIS — M10042 Idiopathic gout, left hand: Secondary | ICD-10-CM | POA: Diagnosis not present

## 2018-05-15 DIAGNOSIS — Z23 Encounter for immunization: Secondary | ICD-10-CM

## 2018-05-15 LAB — COMPLETE METABOLIC PANEL WITH GFR
AG Ratio: 1.7 (calc) (ref 1.0–2.5)
ALBUMIN MSPROF: 4.2 g/dL (ref 3.6–5.1)
ALT: 17 U/L (ref 9–46)
AST: 19 U/L (ref 10–35)
Alkaline phosphatase (APISO): 94 U/L (ref 40–115)
BILIRUBIN TOTAL: 0.3 mg/dL (ref 0.2–1.2)
BUN / CREAT RATIO: 16 (calc) (ref 6–22)
BUN: 30 mg/dL — ABNORMAL HIGH (ref 7–25)
CHLORIDE: 108 mmol/L (ref 98–110)
CO2: 27 mmol/L (ref 20–32)
Calcium: 9.4 mg/dL (ref 8.6–10.3)
Creat: 1.9 mg/dL — ABNORMAL HIGH (ref 0.70–1.11)
GFR, EST AFRICAN AMERICAN: 36 mL/min/{1.73_m2} — AB (ref >=60)
GFR, Est Non African American: 31 mL/min/{1.73_m2} — ABNORMAL LOW (ref >=60)
GLOBULIN: 2.5 g/dL (ref 1.9–3.7)
Glucose, Bld: 110 mg/dL — ABNORMAL HIGH (ref 65–99)
POTASSIUM: 5 mmol/L (ref 3.5–5.3)
SODIUM: 144 mmol/L (ref 135–146)
TOTAL PROTEIN: 6.7 g/dL (ref 6.1–8.1)

## 2018-05-15 LAB — CBC
HEMATOCRIT: 37.3 % — AB (ref 38.5–50.0)
Hemoglobin: 12.7 g/dL — ABNORMAL LOW (ref 13.2–17.1)
MCH: 32.6 pg (ref 27.0–33.0)
MCHC: 34 g/dL (ref 32.0–36.0)
MCV: 95.6 fL (ref 80.0–100.0)
MPV: 9.4 fL (ref 7.5–12.5)
PLATELETS: 273 10*3/uL (ref 140–400)
RBC: 3.9 10*6/uL — AB (ref 4.20–5.80)
RDW: 12.6 % (ref 11.0–15.0)
WBC: 11.5 10*3/uL — ABNORMAL HIGH (ref 3.8–10.8)

## 2018-05-15 LAB — URIC ACID: Uric Acid, Serum: 9.2 mg/dL — ABNORMAL HIGH (ref 4.0–8.0)

## 2018-05-15 MED ORDER — COLCHICINE 0.6 MG PO TABS: ORAL_TABLET | ORAL | 2 refills | Status: DC

## 2018-05-15 MED ORDER — ALLOPURINOL 300 MG PO TABS: 300.0000 mg | ORAL_TABLET | Freq: Every day | ORAL | 1 refills | Status: DC

## 2018-05-15 NOTE — Patient Instructions (Signed)
Thank you for coming in today. Get xray and labs.  I will contact you with results.  Make sure you are taking allopurinol.  Use colchicine for gout flair pain   Recheck when you return from [place].    Gout Gout is painful swelling that can happen in some of your joints. Gout is a type of arthritis. This condition is caused by having too much uric acid in your body. Uric acid is a chemical that is made when your body breaks down substances called purines. If your body has too much uric acid, sharp crystals can form and build up in your joints. This causes pain and swelling. Gout attacks can happen quickly and be very painful (acute gout). Over time, the attacks can affect more joints and happen more often (chronic gout). Follow these instructions at home: During a Gout Attack  If directed, put ice on the painful area: ? Put ice in a plastic bag. ? Place a towel between your skin and the bag. ? Leave the ice on for 20 minutes, 2-3 times a day.  Rest the joint as much as possible. If the joint is in your leg, you may be given crutches to use.  Raise (elevate) the painful joint above the level of your heart as often as you can.  Drink enough fluids to keep your pee (urine) clear or pale yellow.  Take over-the-counter and prescription medicines only as told by your doctor.  Do not drive or use heavy machinery while taking prescription pain medicine.  Follow instructions from your doctor about what you can or cannot eat and drink.  Return to your normal activities as told by your doctor. Ask your doctor what activities are safe for you. Avoiding Future Gout Attacks  Follow a low-purine diet as told by a specialist (dietitian) or your doctor. Avoid foods and drinks that have a lot of purines, such as: ? Liver. ? Kidney. ? Anchovies. ? Asparagus. ? Herring. ? Mushrooms ? Mussels. ? Beer.  Limit alcohol intake to no more than 1 drink a day for nonpregnant women and 2 drinks a  day for men. One drink equals 12 oz of beer, 5 oz of wine, or 1 oz of hard liquor.  Stay at a healthy weight or lose weight if you are overweight. If you want to lose weight, talk with your doctor. It is important that you do not lose weight too fast.  Start or continue an exercise plan as told by your doctor.  Drink enough fluids to keep your pee clear or pale yellow.  Take over-the-counter and prescription medicines only as told by your doctor.  Keep all follow-up visits as told by your doctor. This is important. Contact a doctor if:  You have another gout attack.  You still have symptoms of a gout attack after10 days of treatment.  You have problems (side effects) because of your medicines.  You have chills or a fever.  You have burning pain when you pee (urinate).  You have pain in your lower back or belly. Get help right away if:  You have very bad pain.  Your pain cannot be controlled.  You cannot pee. This information is not intended to replace advice given to you by your health care provider. Make sure you discuss any questions you have with your health care provider. Document Released: 06/12/2008 Document Revised: 02/09/2016 Document Reviewed: 06/16/2015 Elsevier Interactive Patient Education  [employer].

## 2018-05-15 NOTE — Progress Notes (Signed)
Brad James is a 84 y.o. male who presents to [facility] today for left hand swelling.  Brad James has a history of recurrent pain and swelling of his left fifth digit thought to be due to gout tophi.  He was seen most recently on July 5 for significant swelling at the fifth DIP.  At that time he had an intra-articular injection and was prescribed colchicine for use as needed and allopurinol.  He notes that he is developed new swelling in his left fifth digit now at the PIP.  He continues to have swelling at the DIP but the new swelling was located at PIP.  The swelling is not particularly painful but it is large and obnoxious.  He notes the swelling interferes with his ability to grab an object and play golf.  He notes that he is leaving in about 3 days for a 60-month trip to [place].  He is not sure if he is taking the allopurinol and on reconsideration is pretty sure that he is not actually taking the allopurinol.  He is not sure if he ever started it or just ran out.    ROS:  As above  Exam:  BP (!) 169/69   Pulse (!) 57   Ht 6' (1.829 m)   Wt 193 lb (87.5 kg)   BMI 26.18 kg/m  General: Well Developed, well nourished, and in no acute distress.  Neuro/Psych: Alert and oriented x3, extra-ocular muscles intact, able to move all 4 extremities, sensation grossly intact. Skin: Warm and dry, no rashes noted.  Respiratory: Not using accessory muscles, speaking in full sentences, trachea midline.  Cardiovascular: Pulses palpable, no extremity edema. Abdomen: Does not appear distended. MSK:  Left hand: Significant erythema and swelling at fifth digit DIP and PIP.  Not particularly tender.  Reduced motion.  Capillary refill and sensation are intact.  Pulses are intact at the wrist.  Hand motion is otherwise normal.    Lab and Radiology Results Limited musculoskeletal ultrasound of left hand mass at PIP: Cystic structure filled with moving  hypoechoic structures consistent appearance with gout present at PIP.  Similar in appearance to DIP structure.  Degenerative changes present in PIP.  No fractures visible.  Bones are otherwise normal-appearing  Hand x-ray pending   Lab Results  Component Value Date   LABURIC 8.9 (H) 01/09/2018       Assessment and Plan: 84 y.o. 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  I believe the pain is reasonably controlled because patient is taking colchicine but his tophi have not improved because I do not think he is taking allopurinol.  Plan to recheck uric acid levels and metabolic panel today.  Go ahead and re-prescribe allopurinol and colchicine.  Recommend patient continue conservative management and return for recheck when he returns back to the area after his trip to [place].  Influenza vaccine given today prior to discharge.   Orders Placed This Encounter  Procedures  . DG Hand Complete Left    Standing Status:   Future    Standing Expiration Date:   07/16/2019    Order Specific Question:   Reason for Exam (SYMPTOM  OR DIAGNOSIS REQUIRED)    Answer:   eval pain and swelling left 5th digit suscpt tophi    Order Specific Question:   Preferred imaging location?    Answer:   [facility]    Order Specific Question:   Radiology Contrast  Protocol - do NOT remove file path    Answer:   [email or web address]  . CBC  . COMPLETE METABOLIC PANEL WITH GFR  . Uric acid   Meds ordered this encounter  Medications  . allopurinol (ZYLOPRIM) 300 MG tablet    Sig: Take 1 tablet (300 mg total) by mouth daily.    Dispense:  90 tablet    Refill:  1  . colchicine 0.6 MG tablet    Sig: 1 tab p.o. twice daily for 7 days for flares    Dispense:  30 tablet    Refill:  2    Please try to run Colcrys and Mitigare (may substitute for capsules) to see which is cheaper.    Historical information moved to improve visibility of  documentation.  Past Medical History:  Diagnosis Date  . CAD (coronary artery disease) 01/04/2014  . Cancer ([place])    skin  . Carotid artery stenosis 01/04/2014  . Dizzy spells    At times per pt  . Essential hypertension, benign 01/04/2014  . Glaucoma    Bilateral  . Hyperlipidemia 01/04/2014  . Hypertension   . Insomnia 01/04/2014   Past Surgical History:  Procedure Laterality Date  . COLONOSCOPY    . CORONARY ANGIOPLASTY WITH STENT PLACEMENT     stated 3-4 over past 10 yrs.   [name] ENDARTERECTOMY Left 04/18/2016   Procedure: LEFT CAROTID ENDARTERECTOMY;  Surgeon: Serafina Mitchell, MD;  Location: [place];  Service: Vascular;  Laterality: Left;  [name] MELANOMA EXCISION    . PATCH ANGIOPLASTY Left 04/18/2016   Procedure: PATCH ANGIOPLASTY USING Rueben Bash BIOLOGIC PATCH;  Surgeon: Serafina Mitchell, MD;  Location: [facility] OR;  Service: Vascular;  Laterality: Left;   Social History   Tobacco Use  . Smoking status: Former Smoker    Last attempt to quit: 01/04/1969    Years since quitting: 49.3  . Smokeless tobacco: Never Used  Substance Use Topics  . Alcohol use: No   family history includes Cancer (age of onset: 42) in his father; Heart disease in his mother; Stroke (age of onset: 38) in his brother.  Medications: Current Outpatient Medications  Medication Sig Dispense Refill  . allopurinol (ZYLOPRIM) 300 MG tablet Take 1 tablet (300 mg total) by mouth daily. 90 tablet 1  . aspirin EC 81 MG EC tablet Take 1 tablet (81 mg total) by mouth daily. 90 tablet 3  . atorvastatin (LIPITOR) 80 MG tablet Take 1 tablet (80 mg total) by mouth daily. 90 tablet 3  . brinzolamide (AZOPT) 1 % ophthalmic suspension Place 1 drop into both eyes 2 (two) times daily. 10 mL 1  . colchicine 0.6 MG tablet 1 tab p.o. twice daily for 7 days for flares 30 tablet 2  . diclofenac sodium (VOLTAREN) 1 % GEL Apply 2 g topically 4 (four) times daily. To affected joint. 100 g 11  . dorzolamide (TRUSOPT) 2 % ophthalmic solution INSTILL  1 DROP INTO EACH EYE 3 TIMES A DAY  6  . hydrALAZINE (APRESOLINE) 50 MG tablet Take 1 tablet (50 mg total) by mouth 2 (two) times daily. Lunch and dinner 180 tablet 1  . indomethacin (INDOCIN) 50 MG capsule Take 1 capsule (50 mg total) by mouth 2 (two) times daily with a meal. 60 capsule 0  . latanoprost (XALATAN) 0.005 % ophthalmic solution     . lisinopril (PRINIVIL,ZESTRIL) 20 MG tablet Take 1 tablet (20 mg total) by mouth 2 (two) times daily. 180 tablet 1  . metoprolol  succinate (TOPROL-XL) 25 MG 24 hr tablet TAKE 1 TABLET BY MOUTH TWICE DAILY 60 tablet 2  . Multiple Vitamin (MULTIVITAMIN) tablet Take 1 tablet by mouth daily.    [name] PHENobarbital (LUMINAL) 32.4 MG tablet TAKE 2 TABLETS BY MOUTH AT BEDTIME 60 tablet 5  . Probiotic Product (PROBIOTIC PO) Take 1 tablet by mouth daily.    [name] zolpidem (AMBIEN) 10 MG tablet TAKE 1/2 TO 1 (ONE-HALF TO ONE) TABLET BY MOUTH AT BEDTIME AS NEEDED FOR SLEEP 30 tablet 0  . amLODipine (NORVASC) 10 MG tablet Take 1 tablet (10 mg total) by mouth daily. 30 tablet 11   No current facility-administered medications for this visit.    No Known Allergies    Discussed warning signs or symptoms. Please see discharge instructions. Patient expresses understanding.

## 2018-05-16 MED ORDER — ALLOPURINOL 100 MG PO TABS: 100.0000 mg | ORAL_TABLET | Freq: Every day | ORAL | 0 refills | Status: DC

## 2018-05-16 NOTE — Addendum Note (Signed)
Addended by: Gregor Hams on: 05/16/2018 06:57 AM   Modules accepted: Orders

## 2018-06-06 ENCOUNTER — Other Ambulatory Visit: Payer: Self-pay | Admitting: Osteopathic Medicine

## 2018-06-25 ENCOUNTER — Other Ambulatory Visit: Payer: Self-pay | Admitting: Osteopathic Medicine

## 2018-07-01 ENCOUNTER — Telehealth: Payer: Self-pay

## 2018-07-01 ENCOUNTER — Other Ambulatory Visit: Payer: Self-pay

## 2018-07-01 MED ORDER — PHENOBARBITAL 32.4 MG PO TABS: ORAL_TABLET | ORAL | 3 refills | Status: DC

## 2018-07-01 NOTE — Telephone Encounter (Signed)
Task completed. Rx previously sent to [facility] in [place] - cancelled by pharmacist Clarise Cruz. Pt requests for rx to be sent to [place] ([place]). Rx printed and placed in provider's box, pending signature.

## 2018-07-01 NOTE — Telephone Encounter (Signed)
Pt walked in office today with concerns about his Phenobarbital. Last RX was sent to [facility] in [place] and filled once on 06-02-18. Pt came back home early from [place] and needs RX to go to local [facility].   Pt wants to know if [facility] in [place] can be called to cancel current RX and new RX can be sent locally.  Please advise

## 2018-07-02 ENCOUNTER — Encounter: Payer: Self-pay | Admitting: Family Medicine

## 2018-07-02 ENCOUNTER — Ambulatory Visit: Payer: Medicare HMO | Admitting: Family Medicine

## 2018-07-02 VITALS — BP 162/81 | HR 63 | Wt 183.0 lb

## 2018-07-02 DIAGNOSIS — D649 Anemia, unspecified: Secondary | ICD-10-CM

## 2018-07-02 DIAGNOSIS — N183 Chronic kidney disease, stage 3 unspecified: Secondary | ICD-10-CM | POA: Insufficient documentation

## 2018-07-02 DIAGNOSIS — Z139 Encounter for screening, unspecified: Secondary | ICD-10-CM | POA: Diagnosis not present

## 2018-07-02 DIAGNOSIS — M10042 Idiopathic gout, left hand: Secondary | ICD-10-CM | POA: Diagnosis not present

## 2018-07-02 DIAGNOSIS — I150 Renovascular hypertension: Secondary | ICD-10-CM | POA: Diagnosis not present

## 2018-07-02 DIAGNOSIS — I701 Atherosclerosis of renal artery: Secondary | ICD-10-CM

## 2018-07-02 DIAGNOSIS — T148XXA Other injury of unspecified body region, initial encounter: Secondary | ICD-10-CM

## 2018-07-02 DIAGNOSIS — M1A9XX1 Chronic gout, unspecified, with tophus (tophi): Secondary | ICD-10-CM | POA: Diagnosis not present

## 2018-07-02 DIAGNOSIS — Z23 Encounter for immunization: Secondary | ICD-10-CM

## 2018-07-02 MED ORDER — PHENOBARBITAL 32.4 MG PO TABS: ORAL_TABLET | ORAL | 3 refills | Status: DC

## 2018-07-02 MED ORDER — COLCHICINE 0.6 MG PO TABS: 0.3000 mg | ORAL_TABLET | Freq: Every day | ORAL | 2 refills | Status: DC

## 2018-07-02 MED ORDER — ALLOPURINOL 100 MG PO TABS: 100.0000 mg | ORAL_TABLET | Freq: Every day | ORAL | 0 refills | Status: DC

## 2018-07-02 NOTE — Patient Instructions (Addendum)
Thank you for coming in today. Get labs today.  Take allopurinol 100mg  daily.  Take colchicine 1/2 of the 0.6mg  pill daily for 2 months. Total dose should be 0.3mg  daily.   Recheck with Dr Sheppard Coil in 2 months and we will recheck labs then.   Allopurinol injection What is this medicine? ALLOPURINOL (al oh PURE i nole) reduces the amount of uric acid the body makes during chemotherapy. Too much uric acid in the blood can cause damage to your kidneys. This medicine may be used for other purposes; ask your health care provider or pharmacist if you have questions. COMMON BRAND NAME(S): Aloprim What should I tell my health care provider before I take this medicine? They need to know if you have any of these conditions: -kidney disease -liver disease -an unusual or allergic reaction to allopurinol, other medicines, foods, dyes, or preservatives -pregnant or trying to get pregnant -breast feeding How should I use this medicine? The medicine is for infusion into a vein. It is given by a health care professional in a hospital or clinic setting. Talk to your pediatrician regarding the use of this medicine in children. Special care may be needed. Overdosage: If you think you have taken too much of this medicine contact a poison control center or emergency room at once. NOTE: This medicine is only for you. Do not share this medicine with others. What if I miss a dose? This does not apply. What may interact with this medicine? Do not take this medicine with the following medication: -didanosine, ddI This medicine may also interact with the following medications: -amoxicillin or ampicillin -azathioprine -certain medicines used to treat gout -chlorpropamide -cyclosporine -diuretics -mercaptopurine -probenecid -sulfinpyrazone -warfarin This list may not describe all possible interactions. Give your health care provider a list of all the medicines, herbs, non-prescription drugs, or dietary  supplements you use. Also tell them if you smoke, drink alcohol, or use illegal drugs. Some items may interact with your medicine. What should I watch for while using this medicine? Your condition will be monitored carefully while you are receiving this medicine. You may get drowsy or dizzy. Do not drive, use machinery, or do anything that needs mental alertness until you know how this medicine affects you. Do not stand or sit up quickly, especially if you are an older patient. This reduces the risk of dizzy or fainting spells. Drink plenty of water while you are taking this medicine. This will help to reduce the risk of getting gout or kidney stones. What side effects may I notice from receiving this medicine? Side effects that you should report to your doctor or health care professional as soon as possible: -allergic reactions like skin rash, itching or hives, swelling of the face, lips, or tongue -difficulty passing urine -loss of appetite -redness, blistering, peeling or loosening of the skin, including inside the mouth -unusual bleeding or bruising -unusually weak or tired -vomiting -yellowing of the skin or whites of the eyes Side effects that usually do not require medical attention (report to your doctor or health care professional if they continue or are bothersome): -diarrhea -drowsiness -nausea -stomach pain This list may not describe all possible side effects. Call your doctor for medical advice about side effects. You may report side effects to FDA at 1-800-FDA-1088. Where should I keep my medicine? This drug is given in a hospital or clinic and will not be stored at home. NOTE: This sheet is a summary. It may not cover all possible information. If  you have questions about this medicine, talk to your doctor, pharmacist, or health care provider.  2018 Elsevier/Gold Standard (2008-03-09 10:32:21)

## 2018-07-02 NOTE — Progress Notes (Signed)
Brad James is a 84 y.o. male who presents to [facility] today for follow-up of gout and tophi.  Patient was seen at the end of August for left finger swelling thought to be due to gouty tophi.  He was prescribed colchicine and a lab work-up was obtained.  He notes that he took colchicine for a few days and felt better.  He was prescribed allopurinol but notes that he has not been taking it.  Lab work-up showed elevated uric acid level of around 9.  Lab work unfortunately also showed mildly elevated creatinine of 1.9 up from baseline of about 1.4.  He recently got back in town and is feeling pretty good.  He denies significant leg swelling or gout flares.  He does continue to have finger swelling but is not painful   ROS as above:  Exam:  BP (!) 162/81   Pulse 63   Wt 183 lb (83 kg)   BMI 24.82 kg/m  Wt Readings from Last 5 Encounters:  07/02/18 183 lb (83 kg)  05/15/18 193 lb (87.5 kg)  03/21/18 190 lb (86.2 kg)  03/19/18 189 lb (85.7 kg)  03/13/18 189 lb (85.7 kg)    Gen: Well NAD HEENT: EOMI,  MMM Lungs: Normal work of breathing. CTABL Heart: RRR no MRG Abd: NABS, Soft. Nondistended, Nontender Exts: Brisk capillary refill, warm and well perfused.  Left hand significantly swollen fifth digit nontender.  Lab and Radiology Results Recent Results (from the past 2160 hour(s))  CBC     Status: Abnormal   Collection Time: 05/15/18  1:38 PM  Result Value Ref Range   WBC 11.5 (H) 3.8 - 10.8 Thousand/uL   RBC 3.90 (L) 4.20 - 5.80 Million/uL   Hemoglobin 12.7 (L) 13.2 - 17.1 g/dL   HCT 37.3 (L) 38.5 - 50.0 %   MCV 95.6 80.0 - 100.0 fL   MCH 32.6 27.0 - 33.0 pg   MCHC 34.0 32.0 - 36.0 g/dL   RDW 12.6 11.0 - 15.0 %   Platelets 273 140 - 400 Thousand/uL   MPV 9.4 7.5 - 12.5 fL  COMPLETE METABOLIC PANEL WITH GFR     Status: Abnormal   Collection Time:  05/15/18  1:38 PM  Result Value Ref Range   Glucose, Bld 110 (H) 65 - 99 mg/dL    Comment: .            Fasting reference interval . For someone without known diabetes, a glucose value between 100 and 125 mg/dL is consistent with prediabetes and should be confirmed with a follow-up test. .    BUN 30 (H) 7 - 25 mg/dL   Creat 1.90 (H) 0.70 - 1.11 mg/dL    Comment: For patients >30 years of age, the reference limit for Creatinine is approximately 13% higher for people identified as African-American. .    GFR, Est Non African American 31 (L) > OR = 60 mL/min/1.8m2   GFR, Est African American 36 (L) > OR = 60 mL/min/1.67m2   BUN/Creatinine Ratio 16 6 - 22 (calc)   Sodium 144 135 - 146 mmol/L   Potassium 5.0 3.5 - 5.3 mmol/L   Chloride 108 98 - 110 mmol/L   CO2 27 20 - 32 mmol/L   Calcium 9.4 8.6 - 10.3 mg/dL   Total Protein 6.7 6.1 - 8.1 g/dL   Albumin 4.2 3.6 - 5.1 g/dL   Globulin 2.5 1.9 - 3.7 g/dL (  calc)   AG Ratio 1.7 1.0 - 2.5 (calc)   Total Bilirubin 0.3 0.2 - 1.2 mg/dL   Alkaline phosphatase (APISO) 94 40 - 115 U/L   AST 19 10 - 35 U/L   ALT 17 9 - 46 U/L  Uric acid     Status: Abnormal   Collection Time: 05/15/18  1:38 PM  Result Value Ref Range   Uric Acid, Serum 9.2 (H) 4.0 - 8.0 mg/dL    Comment: Therapeutic target for gout patients: <6.0 mg/dL .        Assessment and Plan: 84 y.o. male with  Chronic gout with tophi.  Uric acid 9.2.  Patient has not started allopurinol.  Plan to start allopurinol at 100 mg along with reduced dose colchicine prophylaxis.  Adjust the dose based on renal function.  Recheck labs in about 2 months.  Follow-up with PCP.  Plan to recheck uric acid level at that time and adjust allopurinol as needed.  Plan to use colchicine prophylactically for about 2 months.  Worsening kidney function.  Plan to recheck renal panel and adjust as needed.  Additionally patient had mildly decreased hemoglobin at last labs will recheck CBC.  Flu and  Tdap vaccines given today prior to discharge.   Orders Placed This Encounter  Procedures  . Flu Vaccine QUAD 6+ mos PF IM (Fluarix Quad PF)  . Tdap vaccine greater than or equal to 7yo IM  . Renal Function Panel  . CBC   Meds ordered this encounter  Medications  . allopurinol (ZYLOPRIM) 100 MG tablet    Sig: Take 1 tablet (100 mg total) by mouth daily.    Dispense:  90 tablet    Refill:  0    Replaces 300mg  dose GFR 35 recent labs  . colchicine 0.6 MG tablet    Sig: Take 0.5 tablets (0.3 mg total) by mouth daily. Daily for gout prevention for 2 months while starting allopurinol    Dispense:  30 tablet    Refill:  2    Please try to run Colcrys and Mitigare (may substitute for capsules) to see which is cheaper.     Historical information moved to improve visibility of documentation.  Past Medical History:  Diagnosis Date  . CAD (coronary artery disease) 01/04/2014  . Cancer ([place])    skin  . Carotid artery stenosis 01/04/2014  . Dizzy spells    At times per pt  . Essential hypertension, benign 01/04/2014  . Glaucoma    Bilateral  . Hyperlipidemia 01/04/2014  . Hypertension   . Insomnia 01/04/2014   Past Surgical History:  Procedure Laterality Date  . COLONOSCOPY    . CORONARY ANGIOPLASTY WITH STENT PLACEMENT     stated 3-4 over past 10 yrs.   [name] ENDARTERECTOMY Left 04/18/2016   Procedure: LEFT CAROTID ENDARTERECTOMY;  Surgeon: Serafina Mitchell, MD;  Location: [facility];  Service: Vascular;  Laterality: Left;  [name] MELANOMA EXCISION    . PATCH ANGIOPLASTY Left 04/18/2016   Procedure: PATCH ANGIOPLASTY USING Rueben Bash BIOLOGIC PATCH;  Surgeon: Serafina Mitchell, MD;  Location: [facility];  Service: Vascular;  Laterality: Left;   Social History   Tobacco Use  . Smoking status: Former Smoker    Last attempt to quit: 01/04/1969    Years since quitting: 49.5  . Smokeless tobacco: Never Used  Substance Use Topics  . Alcohol use: No   family history includes Cancer (age of onset: 34) in his  father; Heart disease in his  mother; Stroke (age of onset: 59) in his brother.  Medications: Current Outpatient Medications  Medication Sig Dispense Refill  . allopurinol (ZYLOPRIM) 100 MG tablet Take 1 tablet (100 mg total) by mouth daily. 90 tablet 0  . aspirin EC 81 MG EC tablet Take 1 tablet (81 mg total) by mouth daily. 90 tablet 3  . atorvastatin (LIPITOR) 80 MG tablet Take 1 tablet (80 mg total) by mouth daily. 90 tablet 3  . brinzolamide (AZOPT) 1 % ophthalmic suspension Place 1 drop into both eyes 2 (two) times daily. 10 mL 1  . colchicine 0.6 MG tablet Take 0.5 tablets (0.3 mg total) by mouth daily. Daily for gout prevention for 2 months while starting allopurinol 30 tablet 2  . diclofenac sodium (VOLTAREN) 1 % GEL Apply 2 g topically 4 (four) times daily. To affected joint. 100 g 11  . dorzolamide (TRUSOPT) 2 % ophthalmic solution INSTILL 1 DROP INTO EACH EYE 3 TIMES A DAY  6  . hydrALAZINE (APRESOLINE) 50 MG tablet Take 1 tablet (50 mg total) by mouth 2 (two) times daily. Lunch and dinner 180 tablet 1  . indomethacin (INDOCIN) 50 MG capsule Take 1 capsule (50 mg total) by mouth 2 (two) times daily with a meal. 60 capsule 0  . latanoprost (XALATAN) 0.005 % ophthalmic solution     . metoprolol succinate (TOPROL-XL) 25 MG 24 hr tablet TAKE 1 TABLET BY MOUTH TWICE DAILY 60 tablet 2  . Multiple Vitamin (MULTIVITAMIN) tablet Take 1 tablet by mouth daily.    . Probiotic Product (PROBIOTIC PO) Take 1 tablet by mouth daily.    [name] zolpidem (AMBIEN) 10 MG tablet TAKE ONE-HALF TO 1 TABLET BY MOUTH AT BEDTIME AS NEEDED FOR SLEEP 30 tablet 0  . amLODipine (NORVASC) 10 MG tablet Take 1 tablet (10 mg total) by mouth daily. 30 tablet 11  . lisinopril (PRINIVIL,ZESTRIL) 20 MG tablet Take 1 tablet (20 mg total) by mouth 2 (two) times daily. 180 tablet 1  . PHENobarbital (LUMINAL) 32.4 MG tablet TAKE 2 TABLETS BY MOUTH ONCE DAILY AT BEDTIME 60 tablet 3   No current facility-administered medications for  this visit.    No Known Allergies   Discussed warning signs or symptoms. Please see discharge instructions. Patient expresses understanding.

## 2018-07-02 NOTE — Telephone Encounter (Signed)
Unable to locate RX.   RX re-printed and handed to pt at [facility] with Dr Georgina Snell.

## 2018-07-03 LAB — CBC
HCT: 38.5 % (ref 38.5–50.0)
Hemoglobin: 13.2 g/dL (ref 13.2–17.1)
MCH: 32.4 pg (ref 27.0–33.0)
MCHC: 34.3 g/dL (ref 32.0–36.0)
MCV: 94.4 fL (ref 80.0–100.0)
MPV: 9.5 fL (ref 7.5–12.5)
PLATELETS: 275 10*3/uL (ref 140–400)
RBC: 4.08 10*6/uL — ABNORMAL LOW (ref 4.20–5.80)
RDW: 12.9 % (ref 11.0–15.0)
WBC: 8.3 10*3/uL (ref 3.8–10.8)

## 2018-07-03 LAB — RENAL FUNCTION PANEL
Albumin: 4.3 g/dL (ref 3.6–5.1)
BUN/Creatinine Ratio: 16 (calc) (ref 6–22)
BUN: 28 mg/dL — AB (ref 7–25)
CO2: 27 mmol/L (ref 20–32)
CREATININE: 1.76 mg/dL — AB (ref 0.70–1.11)
Calcium: 9.1 mg/dL (ref 8.6–10.3)
Chloride: 106 mmol/L (ref 98–110)
Glucose, Bld: 117 mg/dL — ABNORMAL HIGH (ref 65–99)
POTASSIUM: 4.5 mmol/L (ref 3.5–5.3)
Phosphorus: 3.9 mg/dL (ref 2.1–4.3)
SODIUM: 140 mmol/L (ref 135–146)

## 2018-07-04 ENCOUNTER — Other Ambulatory Visit: Payer: Self-pay | Admitting: Cardiovascular Disease

## 2018-07-04 NOTE — Telephone Encounter (Signed)
Spoke with patient as he has not been seen in over 2 years. He states that this should have been sent to his pcp for authorization. He is aware that I will deny the request and send it back to the pharmacy with a note that it should be deferred to his pcp. He thanked me for the call.

## 2018-07-10 ENCOUNTER — Ambulatory Visit (INDEPENDENT_AMBULATORY_CARE_PROVIDER_SITE_OTHER): Payer: Medicare HMO | Admitting: Osteopathic Medicine

## 2018-07-10 ENCOUNTER — Encounter: Payer: Self-pay | Admitting: Osteopathic Medicine

## 2018-07-10 VITALS — BP 128/54 | HR 61 | Temp 97.6°F | Wt 185.9 lb

## 2018-07-10 DIAGNOSIS — I951 Orthostatic hypotension: Secondary | ICD-10-CM

## 2018-07-10 DIAGNOSIS — I150 Renovascular hypertension: Secondary | ICD-10-CM | POA: Diagnosis not present

## 2018-07-10 DIAGNOSIS — N183 Chronic kidney disease, stage 3 unspecified: Secondary | ICD-10-CM

## 2018-07-10 MED ORDER — ATORVASTATIN CALCIUM 80 MG PO TABS: 80.0000 mg | ORAL_TABLET | Freq: Every day | ORAL | 3 refills | Status: AC

## 2018-07-10 MED ORDER — METOPROLOL SUCCINATE ER 25 MG PO TB24: 25.0000 mg | ORAL_TABLET | Freq: Two times a day (BID) | ORAL | 3 refills | Status: AC

## 2018-07-10 NOTE — Patient Instructions (Signed)
Orthostatic Hypotension Orthostatic hypotension is a sudden drop in blood pressure that happens when you quickly change positions, such as when you get up from a seated or lying position. Blood pressure is a measurement of how strongly, or weakly, your blood is pressing against the walls of your arteries. Arteries are blood vessels that carry blood from your heart throughout your body. When blood pressure is too low, you may not get enough blood to your brain or to the rest of your organs. This can cause weakness, light-headedness, rapid heartbeat, and fainting. This can last for just a few seconds or for up to a few minutes. Orthostatic hypotension is usually not a serious problem. However, if it happens frequently or gets worse, it may be a sign of something more serious. What are the causes? This condition may be caused by:  Sudden changes in posture, such as standing up quickly after you have been sitting or lying down.  Blood loss.  Loss of body fluids (dehydration).  Heart problems.  Hormone (endocrine) problems.  Pregnancy.  Severe infection.  Lack of certain nutrients.  Severe allergic reactions (anaphylaxis).  Certain medicines, such as blood pressure medicine or medicines that make the body lose excess fluids (diuretics). Sometimes, this condition can be caused by not taking medicine as directed, such as taking too much of a certain medicine.  What increases the risk? Certain factors can make you more likely to develop orthostatic hypotension, including:  Age. Risk increases as you get older.  Conditions that affect the heart or the central nervous system.  Taking certain medicines, such as blood pressure medicine or diuretics.  Being pregnant.  What are the signs or symptoms? Symptoms of this condition may include:  Weakness.  Light-headedness.  Dizziness.  Blurred vision.  Fatigue.  Rapid heartbeat.  Fainting, in severe cases.  How is this  diagnosed? This condition is diagnosed based on:  Your medical history.  Your symptoms.  Your blood pressure measurement. Your health care provider will check your blood pressure when you are: ? Lying down. ? Sitting. ? Standing.  A blood pressure reading is recorded as two numbers, such as "120 over 80" (or 120/80). The first ("top") number is called the systolic pressure. It is a measure of the pressure in your arteries as your heart beats. The second ("bottom") number is called the diastolic pressure. It is a measure of the pressure in your arteries when your heart relaxes between beats. Blood pressure is measured in a unit called mm Hg. Healthy blood pressure for adults is 120/80. If your blood pressure is below 90/60, you may be diagnosed with hypotension. Other information or tests that may be used to diagnose orthostatic hypotension include:  Your other vital signs, such as your heart rate and temperature.  Blood tests.  Tilt table test. For this test, you will be safely secured to a table that moves you from a lying position to an upright position. Your heart rhythm and blood pressure will be monitored during the test.  How is this treated? Treatment for this condition may include:  Changing your diet. This may involve eating more salt (sodium) or drinking more water.  Taking medicines to raise your blood pressure.  Changing the dosage of certain medicines you are taking that might be lowering your blood pressure.  Wearing compression stockings. These stockings help to prevent blood clots and reduce swelling in your legs.  In some cases, you may need to go to the hospital for:    Fluid replacement. This means you will receive fluids through an IV tube.  Blood replacement. This means you will receive donated blood through an IV tube (transfusion).  Treating an infection or heart problems, if this applies.  Monitoring. You may need to be monitored while medicines that you  are taking wear off.  Follow these instructions at home: Eating and drinking   Drink enough fluid to keep your urine clear or pale yellow.  Eat a healthy diet and follow instructions from your health care provider about eating or drinking restrictions. A healthy diet includes: ? Fresh fruits and vegetables. ? Whole grains. ? Lean meats. ? Low-fat dairy products.  Eat extra salt only as directed. Do not add extra salt to your diet unless your health care provider told you to do that.  Eat frequent, small meals.  Avoid standing up suddenly after eating. Medicines  Take over-the-counter and prescription medicines only as told by your health care provider. ? Follow instructions from your health care provider about changing the dosage of your current medicines, if this applies. ? Do not stop or adjust any of your medicines on your own. General instructions  Wear compression stockings as told by your health care provider.  Get up slowly from lying down or sitting positions. This gives your blood pressure a chance to adjust.  Avoid hot showers and excessive heat as directed by your health care provider.  Return to your normal activities as told by your health care provider. Ask your health care provider what activities are safe for you.  Do not use any products that contain nicotine or tobacco, such as cigarettes and e-cigarettes. If you need help quitting, ask your health care provider.  Keep all follow-up visits as told by your health care provider. This is important. Contact a health care provider if:  You vomit.  You have diarrhea.  You have a fever for more than 2-3 days.  You feel more thirsty than usual.  You feel weak and tired. Get help right away if:  You have chest pain.  You have a fast or irregular heartbeat.  You develop numbness in any part of your body.  You cannot move your arms or your legs.  You have trouble speaking.  You become sweaty or feel  lightheaded.  You faint.  You feel short of breath.  You have trouble staying awake.  You feel confused. This information is not intended to replace advice given to you by your health care provider. Make sure you discuss any questions you have with your health care provider. Document Released: 08/24/2002 Document Revised: 05/22/2016 Document Reviewed: 02/24/2016 [employer] Interactive Patient Education  2018 [employer]  

## 2018-07-10 NOTE — Progress Notes (Signed)
HPI: Brad James is a 84 y.o. male who  has a past medical history of CAD (coronary artery disease) (01/04/2014), Cancer [facility]), Carotid artery stenosis (01/04/2014), Dizzy spells, Essential hypertension, benign (01/04/2014), Glaucoma, Hyperlipidemia (01/04/2014), Hypertension, and Insomnia (01/04/2014).  he presents to [facility] today, 07/10/18,  for chief complaint of:  Dizziness   Long-standing history of dizziness, occasional BPPV/vertigo symptoms, lately seems to be more with standing up.  He is not really worried about it but his daughter wanted him to come orthostatics are quite positive particularly from sitting to standing lungs were reproduced with this.  Patient has not been eating or drinking, just a bit of a low appetite.  No dramatic weight loss.  Patient is accompanied by daughter who assists with history-taking.   Past medical history, surgical history, and family history reviewed.  Current medication list and allergy/intolerance information reviewed.   (See remainder of HPI, ROS, Phys Exam below)  Orthostatic VS for the past 24 hrs (Last 3 readings):  BP- Lying Pulse- Lying BP- Sitting Pulse- Sitting BP- Standing at 0 minutes Pulse- Standing at 0 minutes  07/10/18 1101 160/62 61 157/61 63 128/62 62    Wt Readings from Last 3 Encounters:  07/10/18 185 lb 14.4 oz (84.3 kg)  07/02/18 183 lb (83 kg)  05/15/18 193 lb (87.5 kg)       ASSESSMENT/PLAN:   Orthostatic hypotension -lifestyle modifications and good hydration were discussed, as well as medication compliance.  Let me know if significant worsening.  Patient is not too concerned about his symptoms and would like to just hold off on seeing cardiology until his next scheduled appointment in January, unless things get worse.  He is advised to let me know if anything changes or if symptoms fail to improve with better hydration.  CKD (chronic kidney disease) stage 3, GFR 30-59 ml/min  (HCC) -stable on recent check with sports medicine  Secondary renovascular hypertension -has follow-up in place with cardiology   Meds ordered this encounter  Medications  . atorvastatin (LIPITOR) 80 MG tablet    Sig: Take 1 tablet (80 mg total) by mouth daily.    Dispense:  90 tablet    Refill:  3  . metoprolol succinate (TOPROL-XL) 25 MG 24 hr tablet    Sig: Take 1 tablet (25 mg total) by mouth 2 (two) times daily.    Dispense:  180 tablet    Refill:  3    Please consider 90 day supplies to promote better adherence    Patient Instructions  Orthostatic Hypotension Orthostatic hypotension is a sudden drop in blood pressure that happens when you quickly change positions, such as when you get up from a seated or lying position. Blood pressure is a measurement of how strongly, or weakly, your blood is pressing against the walls of your arteries. Arteries are blood vessels that carry blood from your heart throughout your body. When blood pressure is too low, you may not get enough blood to your brain or to the rest of your organs. This can cause weakness, light-headedness, rapid heartbeat, and fainting. This can last for just a few seconds or for up to a few minutes. Orthostatic hypotension is usually not a serious problem. However, if it happens frequently or gets worse, it may be a sign of something more serious. What are the causes? This condition may be caused by:  Sudden changes in posture, such as standing up quickly after you have been sitting or lying  down.  Blood loss.  Loss of body fluids (dehydration).  Heart problems.  Hormone (endocrine) problems.  Pregnancy.  Severe infection.  Lack of certain nutrients.  Severe allergic reactions (anaphylaxis).  Certain medicines, such as blood pressure medicine or medicines that make the body lose excess fluids (diuretics). Sometimes, this condition can be caused by not taking medicine as directed, such as taking too much of a  certain medicine.  What increases the risk? Certain factors can make you more likely to develop orthostatic hypotension, including:  Age. Risk increases as you get older.  Conditions that affect the heart or the central nervous system.  Taking certain medicines, such as blood pressure medicine or diuretics.  Being pregnant.  What are the signs or symptoms? Symptoms of this condition may include:  Weakness.  Light-headedness.  Dizziness.  Blurred vision.  Fatigue.  Rapid heartbeat.  Fainting, in severe cases.  How is this diagnosed? This condition is diagnosed based on:  Your medical history.  Your symptoms.  Your blood pressure measurement. Your health care provider will check your blood pressure when you are: ? Lying down. ? Sitting. ? Standing.  A blood pressure reading is recorded as two numbers, such as "120 over 80" (or 120/80). The first ("top") number is called the systolic pressure. It is a measure of the pressure in your arteries as your heart beats. The second ("bottom") number is called the diastolic pressure. It is a measure of the pressure in your arteries when your heart relaxes between beats. Blood pressure is measured in a unit called mm Hg. Healthy blood pressure for adults is 120/80. If your blood pressure is below 90/60, you may be diagnosed with hypotension. Other information or tests that may be used to diagnose orthostatic hypotension include:  Your other vital signs, such as your heart rate and temperature.  Blood tests.  Tilt table test. For this test, you will be safely secured to a table that moves you from a lying position to an upright position. Your heart rhythm and blood pressure will be monitored during the test.  How is this treated? Treatment for this condition may include:  Changing your diet. This may involve eating more salt (sodium) or drinking more water.  Taking medicines to raise your blood pressure.  Changing the  dosage of certain medicines you are taking that might be lowering your blood pressure.  Wearing compression stockings. These stockings help to prevent blood clots and reduce swelling in your legs.  In some cases, you may need to go to the hospital for:  Fluid replacement. This means you will receive fluids through an IV tube.  Blood replacement. This means you will receive donated blood through an IV tube (transfusion).  Treating an infection or heart problems, if this applies.  Monitoring. You may need to be monitored while medicines that you are taking wear off.  Follow these instructions at home: Eating and drinking   Drink enough fluid to keep your urine clear or pale yellow.  Eat a healthy diet and follow instructions from your health care provider about eating or drinking restrictions. A healthy diet includes: ? Fresh fruits and vegetables. ? Whole grains. ? Lean meats. ? Low-fat dairy products.  Eat extra salt only as directed. Do not add extra salt to your diet unless your health care provider told you to do that.  Eat frequent, small meals.  Avoid standing up suddenly after eating. Medicines  Take over-the-counter and prescription medicines only as told by  your health care provider. ? Follow instructions from your health care provider about changing the dosage of your current medicines, if this applies. ? Do not stop or adjust any of your medicines on your own. General instructions  Wear compression stockings as told by your health care provider.  Get up slowly from lying down or sitting positions. This gives your blood pressure a chance to adjust.  Avoid hot showers and excessive heat as directed by your health care provider.  Return to your normal activities as told by your health care provider. Ask your health care provider what activities are safe for you.  Do not use any products that contain nicotine or tobacco, such as cigarettes and e-cigarettes. If you  need help quitting, ask your health care provider.  Keep all follow-up visits as told by your health care provider. This is important. Contact a health care provider if:  You vomit.  You have diarrhea.  You have a fever for more than 2-3 days.  You feel more thirsty than usual.  You feel weak and tired. Get help right away if:  You have chest pain.  You have a fast or irregular heartbeat.  You develop numbness in any part of your body.  You cannot move your arms or your legs.  You have trouble speaking.  You become sweaty or feel lightheaded.  You faint.  You feel short of breath.  You have trouble staying awake.  You feel confused. This information is not intended to replace advice given to you by your health care provider. Make sure you discuss any questions you have with your health care provider. Document Released: 08/24/2002 Document Revised: 05/22/2016 Document Reviewed: 02/24/2016 Elsevier Interactive Patient Education  2018 [employer].    Follow-up plan: Return if symptoms worsen or fail to improve.                  ############################################ ############################################ ############################################ ############################################    Outpatient Encounter Medications as of 07/10/2018  Medication Sig  . allopurinol (ZYLOPRIM) 100 MG tablet Take 1 tablet (100 mg total) by mouth daily.  [name] amLODipine (NORVASC) 10 MG tablet Take 1 tablet (10 mg total) by mouth daily.  [name] aspirin EC 81 MG EC tablet Take 1 tablet (81 mg total) by mouth daily.  [name] atorvastatin (LIPITOR) 80 MG tablet Take 1 tablet (80 mg total) by mouth daily.  . brinzolamide (AZOPT) 1 % ophthalmic suspension Place 1 drop into both eyes 2 (two) times daily.  . colchicine 0.6 MG tablet Take 0.5 tablets (0.3 mg total) by mouth daily. Daily for gout prevention for 2 months while starting allopurinol  . diclofenac  sodium (VOLTAREN) 1 % GEL Apply 2 g topically 4 (four) times daily. To affected joint.  . dorzolamide (TRUSOPT) 2 % ophthalmic solution INSTILL 1 DROP INTO EACH EYE 3 TIMES A DAY  . hydrALAZINE (APRESOLINE) 50 MG tablet Take 1 tablet (50 mg total) by mouth 2 (two) times daily. Lunch and dinner  . indomethacin (INDOCIN) 50 MG capsule Take 1 capsule (50 mg total) by mouth 2 (two) times daily with a meal.  . latanoprost (XALATAN) 0.005 % ophthalmic solution   . lisinopril (PRINIVIL,ZESTRIL) 20 MG tablet Take 1 tablet (20 mg total) by mouth 2 (two) times daily.  . metoprolol succinate (TOPROL-XL) 25 MG 24 hr tablet TAKE 1 TABLET BY MOUTH TWICE DAILY  . Multiple Vitamin (MULTIVITAMIN) tablet Take 1 tablet by mouth daily.  [name] PHENobarbital (LUMINAL) 32.4 MG tablet TAKE 2  TABLETS BY MOUTH ONCE DAILY AT BEDTIME  . Probiotic Product (PROBIOTIC PO) Take 1 tablet by mouth daily.  [name] zolpidem (AMBIEN) 10 MG tablet TAKE ONE-HALF TO 1 TABLET BY MOUTH AT BEDTIME AS NEEDED FOR SLEEP   No facility-administered encounter medications on file as of 07/10/2018.    No Known Allergies    Review of Systems:  Constitutional: No recent illness  HEENT: No  headache, no vision change  Cardiac: No  chest pain, No  pressure, No palpitations  Respiratory:  No  shortness of breath. No  Cough  Gastrointestinal: No  abdominal pain, no change on bowel habits  Musculoskeletal: No new myalgia/arthralgia  Skin: No  Rash  Hem/Onc: No  easy bruising/bleeding  Neurologic: No  weakness, +Dizziness  Psychiatric: No  concerns with depression, No  concerns with anxiety  Exam:  BP (!) 128/54 (BP Location: Left Arm, Patient Position: Sitting, Cuff Size: Normal)   Pulse 61   Temp 97.6 F (36.4 C) (Oral)   Wt 185 lb 14.4 oz (84.3 kg)   BMI 25.21 kg/m   Constitutional: VS see above. General Appearance: alert, well-developed, well-nourished, NAD  Eyes: Normal lids and conjunctive, non-icteric sclera  Ears, Nose,  Mouth, Throat: MMM, Normal external inspection ears/nares/mouth/lips/gums.  Neck: No masses, trachea midline.   Respiratory: Normal respiratory effort. no wheeze, no rhonchi, no rales  Cardiovascular: S1/S2 normal, no murmur, no rub/gallop auscultated. RRR.  No carotid bruit or JVD.  No lower extremity edema  Musculoskeletal: Gait normal. Symmetric and independent movement of all extremities  Neurological: Normal balance/coordination. No tremor.  Skin: warm, dry, intact.   Psychiatric: Normal judgment/insight. Normal mood and affect. Oriented x3.   Visit summary with medication list and pertinent instructions was printed for patient to review, advised to alert [place] if any changes needed. All questions at time of visit were answered - patient instructed to contact office with any additional concerns. ER/RTC precautions were reviewed with the patient and understanding verbalized.   Follow-up plan: Return if symptoms worsen or fail to improve.  Note: Total time spent 25 minutes, greater than 50% of the visit was spent face-to-face counseling and coordinating care for the following: The primary encounter diagnosis was Orthostatic hypotension. Diagnoses of CKD (chronic kidney disease) stage 3, GFR 30-59 ml/min (HCC) and Secondary renovascular hypertension were also pertinent to this visit.[name]  Please note: voice recognition software was used to produce this document, and typos may escape review. Please contact Dr. Sheppard Coil for any needed clarifications.

## 2018-07-21 DIAGNOSIS — Z79899 Other long term (current) drug therapy: Secondary | ICD-10-CM | POA: Diagnosis not present

## 2018-07-21 DIAGNOSIS — Z85828 Personal history of other malignant neoplasm of skin: Secondary | ICD-10-CM | POA: Diagnosis not present

## 2018-07-21 DIAGNOSIS — B353 Tinea pedis: Secondary | ICD-10-CM | POA: Diagnosis not present

## 2018-07-21 DIAGNOSIS — Z08 Encounter for follow-up examination after completed treatment for malignant neoplasm: Secondary | ICD-10-CM | POA: Diagnosis not present

## 2018-07-21 DIAGNOSIS — D0422 Carcinoma in situ of skin of left ear and external auricular canal: Secondary | ICD-10-CM | POA: Diagnosis not present

## 2018-07-23 DIAGNOSIS — Z79899 Other long term (current) drug therapy: Secondary | ICD-10-CM | POA: Diagnosis not present

## 2018-07-25 ENCOUNTER — Other Ambulatory Visit: Payer: Self-pay | Admitting: Osteopathic Medicine

## 2018-08-03 DIAGNOSIS — H9542 Postprocedural hemorrhage and hematoma of ear and mastoid process following other procedure: Secondary | ICD-10-CM | POA: Diagnosis not present

## 2018-08-03 DIAGNOSIS — M109 Gout, unspecified: Secondary | ICD-10-CM | POA: Diagnosis not present

## 2018-08-03 DIAGNOSIS — I739 Peripheral vascular disease, unspecified: Secondary | ICD-10-CM | POA: Diagnosis not present

## 2018-08-03 DIAGNOSIS — Z79899 Other long term (current) drug therapy: Secondary | ICD-10-CM | POA: Diagnosis not present

## 2018-08-03 DIAGNOSIS — H409 Unspecified glaucoma: Secondary | ICD-10-CM | POA: Diagnosis not present

## 2018-08-03 DIAGNOSIS — Z87891 Personal history of nicotine dependence: Secondary | ICD-10-CM | POA: Diagnosis not present

## 2018-08-03 DIAGNOSIS — I2510 Atherosclerotic heart disease of native coronary artery without angina pectoris: Secondary | ICD-10-CM | POA: Diagnosis not present

## 2018-08-03 DIAGNOSIS — N189 Chronic kidney disease, unspecified: Secondary | ICD-10-CM | POA: Diagnosis not present

## 2018-08-03 DIAGNOSIS — R234 Changes in skin texture: Secondary | ICD-10-CM | POA: Diagnosis not present

## 2018-08-03 DIAGNOSIS — I129 Hypertensive chronic kidney disease with stage 1 through stage 4 chronic kidney disease, or unspecified chronic kidney disease: Secondary | ICD-10-CM | POA: Diagnosis not present

## 2018-08-05 DIAGNOSIS — H02112 Cicatricial ectropion of right lower eyelid: Secondary | ICD-10-CM | POA: Diagnosis not present

## 2018-08-05 DIAGNOSIS — H02115 Cicatricial ectropion of left lower eyelid: Secondary | ICD-10-CM | POA: Diagnosis not present

## 2018-08-05 DIAGNOSIS — H527 Unspecified disorder of refraction: Secondary | ICD-10-CM | POA: Diagnosis not present

## 2018-08-05 DIAGNOSIS — H401122 Primary open-angle glaucoma, left eye, moderate stage: Secondary | ICD-10-CM | POA: Diagnosis not present

## 2018-08-05 DIAGNOSIS — H02831 Dermatochalasis of right upper eyelid: Secondary | ICD-10-CM | POA: Diagnosis not present

## 2018-08-05 DIAGNOSIS — H02834 Dermatochalasis of left upper eyelid: Secondary | ICD-10-CM | POA: Diagnosis not present

## 2018-08-05 DIAGNOSIS — Z961 Presence of intraocular lens: Secondary | ICD-10-CM | POA: Diagnosis not present

## 2018-08-05 DIAGNOSIS — H401111 Primary open-angle glaucoma, right eye, mild stage: Secondary | ICD-10-CM | POA: Diagnosis not present

## 2018-08-25 ENCOUNTER — Other Ambulatory Visit: Payer: Self-pay | Admitting: Osteopathic Medicine

## 2018-09-01 ENCOUNTER — Encounter: Payer: Self-pay | Admitting: Osteopathic Medicine

## 2018-09-01 ENCOUNTER — Ambulatory Visit (INDEPENDENT_AMBULATORY_CARE_PROVIDER_SITE_OTHER): Payer: Medicare HMO | Admitting: Osteopathic Medicine

## 2018-09-01 VITALS — BP 150/66 | HR 54 | Temp 97.9°F | Wt 187.2 lb

## 2018-09-01 DIAGNOSIS — M1A0491 Idiopathic chronic gout, unspecified hand, with tophus (tophi): Secondary | ICD-10-CM

## 2018-09-01 DIAGNOSIS — N183 Chronic kidney disease, stage 3 unspecified: Secondary | ICD-10-CM

## 2018-09-01 DIAGNOSIS — M10042 Idiopathic gout, left hand: Secondary | ICD-10-CM | POA: Diagnosis not present

## 2018-09-01 DIAGNOSIS — I10 Essential (primary) hypertension: Secondary | ICD-10-CM | POA: Diagnosis not present

## 2018-09-01 NOTE — Patient Instructions (Addendum)
Will check labs today and adjust medicine if needed Please confirm whether you are taking the allopurinol!   Plan a visit sometime in 2020 with out nurse, Maudie Mercury, for Medicare Wellness visit and can do Annual Physical with me same day or soon after.

## 2018-09-01 NOTE — Progress Notes (Signed)
HPI: Brad James is a 84 y.o. male who  has a past medical history of CAD (coronary artery disease) (01/04/2014), Cancer [facility]), Carotid artery stenosis (01/04/2014), Dizzy spells, Essential hypertension, benign (01/04/2014), Glaucoma, Hyperlipidemia (01/04/2014), Hypertension, and Insomnia (01/04/2014).  he presents to [facility] today, 09/01/18,  for chief complaint of:  Gout  Saw Dr Georgina Snell about 3 mos ago for gouty pain in finger w. Tophi. Uric acid was 9.2, he had not started allopurinol, he had relief from colchicine. Creatinine 04/2018 at 1.9, was 1.7 06/2018.  He is pretty sure that he is taking the allopurinol, but is not positive.    At today's visit... Past medical history, surgical history, and family history reviewed and updated as needed.  Current medication list and allergy/intolerance information reviewed and updated as needed. (See remainder of HPI, ROS, Phys Exam below)     Results for orders placed or performed in visit on 09/01/18 (from the past 72 hour(s))  Uric acid     Status: None   Collection Time: 09/01/18  2:21 PM  Result Value Ref Range   Uric Acid, Serum 6.3 4.0 - 8.0 mg/dL    Comment: Therapeutic target for gout patients: <6.0 mg/dL .   BASIC METABOLIC PANEL WITH GFR     Status: Abnormal   Collection Time: 09/01/18  2:21 PM  Result Value Ref Range   Glucose, Bld 84 65 - 99 mg/dL    Comment: .            Fasting reference interval .    BUN 29 (H) 7 - 25 mg/dL   Creat 1.66 (H) 0.70 - 1.11 mg/dL    Comment: For patients >41 years of age, the reference limit for Creatinine is approximately 13% higher for people identified as African-American. .    GFR, Est Non African American 36 (L) > OR = 60 mL/min/1.44m2   GFR, Est African American 42 (L) > OR = 60 mL/min/1.53m2   BUN/Creatinine Ratio 17 6 - 22 (calc)   Sodium 143 135 - 146 mmol/L   Potassium 4.4 3.5 - 5.3 mmol/L   Chloride 108 98 - 110 mmol/L   CO2 27 20 -  32 mmol/L   Calcium 9.5 8.6 - 10.3 mg/dL          ASSESSMENT/PLAN: The primary encounter diagnosis was CKD (chronic kidney disease) stage 3, GFR 30-59 ml/min (HCC). 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   Orders Placed This Encounter  Procedures  . Uric acid  . BASIC METABOLIC PANEL WITH GFR     Labs as above, uric acid not quite to goal but given kidney function and no subsequent flares, okay to continue allopurinol, stop colchicine   Patient Instructions  Will check labs today and adjust medicine if needed Please confirm whether you are taking the allopurinol!   Plan a visit sometime in 2020 with out nurse, Maudie Mercury, for Medicare Wellness visit and can do Annual Physical with me same day or soon after.         Follow-up plan: Return for medicare AWV w/ Kim and can do follow-up with Dr A same day or soon after. .                             ############################################ ############################################ ############################################ ############################################    Current Meds  Medication Sig  .  allopurinol (ZYLOPRIM) 100 MG tablet Take 1 tablet (100 mg total) by mouth daily.  [name] aspirin EC 81 MG EC tablet Take 1 tablet (81 mg total) by mouth daily.  [name] atorvastatin (LIPITOR) 80 MG tablet Take 1 tablet (80 mg total) by mouth daily.  . brinzolamide (AZOPT) 1 % ophthalmic suspension Place 1 drop into both eyes 2 (two) times daily.  . colchicine 0.6 MG tablet Take 0.5 tablets (0.3 mg total) by mouth daily. Daily for gout prevention for 2 months while starting allopurinol  . diclofenac sodium (VOLTAREN) 1 % GEL Apply 2 g topically 4 (four) times daily. To affected joint.  . dorzolamide (TRUSOPT) 2 % ophthalmic solution INSTILL 1 DROP INTO EACH EYE 3 TIMES A DAY  . hydrALAZINE  (APRESOLINE) 50 MG tablet Take 1 tablet (50 mg total) by mouth 2 (two) times daily. Lunch and dinner  . indomethacin (INDOCIN) 50 MG capsule Take 1 capsule (50 mg total) by mouth 2 (two) times daily with a meal.  . latanoprost (XALATAN) 0.005 % ophthalmic solution   . metoprolol succinate (TOPROL-XL) 25 MG 24 hr tablet Take 1 tablet (25 mg total) by mouth 2 (two) times daily.  . Multiple Vitamin (MULTIVITAMIN) tablet Take 1 tablet by mouth daily.  [name] PHENobarbital (LUMINAL) 32.4 MG tablet TAKE 2 TABLETS BY MOUTH ONCE DAILY AT BEDTIME  . Probiotic Product (PROBIOTIC PO) Take 1 tablet by mouth daily.  [name] zolpidem (AMBIEN) 10 MG tablet TAKE 1/2 TO 1 (ONE-HALF TO ONE) TABLET BY MOUTH ONCE DAILY AT BEDTIME AS NEEDED FOR SLEEP    No Known Allergies     Review of Systems:  Constitutional: No recent illness  Cardiac: No  chest pain, No  pressure, No palpitations  Respiratory:  No  shortness of breath. No  Cough  Musculoskeletal: No new myalgia/arthralgia  Neurologic: No  weakness, No  Dizziness   Exam:  BP (!) 150/66 (BP Location: Left Arm, Patient Position: Sitting, Cuff Size: Normal)   Pulse (!) 54   Temp 97.9 F (36.6 C) (Oral)   Wt 187 lb 3.2 oz (84.9 kg)   BMI 25.39 kg/m   Constitutional: VS see above. General Appearance: alert, well-developed, well-nourished, NAD  Eyes: Normal lids and conjunctive, non-icteric sclera  Ears, Nose, Mouth, Throat: MMM, Normal external inspection ears/nares/mouth/lips/gums.  Neck: No masses, trachea midline.   Respiratory: Normal respiratory effort. no wheeze, no rhonchi, no rales  Cardiovascular: S1/S2 normal, no murmur, no rub/gallop auscultated. RRR.   Musculoskeletal: Gait normal. Symmetric and independent movement of all extremities  Neurological: Normal balance/coordination. No tremor.  Skin: warm, dry, intact.   Psychiatric: Normal judgment/insight. Normal mood and affect. Oriented x3.       Visit summary with medication  list and pertinent instructions was printed for patient to review, patient was advised to alert [place] if any updates are needed. All questions at time of visit were answered - patient instructed to contact office with any additional concerns. ER/RTC precautions were reviewed with the patient and understanding verbalized.   Note: Total time spent 15 minutes, greater than 50% of the visit was spent face-to-face counseling and coordinating care for the following: The primary encounter diagnosis was CKD (chronic kidney disease) stage 3, GFR 30-59 ml/min ([place]). 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 Please contact Dr. Sheppard Coil for any  needed clarifications.    Follow up plan: Return for medicare AWV w/ Kim and can do follow-up with Dr A same day or soon after. [name]

## 2018-09-02 LAB — BASIC METABOLIC PANEL WITH GFR
BUN/Creatinine Ratio: 17 (calc) (ref 6–22)
BUN: 29 mg/dL — AB (ref 7–25)
CALCIUM: 9.5 mg/dL (ref 8.6–10.3)
CHLORIDE: 108 mmol/L (ref 98–110)
CO2: 27 mmol/L (ref 20–32)
CREATININE: 1.66 mg/dL — AB (ref 0.70–1.11)
GFR, Est African American: 42 mL/min/{1.73_m2} — ABNORMAL LOW (ref >=60)
GFR, Est Non African American: 36 mL/min/{1.73_m2} — ABNORMAL LOW (ref >=60)
GLUCOSE: 84 mg/dL (ref 65–99)
Potassium: 4.4 mmol/L (ref 3.5–5.3)
Sodium: 143 mmol/L (ref 135–146)

## 2018-09-02 LAB — URIC ACID: URIC ACID, SERUM: 6.3 mg/dL (ref 4.0–8.0)

## 2018-09-08 ENCOUNTER — Other Ambulatory Visit: Payer: Self-pay | Admitting: Osteopathic Medicine

## 2018-09-08 DIAGNOSIS — I150 Renovascular hypertension: Secondary | ICD-10-CM

## 2018-09-18 ENCOUNTER — Encounter: Payer: Self-pay | Admitting: Family Medicine

## 2018-09-18 ENCOUNTER — Ambulatory Visit: Payer: Medicare HMO | Admitting: Family Medicine

## 2018-09-18 VITALS — BP 196/72 | HR 73 | Wt 187.0 lb

## 2018-09-18 DIAGNOSIS — R109 Unspecified abdominal pain: Secondary | ICD-10-CM

## 2018-09-18 DIAGNOSIS — M546 Pain in thoracic spine: Secondary | ICD-10-CM

## 2018-09-18 NOTE — Progress Notes (Signed)
Brad James is a 85 y.o. male who presents to [facility] today for right thoracic pain/flank pain.  Brad James notes a 3-week history of pain in his right flank/right lateral thoracic area.  This is worse with arm and trunk motion.  He denies significant change with urination.  No fevers chills nausea vomiting or diarrhea.  He has a history of kidney stones seen on ultrasound but does not think his symptoms are consistent with kidney stone today.  He is tried some over-the-counter medicines for pain control which have helped.  He denies any vomiting or diarrhea chest pain or palpitations.   ROS as above:  Exam:  BP (!) 196/72   Pulse 73   Wt 187 lb (84.8 kg)   BMI 25.36 kg/m  Wt Readings from Last 5 Encounters:  09/18/18 187 lb (84.8 kg)  09/01/18 187 lb 3.2 oz (84.9 kg)  07/10/18 185 lb 14.4 oz (84.3 kg)  07/02/18 183 lb (83 kg)  05/15/18 193 lb (87.5 kg)    Gen: Well NAD HEENT: EOMI,  MMM Lungs: Normal work of breathing. CTABL Heart: RRR no MRG Abd: NABS, Soft. Nondistended, minimally tender to percussion right CVA angle.  Nontender left. Exts: Brisk capillary refill, warm and well perfused.  MSK: Nontender to spinal midline.  Mildly tender palpation right thoracic paraspinal musculature and into the right lateral ribs.  No crepitations palpated.  Trunk motion reproduces pain a bit.  Lab and Radiology Results Patient unable to provide urine sample at the time of the visit   Assessment and Plan: 85 y.o. male with  Right flank pain/trunk pain.  Etiology somewhat unclear.  Unfortunately by the time patient was discharged from clinic the lab is closed and he had just urinated prior to the visit.  I think he benefit from a bit further work-up including urinalysis CBC metabolic panel and renal ultrasound.  This would help to evaluate and rule out several  potential diagnoses including hydronephrosis, UTI/pyelonephritis.  Additionally however I think the majority of his pain is likely coming from muscle spasm and dysfunction.  He would likely benefit from physical therapy.  Plan to refer to physical therapy.  Recheck in the near future.  PDMP not reviewed this encounter. Orders Placed This Encounter  Procedures  . US Renal    Standing Status:   Future    Standing Expiration Date:   11/17/2019    Order Specific Question:   Reason for Exam (SYMPTOM  OR DIAGNOSIS REQUIRED)    Answer:   eval right flank pain. r/u hydronephrosis    Order Specific Question:   Preferred imaging location?    Answer:   [facility]  . Urinalysis, Routine w reflex microscopic  . CBC  . COMPLETE METABOLIC PANEL WITH GFR  . Ambulatory referral to Physical Therapy    Referral Priority:   Routine    Referral Type:   Physical Medicine    Referral Reason:   Specialty Services Required    Requested Specialty:   Physical Therapy   No orders of the defined types were placed in this encounter.    Historical information moved to improve visibility of documentation.  Past Medical History:  Diagnosis Date  . CAD (coronary artery disease) 01/04/2014  . Cancer ([place])    skin  . Carotid artery stenosis 01/04/2014  . Dizzy spells    At times per pt  . Essential hypertension, benign 01/04/2014  . Glaucoma  Bilateral  . Hyperlipidemia 01/04/2014  . Hypertension   . Insomnia 01/04/2014   Past Surgical History:  Procedure Laterality Date  . COLONOSCOPY    . CORONARY ANGIOPLASTY WITH STENT PLACEMENT     stated 3-4 over past 10 yrs.   [name] ENDARTERECTOMY Left 04/18/2016   Procedure: LEFT CAROTID ENDARTERECTOMY;  Surgeon: Serafina Mitchell, MD;  Location: [facility];  Service: Vascular;  Laterality: Left;  [name] MELANOMA EXCISION    . PATCH ANGIOPLASTY Left 04/18/2016   Procedure: PATCH ANGIOPLASTY USING Rueben Bash BIOLOGIC PATCH;  Surgeon: Serafina Mitchell, MD;  Location: [facility] OR;   Service: Vascular;  Laterality: Left;   Social History   Tobacco Use  . Smoking status: Former Smoker    Last attempt to quit: 01/04/1969    Years since quitting: 49.7  . Smokeless tobacco: Never Used  Substance Use Topics  . Alcohol use: No   family history includes Cancer (age of onset: 5) in his father; Heart disease in his mother; Stroke (age of onset: 49) in his brother.  Medications: Current Outpatient Medications  Medication Sig Dispense Refill  . allopurinol (ZYLOPRIM) 100 MG tablet Take 1 tablet (100 mg total) by mouth daily. 90 tablet 0  . aspirin EC 81 MG EC tablet Take 1 tablet (81 mg total) by mouth daily. 90 tablet 3  . atorvastatin (LIPITOR) 80 MG tablet Take 1 tablet (80 mg total) by mouth daily. 90 tablet 3  . brinzolamide (AZOPT) 1 % ophthalmic suspension Place 1 drop into both eyes 2 (two) times daily. 10 mL 1  . diclofenac sodium (VOLTAREN) 1 % GEL Apply 2 g topically 4 (four) times daily. To affected joint. 100 g 11  . dorzolamide (TRUSOPT) 2 % ophthalmic solution INSTILL 1 DROP INTO EACH EYE 3 TIMES A DAY  6  . hydrALAZINE (APRESOLINE) 50 MG tablet TAKE 1 TABLET BY MOUTH TWICE DAILY WITH  LUNCH  AND  DINNER 180 tablet 0  . indomethacin (INDOCIN) 50 MG capsule Take 1 capsule (50 mg total) by mouth 2 (two) times daily with a meal. 60 capsule 0  . latanoprost (XALATAN) 0.005 % ophthalmic solution     . metoprolol succinate (TOPROL-XL) 25 MG 24 hr tablet Take 1 tablet (25 mg total) by mouth 2 (two) times daily. 180 tablet 3  . Multiple Vitamin (MULTIVITAMIN) tablet Take 1 tablet by mouth daily.    [name] PHENobarbital (LUMINAL) 32.4 MG tablet TAKE 2 TABLETS BY MOUTH ONCE DAILY AT BEDTIME 60 tablet 3  . Probiotic Product (PROBIOTIC PO) Take 1 tablet by mouth daily.    [name] zolpidem (AMBIEN) 10 MG tablet TAKE 1/2 TO 1 (ONE-HALF TO ONE) TABLET BY MOUTH ONCE DAILY AT BEDTIME AS NEEDED FOR SLEEP 30 tablet 0  . amLODipine (NORVASC) 10 MG tablet Take 1 tablet (10 mg total) by mouth  daily. 30 tablet 11  . lisinopril (PRINIVIL,ZESTRIL) 20 MG tablet Take 1 tablet (20 mg total) by mouth 2 (two) times daily. 180 tablet 1   No current facility-administered medications for this visit.    No Known Allergies   Discussed warning signs or symptoms. Please see discharge instructions. Patient expresses understanding.

## 2018-09-18 NOTE — Patient Instructions (Signed)
Thank you for coming in today. Get labs and ultrasound tomorrow.  Attend physical therapy.  Use a heating pad.  Let me know if not better.   Come back or go to the emergency room if you notice new weakness new numbness problems walking or bowel or bladder problems.   Flank Pain, Adult Flank pain is pain that is located on the side of the body between the upper abdomen and the back. This area is called the flank. The pain may occur over a short period of time (acute), or it may be long-term or recurring (chronic). It may be mild or severe. Flank pain can be caused by many things, including:  Muscle soreness or injury.  Kidney stones or kidney disease.  Stress.  A disease of the spine (vertebral disk disease).  A lung infection (pneumonia).  Fluid around the lungs (pulmonary edema).  A skin rash caused by the chickenpox virus (shingles).  Tumors that affect the back of the abdomen.  Gallbladder disease. Follow these instructions at home:   Drink enough fluid to keep your urine clear or pale yellow.  Rest as told by your health care provider.  Take over-the-counter and prescription medicines only as told by your health care provider.  Keep a journal to track what has caused your flank pain and what has made it feel better.  Keep all follow-up visits as told by your health care provider. This is important. Contact a health care provider if:  Your pain is not controlled with medicine.  You have new symptoms.  Your pain gets worse.  You have a fever.  Your symptoms last longer than 2-3 days.  You have trouble urinating or you are urinating very frequently. Get help right away if:  You have trouble breathing or you are short of breath.  Your abdomen hurts or it is swollen or red.  You have nausea or vomiting.  You feel faint or you pass out.  You have blood in your urine. Summary  Flank pain is pain that is located on the side of the body between the upper  abdomen and the back.  The pain may occur over a short period of time (acute), or it may be long-term or recurring (chronic). It may be mild or severe.  Flank pain can be caused by many things.  Contact your health care provider if your symptoms get worse or they last longer than 2-3 days. This information is not intended to replace advice given to you by your health care provider. Make sure you discuss any questions you have with your health care provider. Document Released: 10/25/2005 Document Revised: 11/16/2016 Document Reviewed: 11/16/2016 Elsevier Interactive Patient Education  2019 [employer].

## 2018-09-19 ENCOUNTER — Ambulatory Visit (INDEPENDENT_AMBULATORY_CARE_PROVIDER_SITE_OTHER): Payer: PRIVATE HEALTH INSURANCE

## 2018-09-19 DIAGNOSIS — M546 Pain in thoracic spine: Secondary | ICD-10-CM | POA: Diagnosis not present

## 2018-09-19 DIAGNOSIS — R109 Unspecified abdominal pain: Secondary | ICD-10-CM | POA: Diagnosis not present

## 2018-09-20 LAB — COMPLETE METABOLIC PANEL WITH GFR
AG RATIO: 1.8 (calc) (ref 1.0–2.5)
ALT: 11 U/L (ref 9–46)
AST: 18 U/L (ref 10–35)
Albumin: 3.9 g/dL (ref 3.6–5.1)
Alkaline phosphatase (APISO): 62 U/L (ref 40–115)
BUN/Creatinine Ratio: 15 (calc) (ref 6–22)
BUN: 27 mg/dL — ABNORMAL HIGH (ref 7–25)
CHLORIDE: 105 mmol/L (ref 98–110)
CO2: 26 mmol/L (ref 20–32)
Calcium: 8.9 mg/dL (ref 8.6–10.3)
Creat: 1.86 mg/dL — ABNORMAL HIGH (ref 0.70–1.11)
GFR, Est African American: 36 mL/min/{1.73_m2} — ABNORMAL LOW (ref >=60)
GFR, Est Non African American: 31 mL/min/{1.73_m2} — ABNORMAL LOW (ref >=60)
GLOBULIN: 2.2 g/dL (ref 1.9–3.7)
Glucose, Bld: 82 mg/dL (ref 65–99)
Potassium: 4.5 mmol/L (ref 3.5–5.3)
SODIUM: 139 mmol/L (ref 135–146)
Total Bilirubin: 0.4 mg/dL (ref 0.2–1.2)
Total Protein: 6.1 g/dL (ref 6.1–8.1)

## 2018-09-20 LAB — URINALYSIS, ROUTINE W REFLEX MICROSCOPIC
Bacteria, UA: NONE SEEN /HPF
Bilirubin Urine: NEGATIVE
Glucose, UA: NEGATIVE
Hgb urine dipstick: NEGATIVE
Nitrite: NEGATIVE
Specific Gravity, Urine: 1.025 (ref 1.001–1.03)
Squamous Epithelial / HPF: NONE SEEN /HPF (ref <=5)
WBC, UA: NONE SEEN /HPF (ref 0–5)
pH: 6 (ref 5.0–8.0)

## 2018-09-20 LAB — CBC
HEMATOCRIT: 34.8 % — AB (ref 38.5–50.0)
Hemoglobin: 11.9 g/dL — ABNORMAL LOW (ref 13.2–17.1)
MCH: 33.2 pg — ABNORMAL HIGH (ref 27.0–33.0)
MCHC: 34.2 g/dL (ref 32.0–36.0)
MCV: 97.2 fL (ref 80.0–100.0)
MPV: 9.3 fL (ref 7.5–12.5)
Platelets: 257 10*3/uL (ref 140–400)
RBC: 3.58 10*6/uL — ABNORMAL LOW (ref 4.20–5.80)
RDW: 12.8 % (ref 11.0–15.0)
WBC: 7.5 10*3/uL (ref 3.8–10.8)

## 2018-09-23 ENCOUNTER — Encounter: Payer: Self-pay | Admitting: Osteopathic Medicine

## 2018-09-23 ENCOUNTER — Ambulatory Visit (INDEPENDENT_AMBULATORY_CARE_PROVIDER_SITE_OTHER): Payer: PRIVATE HEALTH INSURANCE | Admitting: Osteopathic Medicine

## 2018-09-23 VITALS — BP 169/71 | HR 60 | Temp 98.2°F | Wt 188.2 lb

## 2018-09-23 DIAGNOSIS — R109 Unspecified abdominal pain: Secondary | ICD-10-CM | POA: Diagnosis not present

## 2018-09-23 DIAGNOSIS — D649 Anemia, unspecified: Secondary | ICD-10-CM | POA: Diagnosis not present

## 2018-09-23 DIAGNOSIS — I10 Essential (primary) hypertension: Secondary | ICD-10-CM

## 2018-09-23 DIAGNOSIS — I150 Renovascular hypertension: Secondary | ICD-10-CM

## 2018-09-23 DIAGNOSIS — N183 Chronic kidney disease, stage 3 unspecified: Secondary | ICD-10-CM

## 2018-09-23 DIAGNOSIS — M62838 Other muscle spasm: Secondary | ICD-10-CM

## 2018-09-23 MED ORDER — AMLODIPINE BESYLATE 10 MG PO TABS: 10.0000 mg | ORAL_TABLET | Freq: Every day | ORAL | 1 refills | Status: AC

## 2018-09-23 MED ORDER — TRAMADOL HCL 50 MG PO TABS: 50.0000 mg | ORAL_TABLET | Freq: Three times a day (TID) | ORAL | 0 refills | Status: AC | PRN

## 2018-09-23 NOTE — Patient Instructions (Addendum)
Plans:   Flank pain / muscle spasm:  Will get you in to physical therapy  I sent Rx for Tramadol to use as needed for severe pain / help sleep   Trial TENS unit (see below) if desired  OK to continue Tylenol   Can use Voltaren gel (call us if you need a refill on this)   Consider muscle relaxers +/- steroid burst (call if you want me to send something)   Kidney function:  Will refer to nephrology - kidney specialist  Specialized monitoring is required and it's wise to be set up with a specialist in case function worsens abruptly and dialysis is needed.         TENS UNIT: This is helpful for muscle pain and spasm.   Search and Purchase a TENS 7000 2nd edition at  www.tenspros.com or www.[place].com It should be less than $30.     TENS unit instructions: Do not shower or bathe with the unit on . Turn the unit off before removing electrodes or batteries . If the electrodes lose stickiness add a drop of water to the electrodes after they are disconnected from the unit and place on plastic sheet. If you continued to have difficulty, call the TENS unit company to purchase more electrodes. . Do not apply lotion on the skin area prior to use. Make sure the skin is clean and dry as this will help prolong the life of the electrodes. . After use, always check skin for unusual red areas, rash or other skin difficulties. If there are any skin problems, does not apply electrodes to the same area. . Never remove the electrodes from the unit by pulling the wires. . Do not use the TENS unit or electrodes other than as directed. . Do not change electrode placement without consultating your therapist or physician. [name] Keep 2 fingers with between each electrode. . Wear time ratio is 2:1, on to off times.    For example on for 30 minutes off for 15 minutes and then on for 30 minutes off for 15 minutes

## 2018-09-23 NOTE — Progress Notes (Signed)
HPI: Brad James is a 85 y.o. male who  has a past medical history of CAD (coronary artery disease) (01/04/2014), Cancer [facility]), Carotid artery stenosis (01/04/2014), Dizzy spells, Essential hypertension, benign (01/04/2014), Glaucoma, Hyperlipidemia (01/04/2014), Hypertension, and Insomnia (01/04/2014).  he presents to [facility] today, 09/23/18,  for chief complaint of:  Follow up flank pain and kidney function    Seen 5 days ago by Dr Georgina Snell 09/18/18 for flank pain, more concern for MSK pain than renal stone pain, w/u obtained w/ CMP, CBC, UA, Renal US.   Cr to 1.86 (1.66 in 08/2018, 1.76 in 06/2018, 1.9 in 04/2018, 1.4 in 09/2017).   UA trace ketones, 1+ protein, 0-5 hyaline cast    Renal US (+)medical renal disease, non-obstructing L stone, no hydronephrosis or other acute finding.   Few years ago XR Lumbar spine showed multilevel degenerative change.    Today, reports pain is still present on R side above hip, hasn't been to PT yet, moving/sitting up hurts it worse, has been taking Tylenol but this hasn't really helped.     At today's visit... Past medical history, surgical history, and family history reviewed and updated as needed.  Current medication list and allergy/intolerance information reviewed and updated as needed. (See remainder of HPI, ROS, Phys Exam below)   US Renal  Result Date: 09/19/2018 CLINICAL DATA:  Right flank pain EXAM: RENAL / URINARY TRACT ULTRASOUND COMPLETE COMPARISON:  11/08/2017 FINDINGS: Right Kidney: Renal measurements: 10.8 x 5.0 x 5.7 cm = volume: 163 mL. Slight increased cortical echogenicity with prominent medullary pyramids. Difficult to exclude medical renal disease. No hydronephrosis or acute finding. No focal abnormality. Left Kidney: Renal measurements: 9.8 x 5.2 x 4.4 cm = volume: 116 mL. Mild increased echogenicity. Cortical thinning noted. Echogenic shadowing mobile calculus measures 6 mm. No focal  abnormality. Bladder: Underdistended accounting for slight wall prominence. IMPRESSION: Mild increased echogenicity suggesting medical renal disease. Nonobstructing left nephrolithiasis No hydronephrosis or acute finding by ultrasound Electronically Signed   By: Jerilynn Mages.  Shick M.D.   On: 09/19/2018 15:02    No results found for this or any previous visit (from the past 72 hour(s)).        ASSESSMENT/PLAN: The primary encounter diagnosis was Acute right flank pain. Diagnoses of CKD (chronic kidney disease) stage 3, GFR 30-59 ml/min (HCC), Essential hypertension, benign, Low hematocrit, and Secondary renovascular hypertension were also pertinent to this visit.  OMT provided:  FPR to area of spasm, minimal relief MFR to area of spasm, made worse    Orders Placed This Encounter  Procedures  . Ambulatory referral to Nephrology     Meds ordered this encounter  Medications  . amLODipine (NORVASC) 10 MG tablet    Sig: Take 1 tablet (10 mg total) by mouth daily.    Dispense:  90 tablet    Refill:  1  . traMADol (ULTRAM) 50 MG tablet    Sig: Take 1 tablet (50 mg total) by mouth every 8 (eight) hours as needed for up to 5 days for moderate pain or severe pain.    Dispense:  15 tablet    Refill:  0    Patient Instructions  Plans:   Flank pain / muscle spasm:  Will get you in to physical therapy  I sent Rx for Tramadol to use as needed for severe pain / help sleep   Trial TENS unit (see below) if desired  OK to continue Tylenol   Can use Voltaren gel (  call us if you need a refill on this)   Consider muscle relaxers +/- steroid burst (call if you want me to send something)   Kidney function:  Will refer to nephrology - kidney specialist  Specialized monitoring is required and it's wise to be set up with a specialist in case function worsens abruptly and dialysis is needed.         TENS UNIT: This is helpful for muscle pain and spasm.   Search and Purchase a TENS 7000  2nd edition at  www.tenspros.com or www.[email or web address].com It should be less than $30.     TENS unit instructions: Do not shower or bathe with the unit on . Turn the unit off before removing electrodes or batteries . If the electrodes lose stickiness add a drop of water to the electrodes after they are disconnected from the unit and place on plastic sheet. If you continued to have difficulty, call the TENS unit company to purchase more electrodes. . Do not apply lotion on the skin area prior to use. Make sure the skin is clean and dry as this will help prolong the life of the electrodes. . After use, always check skin for unusual red areas, rash or other skin difficulties. If there are any skin problems, does not apply electrodes to the same area. . Never remove the electrodes from the unit by pulling the wires. . Do not use the TENS unit or electrodes other than as directed. . Do not change electrode placement without consultating your therapist or physician. [name] Keep 2 fingers with between each electrode. . Wear time ratio is 2:1, on to off times.    For example on for 30 minutes off for 15 minutes and then on for 30 minutes off for 15 minutes         Follow-up plan: Return in about 3 months (around 12/23/2018) for monitor blood pressure, sooner if needed .                             ############################################ ############################################ ############################################ ############################################    Current Meds  Medication Sig  . allopurinol (ZYLOPRIM) 100 MG tablet Take 1 tablet (100 mg total) by mouth daily.  [name] aspirin EC 81 MG EC tablet Take 1 tablet (81 mg total) by mouth daily.  [name] atorvastatin (LIPITOR) 80 MG tablet Take 1 tablet (80 mg total) by mouth daily.  . brinzolamide (AZOPT) 1 % ophthalmic suspension Place 1 drop into both eyes 2 (two) times daily.  . diclofenac sodium (VOLTAREN)  1 % GEL Apply 2 g topically 4 (four) times daily. To affected joint.  . dorzolamide (TRUSOPT) 2 % ophthalmic solution INSTILL 1 DROP INTO EACH EYE 3 TIMES A DAY  . hydrALAZINE (APRESOLINE) 50 MG tablet TAKE 1 TABLET BY MOUTH TWICE DAILY WITH  LUNCH  AND  DINNER  . indomethacin (INDOCIN) 50 MG capsule Take 1 capsule (50 mg total) by mouth 2 (two) times daily with a meal.  . latanoprost (XALATAN) 0.005 % ophthalmic solution   . metoprolol succinate (TOPROL-XL) 25 MG 24 hr tablet Take 1 tablet (25 mg total) by mouth 2 (two) times daily.  . Multiple Vitamin (MULTIVITAMIN) tablet Take 1 tablet by mouth daily.  [name] PHENobarbital (LUMINAL) 32.4 MG tablet TAKE 2 TABLETS BY MOUTH ONCE DAILY AT BEDTIME  . Probiotic Product (PROBIOTIC PO) Take 1 tablet by mouth daily.  [name] zolpidem (AMBIEN) 10 MG tablet TAKE 1/2 TO 1 (  ONE-HALF TO ONE) TABLET BY MOUTH ONCE DAILY AT BEDTIME AS NEEDED FOR SLEEP    No Known Allergies     Review of Systems:  Constitutional: No recent illness  HEENT: No  headache, no vision change  Cardiac: No  chest pain, No  pressure, No palpitations  Respiratory:  No  shortness of breath. No  Cough  Gastrointestinal: No  abdominal pain, no change on bowel habits  Musculoskeletal: +myalgia/arthralgia as per HPI  Neurologic: No  weakness, No  Dizziness   Exam:  BP (!) 169/71 (BP Location: Left Arm, Patient Position: Sitting, Cuff Size: Normal)   Pulse 60   Temp 98.2 F (36.8 C) (Oral)   Wt 188 lb 3.2 oz (85.4 kg)   BMI 25.52 kg/m   Constitutional: VS see above. General Appearance: alert, well-developed, well-nourished, NAD  Eyes: Normal lids and conjunctive, non-icteric sclera  Ears, Nose, Mouth, Throat: MMM, Normal external inspection ears/nares/mouth/lips/gums.  Neck: No masses, trachea midline.   Respiratory: Normal respiratory effort. no wheeze, no rhonchi, no rales  Cardiovascular: S1/S2 normal, no murmur, no rub/gallop auscultated. RRR.   Musculoskeletal: Gait  normal. Symmetric and independent movement of all extremities. Palpable spasm around anterior edge of quadratus lumborum  Neurological: Normal balance/coordination. No tremor.  Skin: warm, dry, intact.   Psychiatric: Normal judgment/insight. Normal mood and affect. Oriented x3.        Visit summary with medication list and pertinent instructions was printed for patient to review, patient was advised to alert [place] if any updates are needed. All questions at time of visit were answered - patient instructed to contact office with any additional concerns. ER/RTC precautions were reviewed with the patient and understanding verbalized.      Please note: voice recognition software was used to produce this document, and typos may escape review. Please contact Dr. Sheppard Coil for any needed clarifications.    Follow up plan: Return in about 3 months (around 12/23/2018) for monitor blood pressure, sooner if needed .

## 2018-09-24 ENCOUNTER — Encounter: Payer: Self-pay | Admitting: Physical Therapy

## 2018-09-24 ENCOUNTER — Ambulatory Visit (INDEPENDENT_AMBULATORY_CARE_PROVIDER_SITE_OTHER): Payer: PRIVATE HEALTH INSURANCE | Admitting: Physical Therapy

## 2018-09-24 ENCOUNTER — Other Ambulatory Visit: Payer: Self-pay | Admitting: Osteopathic Medicine

## 2018-09-24 ENCOUNTER — Other Ambulatory Visit: Payer: Self-pay

## 2018-09-24 DIAGNOSIS — R293 Abnormal posture: Secondary | ICD-10-CM

## 2018-09-24 DIAGNOSIS — M546 Pain in thoracic spine: Secondary | ICD-10-CM | POA: Diagnosis not present

## 2018-09-24 NOTE — Patient Instructions (Signed)
Access Code: [email or web address]  URL: [email or web address]  Date: 09/24/2018  Prepared by: Faustino Congress   Exercises  Seated Flexion Stretch with Swiss Ball - 3 reps - 1 sets - 30 sec hold - 1x daily - 7x weekly  Seated Thoracic Flexion and Rotation with Swiss Ball - 3 reps - 1 sets - 30 sec hold - 1x daily - 7x weekly  Seated Sidebending Arms Overhead - 3 reps - 1 sets - 30 sec hold - 1x daily - 7x weekly  Patient Education  Trigger Point Dry Needling

## 2018-09-24 NOTE — Therapy (Signed)
[place], [place], [address] Phone: [phone]   Fax:  [phone]  Physical Therapy Evaluation  Patient Details  Name: Brad James MRN: [id number] Date of Birth: [birth year] Referring Provider (PT): Gregor Hams, MD   Encounter Date: 09/24/2018  PT End of Session - 09/24/18 1235    Visit Number  1    Number of Visits  6    Date for PT Re-Evaluation  11/05/18    PT Start Time  0845    PT Stop Time  0925    PT Time Calculation (min)  40 min    Activity Tolerance  Patient tolerated treatment well    Behavior During Therapy  [facility] for tasks assessed/performed       Past Medical History:  Diagnosis Date  . CAD (coronary artery disease) 01/04/2014  . Cancer ([place])    skin  . Carotid artery stenosis 01/04/2014  . Dizzy spells    At times per pt  . Essential hypertension, benign 01/04/2014  . Glaucoma    Bilateral  . Hyperlipidemia 01/04/2014  . Hypertension   . Insomnia 01/04/2014    Past Surgical History:  Procedure Laterality Date  . COLONOSCOPY    . CORONARY ANGIOPLASTY WITH STENT PLACEMENT     stated 3-4 over past 10 yrs.   [name] ENDARTERECTOMY Left 04/18/2016   Procedure: LEFT CAROTID ENDARTERECTOMY;  Surgeon: Serafina Mitchell, MD;  Location: [place];  Service: Vascular;  Laterality: Left;  [name] MELANOMA EXCISION    . PATCH ANGIOPLASTY Left 04/18/2016   Procedure: PATCH ANGIOPLASTY USING Rueben Bash BIOLOGIC PATCH;  Surgeon: Serafina Mitchell, MD;  Location: [place];  Service: Vascular;  Laterality: Left;    There were no vitals filed for this visit.   Subjective Assessment - 09/24/18 0848    Subjective  Pt is an 85 y/o male who presents to OPPT for Rt sided flank pain x 3-4 weeks with progressive worsening of symptoms.  Pt with no known injury.     Limitations  Walking    Diagnostic tests  U/S kidney: negative    Patient Stated Goals  "I'm obeying the doctor." wants to return to golfing    Currently  in Pain?  Yes    Pain Score  3    at best 0/10; up to 8/10   Pain Location  Flank    Pain Orientation  Right    Pain Descriptors / Indicators  Cramping;Sharp    Pain Type  Acute pain    Pain Onset  1 to 4 weeks ago    Pain Frequency  Intermittent    Aggravating Factors   walking (1/2 mile; 10-15 min), pushing up from chair    Pain Relieving Factors  sitting down         [facility] PT Assessment - 09/24/18 0852      Assessment   Medical Diagnosis  M54.6 (ICD-10-CM) - Acute right-sided thoracic back pain    Referring Provider (PT)  Gregor Hams, MD    Onset Date/Surgical Date  --   3-4 weeks   Hand Dominance  Right    Next MD Visit  following completion of PT    Prior Therapy  at this clinic 2-3 years ago      Precautions   Precautions  None      Restrictions   Weight Bearing Restrictions  No      Balance Screen   Has the patient fallen in  the past 6 months  No    Has the patient had a decrease in activity level because of a fear of falling?   No    Is the patient reluctant to leave their home because of a fear of falling?   No      Home Environment   Living Environment  Private residence    Living Arrangements  Alone    Type of [place] to enter    Entrance Stairs-Number of Steps  2    Entrance Stairs-Rails  None    Home Layout  One level    Additional Comments  no difficulty with stairs or ADLs or sleeping      Prior Function   Level of Independence  Independent    Vocation  Retired    [employer]  retired Tax adviser from [employer]    Leisure  golfing, "I'm pretty lazy" Exercise: walks dog 1-2x/day      Cognition   Overall Cognitive Status  Within Functional Limits for tasks assessed      Observation/Other Assessments   Focus on Therapeutic Outcomes (FOTO)   60 (40% limited; predicted 31% limited)      Posture/Postural Control   Posture/Postural Control  Postural limitations    Postural Limitations   Rounded Shoulders;Forward head;Increased thoracic kyphosis      ROM / Strength   AROM / PROM / Strength  AROM;Strength      AROM   AROM Assessment Site  Thoracic    Thoracic Flexion  unable; pt refused    Thoracic Extension  WNL    Thoracic - Right Side Bend  WNL with pain    Thoracic - Left Side Bend  WNL    Thoracic - Right Rotation  WNL    Thoracic - Left Rotation  WNL      Strength   Overall Strength Comments  grossly WFL      Palpation   Palpation comment  significant tenderness and trigger points along Rt posterior flank in lower lats                Objective measurements completed on examination: See above findings.      [place] Adult PT Treatment/Exercise - 09/24/18 0852      Exercises   Exercises  Lumbar      Lumbar Exercises: Stretches   Quadruped Mid Back Stretch Limitations  seated mid back stretch to mid/Lt x30 sec each direction    Other Lumbar Stretch Exercise  seated overhead reach to Lt for Rt side stretch x 30 sec      Manual Therapy   Manual Therapy  Soft tissue mobilization    Soft tissue mobilization  instructed in use of ball for STM             PT Education - 09/24/18 1235    Education Details  HEP, self massage with ball    Person(s) Educated  Patient    Methods  Explanation    Comprehension  Verbalized understanding          PT Long Term Goals - 09/24/18 1252      PT LONG TERM GOAL #1   Title  independent with HEP    Status  New    Target Date  11/05/18      PT LONG TERM GOAL #2   Title  FOTO score improved to </= 31% limitation for improved functional mobility  Status  New    Target Date  11/05/18      PT LONG TERM GOAL #3   Title  report ability to play golf with pain < 4/10 for improved function    Status  New    Target Date  11/05/18      PT LONG TERM GOAL #4   Title  demonstrate proper lifting techniques without increase in pain for improved functional mobility    Status  New    Target Date  11/05/18       PT LONG TERM GOAL #5   Title  n/a             Plan - 09/24/18 0925    Clinical Impression Statement  Pt is an 85 y/o male who presents to OPPT for Rt sided flank pain.  Pt demonstrates active trigger points and muscle tightness which is affecting his ability to perform IADLs as well as recreational activities.  Pt will benefit from PT to address deficits listed.    History and Personal Factors relevant to plan of care:  HTN, glaucoma, skin cancer, CAD    Clinical Presentation  Stable    Clinical Decision Making  Low    Rehab Potential  Good    PT Frequency  1x / week    PT Duration  6 weeks    PT Treatment/Interventions  ADLs/Self Care Home Management;Cryotherapy;Electrical Stimulation;Moist Heat;Ultrasound;Functional mobility training;Therapeutic activities;Therapeutic exercise;Manual techniques;Patient/family education;Dry needling;Taping    PT Next Visit Plan  review stretches, DN/manual/modalities PRN, gentle core strength, posture/body mechanics education    PT Home Exercise Plan  Access Code: [email or web address]    Consulted and Agree with Plan of Care  Patient       Patient will benefit from skilled therapeutic intervention in order to improve the following deficits and impairments:  Increased fascial restricitons, Pain, Increased muscle spasms, Postural dysfunction, Impaired flexibility  Visit Diagnosis: Pain in thoracic spine - Plan: PT plan of care cert/re-cert  Abnormal posture - Plan: PT plan of care cert/re-cert     Problem List Patient Active Problem List   Diagnosis Date Noted  . CKD (chronic kidney disease) stage 3, GFR 30-59 ml/min ([place]) 07/02/2018  . Gout tophi 07/02/2018  . Right flank pain 11/08/2017  . Primary osteoarthritis of both ankles 05/21/2017  . Abnormal skin growth 08/07/2016  . History of seizure 07/09/2016  . Low back pain 07/02/2016  . Primary open angle glaucoma 03/19/2016  . Carotid stenosis 03/12/2016  . Pulmonary nodules 06/08/2015  .  Gout 04/18/2015  . Skin cancer 07/22/2014  . Nodular basal cell carcinoma 06/10/2014  . Squamous cell carcinoma in situ 06/10/2014  . Renal artery stenosis ([place]) 02/22/2014  . Chronic pancreatitis ([place]) 02/22/2014  . 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 Primary open angle glaucoma of both eyes 02/22/2014  . History of basal cell carcinoma 02/22/2014  . CAD (coronary artery disease) 01/04/2014  . Secondary renovascular hypertension 01/04/2014  . Hyperlipidemia 01/04/2014  . Insomnia 01/04/2014  . Carotid artery stenosis 01/04/2014  . Epilepsy ([place]) 01/04/2014      Laureen Abrahams, PT, DPT 09/24/18 12:56 PM     [facility] [place], [place], [address] Phone: [phone]   Fax:  [phone]  Name: Khalil Szczepanik MRN: [id number] Date of Birth: 11/13/1926

## 2018-09-25 NOTE — Telephone Encounter (Signed)
Brad James requesting med refill for zolpidem.

## 2018-09-25 NOTE — Telephone Encounter (Signed)
Pt has been updated of med refill sent to local pharmacy. No other inquiries during call.

## 2018-10-01 ENCOUNTER — Encounter: Payer: Self-pay | Admitting: Physical Therapy

## 2018-10-01 ENCOUNTER — Ambulatory Visit: Payer: PRIVATE HEALTH INSURANCE | Admitting: Physical Therapy

## 2018-10-01 DIAGNOSIS — M546 Pain in thoracic spine: Secondary | ICD-10-CM

## 2018-10-01 DIAGNOSIS — R293 Abnormal posture: Secondary | ICD-10-CM | POA: Diagnosis not present

## 2018-10-01 NOTE — Therapy (Signed)
[place], [place], [address] Phone: [phone]   Fax:  [phone]  Physical Therapy Treatment  Patient Details  Name: Brad James MRN: [id number] Date of Birth: [birth year] Referring Provider (PT): Gregor Hams, MD   Encounter Date: 10/01/2018  PT End of Session - 10/01/18 1107    Visit Number  2    Number of Visits  6    Date for PT Re-Evaluation  11/05/18    PT Start Time  1024    PT Stop Time  1104    PT Time Calculation (min)  40 min    Activity Tolerance  Patient tolerated treatment well    Behavior During Therapy  [facility] for tasks assessed/performed       Past Medical History:  Diagnosis Date  . CAD (coronary artery disease) 01/04/2014  . Cancer ([place])    skin  . Carotid artery stenosis 01/04/2014  . Dizzy spells    At times per pt  . Essential hypertension, benign 01/04/2014  . Glaucoma    Bilateral  . Hyperlipidemia 01/04/2014  . Hypertension   . Insomnia 01/04/2014    Past Surgical History:  Procedure Laterality Date  . COLONOSCOPY    . CORONARY ANGIOPLASTY WITH STENT PLACEMENT     stated 3-4 over past 10 yrs.   [name] ENDARTERECTOMY Left 04/18/2016   Procedure: LEFT CAROTID ENDARTERECTOMY;  Surgeon: Serafina Mitchell, MD;  Location: [place];  Service: Vascular;  Laterality: Left;  [name] MELANOMA EXCISION    . PATCH ANGIOPLASTY Left 04/18/2016   Procedure: PATCH ANGIOPLASTY USING Rueben Bash BIOLOGIC PATCH;  Surgeon: Serafina Mitchell, MD;  Location: [place];  Service: Vascular;  Laterality: Left;    There were no vitals filed for this visit.  Subjective Assessment - 10/01/18 1026    Subjective  had pain this morning up to 7/10.  took an ibuprofen and pain is now resolved.  has only done exercises 2-3 day over the past week.  doesn't want to try DN as he's concerned that "this may be from my kidney, and I don't think you should be sticking a needle in me."    Patient Stated Goals  "I'm obeying the  doctor." wants to return to golfing    Currently in Pain?  No/denies                       [facility] Adult PT Treatment/Exercise - 10/01/18 1027      Lumbar Exercises: Stretches   Quadruped Mid Back Stretch Limitations  seated mid back stretch to mid/Lt 2x30 sec each direction    Other Lumbar Stretch Exercise  seated overhead reach to Lt for Rt side stretch 2 x 30 sec      Lumbar Exercises: Aerobic   Nustep  L 3 x 6 min      Modalities   Modalities  Ultrasound      Ultrasound   Ultrasound Location  Rt flank    Ultrasound Parameters  100% DC, 1.0 w/cm2, 1 mHz freq x 8 min    Ultrasound Goals  Pain;Other (Comment)   tissue extensibility     Manual Therapy   Manual Therapy  Soft tissue mobilization    Soft tissue mobilization  IASTM to lower lats, Rt flank into QL                  PT Long Term Goals - 09/24/18 1252  PT LONG TERM GOAL #1   Title  independent with HEP    Status  New    Target Date  11/05/18      PT LONG TERM GOAL #2   Title  FOTO score improved to </= 31% limitation for improved functional mobility    Status  New    Target Date  11/05/18      PT LONG TERM GOAL #3   Title  report ability to play golf with pain < 4/10 for improved function    Status  New    Target Date  11/05/18      PT LONG TERM GOAL #4   Title  demonstrate proper lifting techniques without increase in pain for improved functional mobility    Status  New    Target Date  11/05/18      PT LONG TERM GOAL #5   Title  n/a            Plan - 10/01/18 1108    Clinical Impression Statement  Pt tolerated session well today, and reports min compliance with HEP.  Stressed importance of regular stretching to maximize benefit, and trialed U/S with manual therapy today to see if this will help symptoms.  Feel symptoms most consistent with musculoskeletal injury, but pt unsure at this time and declined DN for now.  Will continue to benefit from PT to maximize  function.    Rehab Potential  Good    PT Frequency  1x / week    PT Duration  6 weeks    PT Treatment/Interventions  ADLs/Self Care Home Management;Cryotherapy;Electrical Stimulation;Moist Heat;Ultrasound;Functional mobility training;Therapeutic activities;Therapeutic exercise;Manual techniques;Patient/family education;Dry needling;Taping    PT Next Visit Plan  review stretches, DN/manual/modalities PRN, gentle core strength, posture/body mechanics education; assess response to [place] and manual    PT Home Exercise Plan  Access Code: [email or web address]    Consulted and Agree with Plan of Care  Patient       Patient will benefit from skilled therapeutic intervention in order to improve the following deficits and impairments:  Increased fascial restricitons, Pain, Increased muscle spasms, Postural dysfunction, Impaired flexibility  Visit Diagnosis: Pain in thoracic spine  Abnormal posture     Problem List Patient Active Problem List   Diagnosis Date Noted  . CKD (chronic kidney disease) stage 3, GFR 30-59 ml/min ([place]) 07/02/2018  . Gout tophi 07/02/2018  . Right flank pain 11/08/2017  . Primary osteoarthritis of both ankles 05/21/2017  . Abnormal skin growth 08/07/2016  . History of seizure 07/09/2016  . Low back pain 07/02/2016  . Primary open angle glaucoma 03/19/2016  . Carotid stenosis 03/12/2016  . Pulmonary nodules 06/08/2015  . Gout 04/18/2015  . Skin cancer 07/22/2014  . Nodular basal cell carcinoma 06/10/2014  . Squamous cell carcinoma in situ 06/10/2014  . Renal artery stenosis ([place]) 02/22/2014  . Chronic pancreatitis ([place]) 02/22/2014  . 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 Primary open angle glaucoma of both eyes 02/22/2014  . History of basal cell carcinoma 02/22/2014  . CAD (coronary artery disease) 01/04/2014  . Secondary renovascular hypertension 01/04/2014  . Hyperlipidemia 01/04/2014  . Insomnia 01/04/2014  . Carotid artery stenosis 01/04/2014  . Epilepsy ([place]) 01/04/2014       Laureen Abrahams, PT, DPT 10/01/18 11:10 AM    [facility] [place], [place], [address] Phone: [phone]   Fax:  [phone]  Name: Brad James MRN: [id number] Date of Birth: [birth year]

## 2018-10-08 ENCOUNTER — Encounter: Payer: Self-pay | Admitting: Physical Therapy

## 2018-10-08 ENCOUNTER — Ambulatory Visit: Payer: PRIVATE HEALTH INSURANCE | Admitting: Physical Therapy

## 2018-10-08 DIAGNOSIS — R293 Abnormal posture: Secondary | ICD-10-CM

## 2018-10-08 DIAGNOSIS — M546 Pain in thoracic spine: Secondary | ICD-10-CM

## 2018-10-08 NOTE — Therapy (Signed)
[place], [place], [address] Phone: [phone]   Fax:  [phone]  Physical Therapy Treatment  Patient Details  Name: Brad James MRN: [id number] Date of Birth: 02-06-1927 Referring Provider (PT): Gregor Hams, MD   Encounter Date: 10/08/2018  PT End of Session - 10/08/18 1112    Visit Number  3    Number of Visits  6    Date for PT Re-Evaluation  11/05/18    PT Start Time  1030    PT Stop Time  1112    PT Time Calculation (min)  42 min    Activity Tolerance  Patient tolerated treatment well    Behavior During Therapy  [facility] for tasks assessed/performed       Past Medical History:  Diagnosis Date  . CAD (coronary artery disease) 01/04/2014  . Cancer ([place])    skin  . Carotid artery stenosis 01/04/2014  . Dizzy spells    At times per pt  . Essential hypertension, benign 01/04/2014  . Glaucoma    Bilateral  . Hyperlipidemia 01/04/2014  . Hypertension   . Insomnia 01/04/2014    Past Surgical History:  Procedure Laterality Date  . COLONOSCOPY    . CORONARY ANGIOPLASTY WITH STENT PLACEMENT     stated 3-4 over past 10 yrs.   [name] ENDARTERECTOMY Left 04/18/2016   Procedure: LEFT CAROTID ENDARTERECTOMY;  Surgeon: Serafina Mitchell, MD;  Location: [place];  Service: Vascular;  Laterality: Left;  [name] MELANOMA EXCISION    . PATCH ANGIOPLASTY Left 04/18/2016   Procedure: PATCH ANGIOPLASTY USING Rueben Bash BIOLOGIC PATCH;  Surgeon: Serafina Mitchell, MD;  Location: [place];  Service: Vascular;  Laterality: Left;    There were no vitals filed for this visit.  Subjective Assessment - 10/08/18 1032    Subjective  no changes after last session.  "last night was the worst it's ever been." not sure of anything that provoked symptoms; did report not taking an ibuprofen yesterday.    Patient Stated Goals  "I'm obeying the doctor." wants to return to golfing    Currently in Pain?  No/denies                        [facility] Adult PT Treatment/Exercise - 10/08/18 1033      Lumbar Exercises: Stretches   Passive Hamstring Stretch  Right;Left;3 reps;30 seconds    Passive Hamstring Stretch Limitations  seated    Quadruped Mid Back Stretch Limitations  seated mid back stretch to mid/Lt 2x30 sec each direction    Other Lumbar Stretch Exercise  seated overhead reach to Lt for Rt side stretch 2 x 30 sec      Lumbar Exercises: Aerobic   Nustep  L5 x 6 min      Lumbar Exercises: Standing   Row  Both;10 reps;Theraband    Theraband Level (Row)  Level 2 (Red)    Row Limitations  5 sec hold; cues for technique      Ultrasound   Ultrasound Location  Rt flank    Ultrasound Parameters  100% DC, 1.0 w/cm2, 1 mHz freq x 8 min    Ultrasound Goals  Pain;Other (Comment)   tissue extensibility     Manual Therapy   Manual Therapy  Soft tissue mobilization    Soft tissue mobilization  IASTM to lower lats, Rt flank into QL  PT Long Term Goals - 09/24/18 1252      PT LONG TERM GOAL #1   Title  independent with HEP    Status  New    Target Date  11/05/18      PT LONG TERM GOAL #2   Title  FOTO score improved to </= 31% limitation for improved functional mobility    Status  New    Target Date  11/05/18      PT LONG TERM GOAL #3   Title  report ability to play golf with pain < 4/10 for improved function    Status  New    Target Date  11/05/18      PT LONG TERM GOAL #4   Title  demonstrate proper lifting techniques without increase in pain for improved functional mobility    Status  New    Target Date  11/05/18      PT LONG TERM GOAL #5   Title  n/a            Plan - 10/08/18 1112    Clinical Impression Statement  Pt without pain throughout session and overall with minimal pain at this time which is managed by ibuprofen.  Pt declining dry needling at this time.  Will continue to benefit from PT to maximize function.    Rehab Potential   Good    PT Frequency  1x / week    PT Duration  6 weeks    PT Treatment/Interventions  ADLs/Self Care Home Management;Cryotherapy;Electrical Stimulation;Moist Heat;Ultrasound;Functional mobility training;Therapeutic activities;Therapeutic exercise;Manual techniques;Patient/family education;Dry needling;Taping    PT Next Visit Plan  DN/manual/modalities PRN, gentle core strength, posture/body mechanics education    PT Home Exercise Plan  Access Code: [email or web address]    Consulted and Agree with Plan of Care  Patient       Patient will benefit from skilled therapeutic intervention in order to improve the following deficits and impairments:  Increased fascial restricitons, Pain, Increased muscle spasms, Postural dysfunction, Impaired flexibility  Visit Diagnosis: Pain in thoracic spine  Abnormal posture     Problem List Patient Active Problem List   Diagnosis Date Noted  . CKD (chronic kidney disease) stage 3, GFR 30-59 ml/min ([place]) 07/02/2018  . Gout tophi 07/02/2018  . Right flank pain 11/08/2017  . Primary osteoarthritis of both ankles 05/21/2017  . Abnormal skin growth 08/07/2016  . History of seizure 07/09/2016  . Low back pain 07/02/2016  . Primary open angle glaucoma 03/19/2016  . Carotid stenosis 03/12/2016  . Pulmonary nodules 06/08/2015  . Gout 04/18/2015  . Skin cancer 07/22/2014  . Nodular basal cell carcinoma 06/10/2014  . Squamous cell carcinoma in situ 06/10/2014  . Renal artery stenosis ([place]) 02/22/2014  . Chronic pancreatitis ([place]) 02/22/2014  . SMA stenosis ([place]) 02/22/2014  . Primary open angle glaucoma of both eyes 02/22/2014  . History of basal cell carcinoma 02/22/2014  . CAD (coronary artery disease) 01/04/2014  . Secondary renovascular hypertension 01/04/2014  . Hyperlipidemia 01/04/2014  . Insomnia 01/04/2014  . Carotid artery stenosis 01/04/2014  . Epilepsy ([place]) 01/04/2014      Laureen Abrahams, PT, DPT 10/08/18 11:15 AM     [facility] [place], [place], [address] Phone: [phone]   Fax:  [phone]  Name: Brad James MRN: [id number] Date of Birth: 1926-12-12

## 2018-10-15 ENCOUNTER — Encounter: Payer: Self-pay | Admitting: Physical Therapy

## 2018-10-15 ENCOUNTER — Ambulatory Visit: Payer: PRIVATE HEALTH INSURANCE | Admitting: Physical Therapy

## 2018-10-15 DIAGNOSIS — R293 Abnormal posture: Secondary | ICD-10-CM | POA: Diagnosis not present

## 2018-10-15 DIAGNOSIS — M546 Pain in thoracic spine: Secondary | ICD-10-CM | POA: Diagnosis not present

## 2018-10-15 NOTE — Therapy (Addendum)
[facility] [place], [place], [address] Phone: [phone]   Fax:  [phone]  Physical Therapy Treatment/Discharge  Patient Details  Name: Brad James MRN: [id number] Date of Birth: [birth year] Referring Provider (PT): Gregor Hams, MD   Encounter Date: 10/15/2018  PT End of Session - 10/15/18 1205    Visit Number  4    Number of Visits  6    Date for PT Re-Evaluation  11/05/18    PT Start Time  1118    PT Stop Time  1158    PT Time Calculation (min)  40 min    Activity Tolerance  Patient tolerated treatment well    Behavior During Therapy  [facility] for tasks assessed/performed       Past Medical History:  Diagnosis Date  . CAD (coronary artery disease) 01/04/2014  . Cancer ([place])    skin  . Carotid artery stenosis 01/04/2014  . Dizzy spells    At times per pt  . Essential hypertension, benign 01/04/2014  . Glaucoma    Bilateral  . Hyperlipidemia 01/04/2014  . Hypertension   . Insomnia 01/04/2014    Past Surgical History:  Procedure Laterality Date  . COLONOSCOPY    . CORONARY ANGIOPLASTY WITH STENT PLACEMENT     stated 3-4 over past 10 yrs.   [name] ENDARTERECTOMY Left 04/18/2016   Procedure: LEFT CAROTID ENDARTERECTOMY;  Surgeon: Serafina Mitchell, MD;  Location: [facility];  Service: Vascular;  Laterality: Left;  [name] MELANOMA EXCISION    . PATCH ANGIOPLASTY Left 04/18/2016   Procedure: PATCH ANGIOPLASTY USING Rueben Bash BIOLOGIC PATCH;  Surgeon: Serafina Mitchell, MD;  Location: [facility];  Service: Vascular;  Laterality: Left;    There were no vitals filed for this visit.  Subjective Assessment - 10/15/18 1121    Subjective  "The last few days have been bad, but today is okay."  pain continues to fluctuate.  thinks sitting may aggravate symptoms    Patient Stated Goals  "I'm obeying the doctor." wants to return to golfing    Currently in Pain?  No/denies         [facility] PT Assessment - 10/15/18 1137      Assessment   Medical Diagnosis  M54.6 (ICD-10-CM) - Acute right-sided thoracic back pain    Referring Provider (PT)  Gregor Hams, MD      Observation/Other Assessments   Focus on Therapeutic Outcomes (FOTO)   65 (35% limited)                   [facility] Adult PT Treatment/Exercise - 10/15/18 1122      Therapeutic Activites    Therapeutic Activities  Lifting    Lifting  practiced lifting techniques from floor to waist height with min cues needed.        Lumbar Exercises: Stretches   Quadruped Mid Back Stretch Limitations  seated mid back stretch to mid/Lt 3x30 sec each direction      Lumbar Exercises: Aerobic   Nustep  L5 x 6 min      Lumbar Exercises: Standing   Row  Both;10 reps;Theraband    Theraband Level (Row)  Level 3 (Green)    Row Limitations  5 sec hold; cues for technique      Lumbar Exercises: Supine   Pelvic Tilt  10 reps;5 seconds    Bridge  10 reps;5 seconds             PT Education -  10/15/18 1205    Education Details  lifting techniques; current progress and POC    Person(s) Educated  Patient    Methods  Explanation    Comprehension  Verbalized understanding          PT Long Term Goals - 10/15/18 1205      PT LONG TERM GOAL #1   Title  independent with HEP    Status  Achieved      PT LONG TERM GOAL #2   Title  FOTO score improved to </= 31% limitation for improved functional mobility    Baseline  1/29: improved 5% of predicted 9%    Status  On-going      PT LONG TERM GOAL #3   Title  report ability to play golf with pain < 4/10 for improved function    Baseline  1/29: pt reports unable to play due to weather, but feels he wouldn't have difficulty and plans to play when weather permits    Status  Unable to assess      PT LONG TERM GOAL #4   Title  demonstrate proper lifting techniques without increase in pain for improved functional mobility    Status  Achieved      PT LONG TERM GOAL #5   Title  n/a            Plan  - 10/15/18 1207    Clinical Impression Statement  At this time pt has met 2 LTGs with improvement in FOTO score (not to goal) and unable to assess other LTG due to weather.  Pt at this time is requesting to hold PT and plans to follow up with nephrologist as he really feels kidney is contributing to pain.  Will hold until end of Feb and pt will call to return if needed.      Rehab Potential  Good    PT Frequency  1x / week    PT Duration  6 weeks    PT Treatment/Interventions  ADLs/Self Care Home Management;Cryotherapy;Electrical Stimulation;Moist Heat;Ultrasound;Functional mobility training;Therapeutic activities;Therapeutic exercise;Manual techniques;Patient/family education;Dry needling;Taping    PT Next Visit Plan  hold PT x 30 days, reassess if he returns otherwise d/c.    PT Home Exercise Plan  Access Code: [email or web address]    Consulted and Agree with Plan of Care  Patient       Patient will benefit from skilled therapeutic intervention in order to improve the following deficits and impairments:  Increased fascial restricitons, Pain, Increased muscle spasms, Postural dysfunction, Impaired flexibility  Visit Diagnosis: Pain in thoracic spine  Abnormal posture     Problem List Patient Active Problem List   Diagnosis Date Noted  . CKD (chronic kidney disease) stage 3, GFR 30-59 ml/min ([place]) 07/02/2018  . Gout tophi 07/02/2018  . Right flank pain 11/08/2017  . Primary osteoarthritis of both ankles 05/21/2017  . Abnormal skin growth 08/07/2016  . History of seizure 07/09/2016  . Low back pain 07/02/2016  . Primary open angle glaucoma 03/19/2016  . Carotid stenosis 03/12/2016  . Pulmonary nodules 06/08/2015  . Gout 04/18/2015  . Skin cancer 07/22/2014  . Nodular basal cell carcinoma 06/10/2014  . Squamous cell carcinoma in situ 06/10/2014  . Renal artery stenosis ([place]) 02/22/2014  . Chronic pancreatitis ([place]) 02/22/2014  . SMA stenosis ([place]) 02/22/2014  . Primary open angle glaucoma  of both eyes 02/22/2014  . History of basal cell carcinoma 02/22/2014  . CAD (coronary artery disease) 01/04/2014  . Secondary renovascular hypertension 01/04/2014  .  Hyperlipidemia 01/04/2014  . Insomnia 01/04/2014  . Carotid artery stenosis 01/04/2014  . Epilepsy ([place]) 01/04/2014       Laureen Abrahams, PT, DPT 10/15/18 12:11 PM     [facility] [place], [place], [address] Phone: [phone]   Fax:  [phone]  Name: Brad James MRN: [id number] Date of Birth: 05/24/1927    PHYSICAL THERAPY DISCHARGE SUMMARY  Visits from Start of Care: 4  Current functional level related to goals / functional outcomes: See above   Remaining deficits: See above   Education / Equipment: HEP  Plan: Patient agrees to discharge.  Patient goals were not met. Patient is being discharged due to the patient's request.  ?????    Laureen Abrahams, PT, DPT 11/18/18 1:17 PM  [facility] [place], [place] [address]  [phone] (office) [phone] (fax)

## 2018-10-29 ENCOUNTER — Other Ambulatory Visit: Payer: Self-pay | Admitting: Osteopathic Medicine

## 2018-10-29 NOTE — Telephone Encounter (Signed)
Pt has updated of med refill sent to local pharmacy. No other inquiries during call.

## 2018-10-29 NOTE — Telephone Encounter (Signed)
Brad James requesting med refill phenobarbital.

## 2018-11-27 ENCOUNTER — Other Ambulatory Visit: Payer: Self-pay | Admitting: Osteopathic Medicine

## 2018-11-28 ENCOUNTER — Other Ambulatory Visit: Payer: Self-pay | Admitting: Osteopathic Medicine

## 2018-11-28 ENCOUNTER — Other Ambulatory Visit: Payer: Self-pay

## 2018-11-28 MED ORDER — PHENOBARBITAL 32.4 MG PO TABS: ORAL_TABLET | ORAL | 0 refills | Status: DC

## 2018-11-28 MED ORDER — PHENOBARBITAL 32.4 MG PO TABS: 32.4000 mg | ORAL_TABLET | Freq: Two times a day (BID) | ORAL | 0 refills | Status: DC

## 2018-12-02 ENCOUNTER — Other Ambulatory Visit: Payer: Self-pay

## 2018-12-02 ENCOUNTER — Ambulatory Visit (INDEPENDENT_AMBULATORY_CARE_PROVIDER_SITE_OTHER): Payer: PRIVATE HEALTH INSURANCE | Admitting: Osteopathic Medicine

## 2018-12-02 ENCOUNTER — Encounter: Payer: Self-pay | Admitting: Osteopathic Medicine

## 2018-12-02 VITALS — BP 151/75 | HR 56 | Temp 97.6°F | Wt 191.6 lb

## 2018-12-02 DIAGNOSIS — N183 Chronic kidney disease, stage 3 unspecified: Secondary | ICD-10-CM

## 2018-12-02 DIAGNOSIS — G40909 Epilepsy, unspecified, not intractable, without status epilepticus: Secondary | ICD-10-CM

## 2018-12-02 LAB — BASIC METABOLIC PANEL WITH GFR
BUN/Creatinine Ratio: 14 (calc) (ref 6–22)
BUN: 23 mg/dL (ref 7–25)
CO2: 26 mmol/L (ref 20–32)
Calcium: 9.3 mg/dL (ref 8.6–10.3)
Chloride: 106 mmol/L (ref 98–110)
Creat: 1.59 mg/dL — ABNORMAL HIGH (ref 0.70–1.11)
GFR, Est African American: 44 mL/min/{1.73_m2} — ABNORMAL LOW (ref >=60)
GFR, Est Non African American: 38 mL/min/{1.73_m2} — ABNORMAL LOW (ref >=60)
Glucose, Bld: 94 mg/dL (ref 65–99)
Potassium: 4.5 mmol/L (ref 3.5–5.3)
Sodium: 141 mmol/L (ref 135–146)

## 2018-12-02 NOTE — Progress Notes (Signed)
HPI: Brad James is a 85 y.o. male who  has a past medical history of CAD (coronary artery disease) (01/04/2014), Cancer [facility]), Carotid artery stenosis (01/04/2014), Dizzy spells, Essential hypertension, benign (01/04/2014), Glaucoma, Hyperlipidemia (01/04/2014), Hypertension, and Insomnia (01/04/2014).  he presents to [facility] today, 12/03/18,  for chief complaint of:  Medication concern: was told needed visit for refills phenobarbital.     At today's visit 12/03/18 ... PMH, PSH, FH reviewed and updated as needed.  Current medication list and allergy/intolerance hx reviewed and updated as needed. (See remainder of HPI, ROS, Phys Exam below)    Results for orders placed or performed in visit on 12/02/18 (from the past 72 hour(s))  BASIC METABOLIC PANEL WITH GFR     Status: Abnormal   Collection Time: 12/02/18 11:30 AM  Result Value Ref Range   Glucose, Bld 94 65 - 99 mg/dL    Comment: .            Fasting reference interval .    BUN 23 7 - 25 mg/dL   Creat 1.59 (H) 0.70 - 1.11 mg/dL    Comment: For patients >89 years of age, the reference limit for Creatinine is approximately 13% higher for people identified as African-American. .    GFR, Est Non African American 38 (L) > OR = 60 mL/min/1.2m2   GFR, Est African American 44 (L) > OR = 60 mL/min/1.20m2   BUN/Creatinine Ratio 14 6 - 22 (calc)   Sodium 141 135 - 146 mmol/L   Potassium 4.5 3.5 - 5.3 mmol/L   Chloride 106 98 - 110 mmol/L   CO2 26 20 - 32 mmol/L   Calcium 9.3 8.6 - 10.3 mg/dL          ASSESSMENT/PLAN: The primary encounter diagnosis was Nonintractable epilepsy without status epilepticus, unspecified epilepsy type ([place]). A diagnosis of Stage 3 chronic kidney disease (HCC) was also pertinent to this visit.   Orders Placed This Encounter  Procedures  . Phenobarbital level  . BASIC METABOLIC PANEL WITH GFR        Follow-up plan: Return in about 6 months (around  06/04/2019) for follow-up BP and routine medical care - see me sooner if needed. .                                                 ################################################# ################################################# ################################################# #################################################    Current Meds  Medication Sig  . allopurinol (ZYLOPRIM) 100 MG tablet Take 1 tablet (100 mg total) by mouth daily.  [name] amLODipine (NORVASC) 10 MG tablet Take 1 tablet (10 mg total) by mouth daily.  [name] aspirin EC 81 MG EC tablet Take 1 tablet (81 mg total) by mouth daily.  [name] atorvastatin (LIPITOR) 80 MG tablet Take 1 tablet (80 mg total) by mouth daily.  . brinzolamide (AZOPT) 1 % ophthalmic suspension Place 1 drop into both eyes 2 (two) times daily.  . diclofenac sodium (VOLTAREN) 1 % GEL Apply 2 g topically 4 (four) times daily. To affected joint.  . dorzolamide (TRUSOPT) 2 % ophthalmic solution INSTILL 1 DROP INTO EACH EYE 3 TIMES A DAY  . hydrALAZINE (APRESOLINE) 50 MG tablet TAKE 1 TABLET BY MOUTH TWICE DAILY WITH  LUNCH  AND  DINNER  . indomethacin (INDOCIN) 50 MG capsule Take 1 capsule (50 mg total) by mouth  2 (two) times daily with a meal.  . latanoprost (XALATAN) 0.005 % ophthalmic solution   . metoprolol succinate (TOPROL-XL) 25 MG 24 hr tablet Take 1 tablet (25 mg total) by mouth 2 (two) times daily.  . Multiple Vitamin (MULTIVITAMIN) tablet Take 1 tablet by mouth daily.  [name] PHENobarbital (LUMINAL) 32.4 MG tablet Take 1 tablet (32.4 mg total) by mouth 2 (two) times daily.  . Probiotic Product (PROBIOTIC PO) Take 1 tablet by mouth daily.  [name] zolpidem (AMBIEN) 10 MG tablet TAKE 1/2 TO 1 (ONE-HALF TO ONE) TABLET BY MOUTH AT BEDTIME AS NEEDED FOR SLEEP    No Known Allergies     Review of Systems:  Constitutional: No recent illness, feeling well   HEENT: No  headache, no vision change  Cardiac: No  chest  pain  Respiratory:  No  shortness of breath. No  Cough  Neurologic: No  weakness, No  Dizziness  Psychiatric: No  concerns with depression, No  concerns with anxiety  Exam:  BP (!) 151/75 (BP Location: Left Arm, Patient Position: Sitting, Cuff Size: Normal)   Pulse (!) 56   Temp 97.6 F (36.4 C) (Oral)   Wt 191 lb 9.6 oz (86.9 kg)   BMI 25.99 kg/m   Constitutional: VS see above. General Appearance: alert, well-developed, well-nourished, NAD  Eyes: Normal lids and conjunctive, non-icteric sclera  Ears, Nose, Mouth, Throat: MMM, Normal external inspection ears/nares/mouth/lips/gums.  Neck: No masses, trachea midline.   Respiratory: Normal respiratory effort.  Musculoskeletal: Gait normal.   Neurological: Normal balance/coordination. No tremor.  Skin: warm, dry, intact.   Psychiatric: Normal judgment/insight. Normal mood and affect. Oriented x3.       Visit summary with medication list and pertinent instructions was printed for patient to review, patient was advised to alert [place] if any updates are needed. All questions at time of visit were answered - patient instructed to contact office with any additional concerns. ER/RTC precautions were reviewed with the patient and understanding verbalized.     Please note: voice recognition software was used to produce this document, and typos may escape review. Please contact Dr. Sheppard Coil for any needed clarifications.    Follow up plan: Return in about 6 months (around 06/04/2019) for follow-up BP and routine medical care - see me sooner if needed. . Will be moving to [place] for a couple months, not sure how long. Advised to consider establish w/ Dr there just in case

## 2018-12-04 LAB — PHENOBARBITAL LEVEL: Phenobarbital, Serum: 9.4 mg/L — ABNORMAL LOW (ref 15.0–40.0)

## 2018-12-23 ENCOUNTER — Telehealth: Payer: Self-pay

## 2018-12-23 NOTE — Telephone Encounter (Signed)
Daughter advised to take him to the ED.

## 2018-12-23 NOTE — Telephone Encounter (Signed)
Pt's daughter called stating that pt is having sudden onset of confusion. No other symptoms noted such as fever, loss of appetite. Requesting recommendation from provider. Pls advise, thanks.

## 2018-12-23 NOTE — Telephone Encounter (Signed)
For new mental status changes, would recommend ER

## 2018-12-26 ENCOUNTER — Other Ambulatory Visit: Payer: Self-pay | Admitting: Osteopathic Medicine

## 2018-12-29 NOTE — Telephone Encounter (Signed)
Pt has been updated regarding med refill sent to the pharmacy. No other inquiries during call.

## 2018-12-29 NOTE — Telephone Encounter (Signed)
[facility] requesting med refill for phenobarbital. Last phenobarbital check was stable on  12/02/18. Pls advise if refill is appropriate. Thanks.

## 2019-01-07 ENCOUNTER — Telehealth: Payer: Self-pay

## 2019-01-07 ENCOUNTER — Telehealth: Payer: Self-pay | Admitting: Cardiovascular Disease

## 2019-01-07 DIAGNOSIS — R4689 Other symptoms and signs involving appearance and behavior: Secondary | ICD-10-CM

## 2019-01-07 NOTE — Telephone Encounter (Signed)
Spoke with patient who confirmed all demographics. He is actively on My Chart and does not have a smart phone.  He prefers virtual phone visit. Will have vitals ready for visit.

## 2019-01-07 NOTE — Telephone Encounter (Signed)
Pt's son Brad James left a vm msg. Pt continues to have severe episode of hallucinations. Brad James is requesting pt to see a specialist at [facility] [place] advise, thanks.

## 2019-01-08 NOTE — Telephone Encounter (Signed)
Referral order is in Would advise patient come see me in office NOT VIRTUAL w/ his son if there is any concern, we may be able to get a good preliminary workup before he sees the specialists

## 2019-01-08 NOTE — Telephone Encounter (Signed)
Left a detailed vm msg for pt's son Legrand Como regarding provider's note. Call back info provided for appt scheduling.

## 2019-01-13 ENCOUNTER — Ambulatory Visit (INDEPENDENT_AMBULATORY_CARE_PROVIDER_SITE_OTHER): Payer: PRIVATE HEALTH INSURANCE | Admitting: Osteopathic Medicine

## 2019-01-13 ENCOUNTER — Ambulatory Visit: Payer: PRIVATE HEALTH INSURANCE | Admitting: Osteopathic Medicine

## 2019-01-13 ENCOUNTER — Encounter: Payer: Self-pay | Admitting: Osteopathic Medicine

## 2019-01-13 ENCOUNTER — Other Ambulatory Visit: Payer: Self-pay

## 2019-01-13 VITALS — BP 140/61 | HR 64 | Temp 97.8°F | Wt 188.9 lb

## 2019-01-13 DIAGNOSIS — R4689 Other symptoms and signs involving appearance and behavior: Secondary | ICD-10-CM | POA: Diagnosis not present

## 2019-01-13 MED ORDER — ZOLPIDEM TARTRATE 5 MG PO TABS: 2.5000 mg | ORAL_TABLET | Freq: Every evening | ORAL | 0 refills | Status: AC | PRN

## 2019-01-13 NOTE — Patient Instructions (Addendum)
Plan: Let's come off the Ambien!  I sent Rx for 5 mg tablets Can take 1/2 tablet (2.5 mg) tablet for several days and then stop  I'm here for you and for your family anytime you need me!

## 2019-01-13 NOTE — Progress Notes (Signed)
HPI: Brad James is a 85 y.o. male who  has a past medical history of CAD (coronary artery disease) (01/04/2014), Cancer [facility]), Carotid artery stenosis (01/04/2014), Dizzy spells, Essential hypertension, benign (01/04/2014), Glaucoma, Hyperlipidemia (01/04/2014), Hypertension, and Insomnia (01/04/2014).  he presents to [facility] today, 01/13/19,  for chief complaint of:  Mental status changes  We got a call few weeks ago 12/23/2018 concern for confusion of sudden onset, advised ER to family. No record of him going. Son called again 01/07/2019 reporting continued "hallucinations" and requesting referral, which was placed but I advised he come in for evaluation by me.   Pt reports he is doing fine, his kids are worried over nothing but he may have been a little loopy late one night d/t ambien. Denies falls. Would like to stop Ambien! He has cut back to 5 mg nightly.   MMSE not terrible. Clock drawing is pretty wacky, but he is otherwise feeling fine and acting normally.   Recent labs stable.        At today's visit 01/13/19 ... PMH, PSH, FH reviewed and updated as needed.  Current medication list and allergy/intolerance hx reviewed and updated as needed. (See remainder of HPI, ROS, Phys Exam below)           ASSESSMENT/PLAN: The encounter diagnosis was Behavioral change.   Per family, but pt doesn't want me to talk to the family. Will respect his decision for now as he appears to have full capacity to make informed decisions for himself. I think stopping the Lorrin Mais makes sense.   No orders of the defined types were placed in this encounter.    Meds ordered this encounter  Medications  . zolpidem (AMBIEN) 5 MG tablet    Sig: Take 0.5 tablets (2.5 mg total) by mouth at bedtime as needed for sleep.    Dispense:  5 tablet    Refill:  0    Patient Instructions  Plan: Let's come off the Ambien!  I sent Rx for 5 mg tablets Can take 1/2  tablet (2.5 mg) tablet for several days and then stop  I'm here for you and for your family anytime you need me!        Follow-up plan: Return for phone visit in one week to see how you're doing coming off the Ambien. Call me sooner if needed! .                                                 ################################################# ################################################# ################################################# #################################################    No outpatient medications have been marked as taking for the 01/13/19 encounter (Appointment) with Emeterio [name], DO.    No Known Allergies     Review of Systems:  Constitutional: No recent illness  HEENT: No  headache, no vision change  Cardiac: No  chest pain, No  pressure, No palpitations  Respiratory:  No  shortness of breath. No  Cough  Gastrointestinal: No  abdominal pain, no change on bowel habits  Neurologic: No  weakness, No  Dizziness  Psychiatric: No  concerns with depression, No  concerns with anxiety  Exam:  BP 140/61 (BP Location: Left Arm, Patient Position: Sitting, Cuff Size: Normal)   Pulse 64   Temp 97.8 F (36.6 C) (Oral)   Wt 188 lb 14.4 oz (85.7 kg)   BMI 25.62  kg/m   Constitutional: VS see above. General Appearance: alert, well-developed, well-nourished, NAD  Eyes: Normal lids and conjunctive, non-icteric sclera  Ears, Nose, Mouth, Throat: MMM, Normal external inspection ears/nares/mouth/lips/gums.  Neck: No masses, trachea midline.   Respiratory: Normal respiratory effort.   Musculoskeletal: Gait normal. Symmetric and independent movement of all extremities  Neurological: Normal balance/coordination. No tremor.  Skin: warm, dry, intact.   Psychiatric: Normal judgment/insight. Normal mood and affect. Oriented x3.       Visit summary with medication list and pertinent instructions was  printed for patient to review, patient was advised to alert [place] if any updates are needed. All questions at time of visit were answered - patient instructed to contact office with any additional concerns. ER/RTC precautions were reviewed with the patient and understanding verbalized.   Note: Total time spent 25 minutes, greater than 50% of the visit was spent face-to-face counseling and coordinating care for the following: The encounter diagnosis was Behavioral change..  Please note: voice recognition software was used to produce this document, and typos may escape review. Please contact Dr. Sheppard Coil for any needed clarifications.    Follow up plan: Return for phone visit in one week to see how you're doing coming off the Ambien. Call me sooner if needed! [name]

## 2019-01-14 ENCOUNTER — Telehealth: Payer: PRIVATE HEALTH INSURANCE | Admitting: Cardiovascular Disease

## 2019-01-28 ENCOUNTER — Other Ambulatory Visit: Payer: Self-pay | Admitting: Osteopathic Medicine

## 2019-01-30 ENCOUNTER — Other Ambulatory Visit: Payer: Self-pay | Admitting: Osteopathic Medicine

## 2019-03-10 ENCOUNTER — Telehealth: Payer: Self-pay | Admitting: Family Medicine

## 2019-03-10 DIAGNOSIS — M10042 Idiopathic gout, left hand: Secondary | ICD-10-CM

## 2019-03-10 MED ORDER — ALLOPURINOL 100 MG PO TABS: 100.0000 mg | ORAL_TABLET | Freq: Every day | ORAL | 0 refills | Status: AC

## 2019-03-10 NOTE — Telephone Encounter (Signed)
Received refill request for allopurinol.  Will refill at 100 mg but patient will need schedule a phone or video visit.  He will likely need labs as well in near future.

## 2019-03-10 NOTE — Telephone Encounter (Signed)
Please call to schedule

## 2019-03-10 NOTE — Telephone Encounter (Signed)
Attempted to reach patient to leave information below but patient line had a busy dial and no one answered.

## 2019-03-23 ENCOUNTER — Encounter: Payer: Self-pay | Admitting: Osteopathic Medicine

## 2019-03-23 ENCOUNTER — Other Ambulatory Visit: Payer: Self-pay

## 2019-03-23 MED ORDER — LISINOPRIL 20 MG PO TABS: 20.0000 mg | ORAL_TABLET | Freq: Two times a day (BID) | ORAL | 0 refills | Status: AC
